# Patient Record
Sex: Female | Born: 1970 | Race: White | Hispanic: No | Marital: Married | Smoking: Never smoker
Health system: Southern US, Community
[De-identification: ages and names within clinical notes are randomized; demographics above are authoritative.]

## PROBLEM LIST (undated history)

## (undated) DIAGNOSIS — K649 Unspecified hemorrhoids: Secondary | ICD-10-CM

## (undated) DIAGNOSIS — J45909 Unspecified asthma, uncomplicated: Secondary | ICD-10-CM

## (undated) DIAGNOSIS — E236 Other disorders of pituitary gland: Secondary | ICD-10-CM

## (undated) DIAGNOSIS — Z3009 Encounter for other general counseling and advice on contraception: Secondary | ICD-10-CM

## (undated) DIAGNOSIS — K219 Gastro-esophageal reflux disease without esophagitis: Secondary | ICD-10-CM

## (undated) DIAGNOSIS — T7840XA Allergy, unspecified, initial encounter: Secondary | ICD-10-CM

## (undated) DIAGNOSIS — R519 Headache, unspecified: Secondary | ICD-10-CM

## (undated) DIAGNOSIS — K589 Irritable bowel syndrome without diarrhea: Secondary | ICD-10-CM

## (undated) DIAGNOSIS — E079 Disorder of thyroid, unspecified: Secondary | ICD-10-CM

## (undated) DIAGNOSIS — M797 Fibromyalgia: Secondary | ICD-10-CM

## (undated) DIAGNOSIS — L600 Ingrowing nail: Secondary | ICD-10-CM

## (undated) DIAGNOSIS — R011 Cardiac murmur, unspecified: Secondary | ICD-10-CM

## (undated) HISTORY — DX: Cardiac murmur, unspecified: R01.1

## (undated) HISTORY — DX: Encounter for other general counseling and advice on contraception: Z30.09

## (undated) HISTORY — DX: Irritable bowel syndrome, unspecified: K58.9

## (undated) HISTORY — DX: Unspecified asthma, uncomplicated: J45.909

## (undated) HISTORY — DX: Ingrowing nail: L60.0

## (undated) HISTORY — PX: LAPAROSCOPIC TOTAL HYSTERECTOMY: SUR800

## (undated) HISTORY — DX: Disorder of thyroid, unspecified: E07.9

## (undated) HISTORY — DX: Fibromyalgia: M79.7

## (undated) HISTORY — PX: ABDOMINAL HYSTERECTOMY: SHX81

## (undated) HISTORY — DX: Gastro-esophageal reflux disease without esophagitis: K21.9

## (undated) HISTORY — DX: Allergy, unspecified, initial encounter: T78.40XA

## (undated) HISTORY — PX: KNEE ARTHROSCOPY: SUR90

## (undated) HISTORY — DX: Unspecified hemorrhoids: K64.9

---

## 2005-12-21 ENCOUNTER — Ambulatory Visit: Payer: Self-pay | Admitting: Unknown Physician Specialty

## 2008-03-11 ENCOUNTER — Inpatient Hospital Stay: Payer: Self-pay

## 2008-10-06 ENCOUNTER — Ambulatory Visit: Payer: Self-pay | Admitting: Family Medicine

## 2010-08-25 ENCOUNTER — Inpatient Hospital Stay: Payer: Self-pay | Admitting: Obstetrics & Gynecology

## 2012-08-09 ENCOUNTER — Ambulatory Visit: Payer: Self-pay | Admitting: Orthopedic Surgery

## 2012-09-18 ENCOUNTER — Ambulatory Visit: Payer: Self-pay | Admitting: Orthopedic Surgery

## 2013-02-17 ENCOUNTER — Ambulatory Visit: Payer: Self-pay | Admitting: Obstetrics and Gynecology

## 2013-08-29 LAB — HM PAP SMEAR: HM Pap smear: NEGATIVE

## 2013-11-07 ENCOUNTER — Emergency Department: Payer: Self-pay | Admitting: Emergency Medicine

## 2013-11-11 ENCOUNTER — Ambulatory Visit: Payer: Self-pay | Admitting: Family Medicine

## 2013-12-23 LAB — LIPID PANEL
CHOLESTEROL: 200 mg/dL (ref 0–200)
HDL: 60 mg/dL (ref 35–70)
LDL Cholesterol: 121 mg/dL
Triglycerides: 94 mg/dL (ref 40–160)

## 2013-12-23 LAB — HEPATIC FUNCTION PANEL
ALT: 11 U/L (ref 7–35)
AST: 17 U/L (ref 13–35)

## 2013-12-23 LAB — CBC AND DIFFERENTIAL
HCT: 38 % (ref 36–46)
HEMOGLOBIN: 12.9 g/dL (ref 12.0–16.0)
Platelets: 213 10*3/uL (ref 150–399)
WBC: 4.2 10*3/mL

## 2013-12-23 LAB — BASIC METABOLIC PANEL
BUN: 13 mg/dL (ref 4–21)
Creatinine: 0.8 mg/dL (ref 0.5–1.1)
GLUCOSE: 79 mg/dL
POTASSIUM: 4.5 mmol/L (ref 3.4–5.3)
Sodium: 137 mmol/L (ref 137–147)

## 2013-12-23 LAB — TSH: TSH: 4.13 u[IU]/mL (ref 0.41–5.90)

## 2014-02-18 ENCOUNTER — Ambulatory Visit: Payer: Self-pay | Admitting: Obstetrics & Gynecology

## 2014-02-18 LAB — CBC
HCT: 37.6 % (ref 35.0–47.0)
HGB: 13 g/dL (ref 12.0–16.0)
MCH: 30.3 pg (ref 26.0–34.0)
MCHC: 34.6 g/dL (ref 32.0–36.0)
MCV: 87 fL (ref 80–100)
PLATELETS: 205 10*3/uL (ref 150–440)
RBC: 4.3 10*6/uL (ref 3.80–5.20)
RDW: 12.3 % (ref 11.5–14.5)
WBC: 5.2 10*3/uL (ref 3.6–11.0)

## 2014-02-26 ENCOUNTER — Ambulatory Visit: Payer: Self-pay | Admitting: Obstetrics & Gynecology

## 2014-03-01 LAB — PATHOLOGY REPORT

## 2014-06-28 ENCOUNTER — Ambulatory Visit: Payer: Self-pay | Admitting: Podiatry

## 2014-07-02 ENCOUNTER — Ambulatory Visit: Payer: 59 | Admitting: Podiatry

## 2014-07-09 ENCOUNTER — Encounter: Payer: Self-pay | Admitting: Podiatry

## 2014-07-09 ENCOUNTER — Ambulatory Visit (INDEPENDENT_AMBULATORY_CARE_PROVIDER_SITE_OTHER): Payer: 59 | Admitting: Podiatry

## 2014-07-09 ENCOUNTER — Ambulatory Visit (INDEPENDENT_AMBULATORY_CARE_PROVIDER_SITE_OTHER): Payer: 59

## 2014-07-09 VITALS — BP 105/63 | HR 79 | Resp 16 | Ht 64.0 in | Wt 129.0 lb

## 2014-07-09 DIAGNOSIS — M21611 Bunion of right foot: Secondary | ICD-10-CM

## 2014-07-09 DIAGNOSIS — M21619 Bunion of unspecified foot: Secondary | ICD-10-CM

## 2014-07-09 DIAGNOSIS — M2020 Hallux rigidus, unspecified foot: Secondary | ICD-10-CM

## 2014-07-09 DIAGNOSIS — M79609 Pain in unspecified limb: Secondary | ICD-10-CM

## 2014-07-09 DIAGNOSIS — M2021 Hallux rigidus, right foot: Secondary | ICD-10-CM

## 2014-07-09 DIAGNOSIS — L600 Ingrowing nail: Secondary | ICD-10-CM

## 2014-07-09 NOTE — Progress Notes (Signed)
   Subjective:    Patient ID: Brenda Price, female    DOB: May 14, 1971, 43 y.o.   MRN: [id number]  HPI Comments: Right great toenail seems to be a sore and painful. Right great medial corner has been removed before , seems to hurt more as i am on it, closed in shoes seem to aggravate it. It has been bothering me since October of last year, it has got worse   Foot Pain Associated symptoms include headaches.      Review of Systems  HENT:       Sinus problems  Sore throat  Ringing in ears   Eyes: Positive for pain, redness and itching.  Respiratory: Positive for chest tightness.   Gastrointestinal:       Bloating   Constipation   Allergic/Immunologic: Positive for environmental allergies.  Neurological: Positive for headaches.  All other systems reviewed and are negative.      Objective:   Physical Exam        Assessment & Plan:

## 2014-07-09 NOTE — Progress Notes (Signed)
Subjective:     Patient ID: Brenda Price, female   DOB: 09-25-1971, 43 y.o.   MRN: [id number]  Foot Pain   patient presents stating that she had an ingrown toenail removed in the last year and that it's been giving her trouble since and she just seems to get pain around the toe in big toe joint  Review of Systems  All other systems reviewed and are negative.      Objective:   Physical Exam  Nursing note and vitals reviewed. Constitutional: She is oriented to person, place, and time.  Cardiovascular: Intact distal pulses.   Musculoskeletal: Normal range of motion.  Neurological: She is oriented to person, place, and time.  Skin: Skin is warm.   neurovascular status intact with muscle strength adequate and range of motion subtalar and midtarsal joint within normal limits. Patient's found to have an incurvated medial border right hallux was in thick tissue formation and obvious scar tissue and it is moderately discomforting when pressed and also discomfort in the first metatarsophalangeal joint lateral side with indications of functional or structural hallux limitus deformity. Digits are found to be well perfused and patient is well oriented x3     Assessment:     Probable damage to the right hallux medial border with scar tissue formation creating pain and possibility that part of the pain is related to discomfort in the metatarsal phalangeal joint    Plan:     H&P and x-rays reviewed. Proximal nerve block of the right toe 60 mg Xylocaine Marcaine mixture was administered and discussed and explained risk associated with removing this corner again. Patient wants procedure and today I removed the right hallux medial border exposed the matrix and apply chemical phenol 3 applications followed by alcohol lavaged and sterile dressing. Gave instructions for soaks and reappoint and we are going to consider injection of the big toe joint and possible other treatments depending on response

## 2014-07-09 NOTE — Patient Instructions (Signed)

## 2014-08-10 ENCOUNTER — Ambulatory Visit (INDEPENDENT_AMBULATORY_CARE_PROVIDER_SITE_OTHER): Payer: 59 | Admitting: Podiatry

## 2014-08-10 ENCOUNTER — Encounter: Payer: Self-pay | Admitting: Podiatry

## 2014-08-10 VITALS — BP 123/75 | HR 76 | Resp 16

## 2014-08-10 DIAGNOSIS — L03039 Cellulitis of unspecified toe: Secondary | ICD-10-CM

## 2014-08-10 MED ORDER — CEPHALEXIN 500 MG PO CAPS: 500.0000 mg | ORAL_CAPSULE | Freq: Two times a day (BID) | ORAL | Status: DC

## 2014-08-10 NOTE — Progress Notes (Signed)
Subjective:     Patient ID: Brenda Price, female   DOB: 1971-01-26, 43 y.o.   MRN: [id number]  HPI patient states that I had this ingrown toenail removed about a month ago and I was just concerned because it started to turn red and draining at the bottom portion of it. I haven't noticed anything else currently   Review of Systems     Objective:   Physical Exam Neurovascular status intact with patient well oriented x3 and noted to have a small amount of crusted tissue in the proximal portion of the right hallux where remove the nail corner. It is localized with no proximal edema erythema or drainage noted. The pain she was exhibiting prior to surgery has resolved    Assessment:     Possible localized paronychia secondary to the area healing and a small amount of bacteria which may have incurvated this area local    Plan:     Educated patient on this and placed on cephalexin 500 mg twice a day and gave instructions on soaks. If symptoms were to persist or get worse she is to let us know immediately

## 2014-08-20 ENCOUNTER — Ambulatory Visit: Payer: 59 | Admitting: Podiatry

## 2014-11-09 ENCOUNTER — Ambulatory Visit (INDEPENDENT_AMBULATORY_CARE_PROVIDER_SITE_OTHER): Payer: 59

## 2014-11-09 ENCOUNTER — Ambulatory Visit (INDEPENDENT_AMBULATORY_CARE_PROVIDER_SITE_OTHER): Payer: 59 | Admitting: Podiatry

## 2014-11-09 VITALS — BP 113/64 | HR 75 | Resp 16

## 2014-11-09 DIAGNOSIS — G90521 Complex regional pain syndrome I of right lower limb: Secondary | ICD-10-CM

## 2014-11-09 DIAGNOSIS — M779 Enthesopathy, unspecified: Secondary | ICD-10-CM

## 2014-11-09 MED ORDER — GABAPENTIN 300 MG PO CAPS: 300.0000 mg | ORAL_CAPSULE | Freq: Two times a day (BID) | ORAL | Status: DC

## 2014-11-10 NOTE — Progress Notes (Signed)
Subjective:     Patient ID: Brenda Price, female   DOB: 05-05-1971, 43 y.o.   MRN: [id number]  HPI patient states that if she exercises or is real active on her foot that her toes turn red and become moderately irritated and it is slightly limiting for her. The ingrown toenail that we fixed is doing fine and no longer giving her any pain and she does have a history of a traumatic ankle sprain right and had developed this persistent-type pain after she was immobilized which was approximately 1 year ago   Review of Systems     Objective:   Physical Exam Neurovascular status was intact with no skin mottling noted no abnormal discoloration or clonus to the digits noted. 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 She had been on low dosage gabapentin which was effective for her previously and I have recommended we resume this and hopefully we can get her off of it within a couple months. I explained the symptoms and signs of RSD S and if any worsening or other issues should occur I like to know right away and at this time I placed her on 300 mg gabapentin twice a day

## 2015-03-05 NOTE — Op Note (Signed)
PATIENT NAME:  Brenda Price, Brenda Price MR#:  [id number] DATE OF BIRTH:  1971/02/29  DATE OF PROCEDURE:  02/26/2014  PREOPERATIVE DIAGNOSIS: Abnormal uterine bleeding.   POSTOPERATIVE DIAGNOSIS: Abnormal uterine bleeding.   PROCEDURE: Complete laparoscopic hysterectomy, bilateral salpingectomy.   SURGEON: Glean Salen, MD  ASSISTANT: Stoney Bang. Georgianne Fick, MD   ANESTHESIA: General.   ESTIMATED BLOOD LOSS: 100 mL.   COMPLICATIONS: None.   FINDINGS: Normal ovaries.   DISPOSITION: To recovery room stable.   TECHNIQUE: The patient is prepped and draped in the usual sterile fashion after adequate anesthesia is obtained in the dorsal lithotomy position. A Foley catheter is inserted. A V-Care device is placed on the cervix for manipulation purposes and identification of the cervix during the procedure.   Attention is then turned to abdomen, where an infraumbilical incision is made down to the level of the rectus fascia. Veress needle is inserted, with Veress needle placement confirmed using the hanging drop technique. The abdomen is then insufflated with CO2 gas. The fascia was then tented anteriorly and incised using Metzenbaum scissors. Careful examination reveals no adhesions or bowel stuck in this area. The Billy Fischer is then placed through this fascial incision into the intra-abdominal cavity for use for single-site laparoscopic procedure. Once it is cinched into place, the cap is applied that has multiple ports for laparoscopic instrumentation. A 5 mm laparoscope that has a flexible head is then inserted to visualize the intra-abdominal cavity and its organs. The patient is placed in Trendelenburg positioning, and the above-mentioned findings are visualized.   The laparoscopic instruments were placed, and the right fallopian tube is grasped and carefully dissected free using the 5 mm Harmonic scalpel down to the level of the uterus. The uterine ovarian blood vessels and ligaments are then carefully  cut, with preservation of the ovary and its main blood supply. The round ligament is also coagulated and cut, and dissection is carried down to the level of the uterine arteries. The uterine arteries were coagulated with bipolar Kleppinger device. The left fallopian tube is grasped and also dissected free using the 5 mm Harmonic scalpel, and then the uterine ovarian blood vessels and their ligaments along with the round ligaments and broad ligament complex are all coagulated and cut using the Harmonic scalpel down to the level of the uterine arteries. The left ovary is preserved along with its blood supply. The uterine arteries are then coagulated and cut, and the uterus appears to blanch. The cervicovaginal junction is identified with tenting of the tissues using the V-Care device from below, and a careful circumferential incision is made with the Harmonic scalpel to completely amputate the uterus along with the cervix. Once this is completed, it is removed vaginally, and a sponge is placed in the vagina to maintain the pneumoperitoneum.   Laparoscopic sutures applied using a 0 Polysorb suture across the vaginal cuff in a running fashion. The pelvic cavity is irrigated with aspiration of blood clot, and excellent hemostasis is noted at all sites. There is no apparent injury to bladder, bowel, ureter or colon. Appendix, gallbladder and liver are also visualized to be normal. The patient is leveled, gas is expelled and the trocars removed through the umbilical site. The rectus fascia is closed with 0 Vicryl suture. A 4-0 Vicryl suture is used to close the subcutaneous and subcuticular layer, and then Dermabond is applied to the skin, followed by a bandage.   Vaginal exam is performed with a speculum, and the vaginal mucosa is further closed  with a 2-0 Vicryl suture in a running fashion across the cuff for complete mucosal closure. Vaginal cavity is irrigated, and no bleeding is noted. Foley catheter is left in  place, and the patient goes to recovery in stable condition. All sponge, instrument and needle counts are correct.   ____________________________ R. Barnett Applebaum, MD rph:lb D: 02/26/2014 13:25:12 ET T: 02/26/2014 13:56:17 ET JOB#: [id number]  cc: Glean Salen, MD, <Dictator> Gae Dry MD ELECTRONICALLY SIGNED 02/27/2014 8:56

## 2015-04-21 ENCOUNTER — Other Ambulatory Visit: Payer: Self-pay | Admitting: Family Medicine

## 2015-04-21 DIAGNOSIS — E039 Hypothyroidism, unspecified: Secondary | ICD-10-CM

## 2015-05-16 ENCOUNTER — Other Ambulatory Visit: Payer: Self-pay | Admitting: Family Medicine

## 2015-08-29 ENCOUNTER — Encounter: Payer: Self-pay | Admitting: Family Medicine

## 2015-08-29 ENCOUNTER — Telehealth: Payer: Self-pay | Admitting: Family Medicine

## 2015-08-29 ENCOUNTER — Ambulatory Visit (INDEPENDENT_AMBULATORY_CARE_PROVIDER_SITE_OTHER): Payer: 59 | Admitting: Family Medicine

## 2015-08-29 VITALS — BP 108/74 | HR 66 | Temp 97.7°F | Resp 16 | Ht 64.0 in | Wt 131.0 lb

## 2015-08-29 DIAGNOSIS — J45901 Unspecified asthma with (acute) exacerbation: Secondary | ICD-10-CM | POA: Diagnosis not present

## 2015-08-29 DIAGNOSIS — R42 Dizziness and giddiness: Secondary | ICD-10-CM

## 2015-08-29 DIAGNOSIS — J45909 Unspecified asthma, uncomplicated: Secondary | ICD-10-CM | POA: Insufficient documentation

## 2015-08-29 MED ORDER — MECLIZINE HCL 25 MG PO TABS: 25.0000 mg | ORAL_TABLET | Freq: Three times a day (TID) | ORAL | Status: DC | PRN

## 2015-08-29 MED ORDER — MECLIZINE HCL 32 MG PO TABS: 32.0000 mg | ORAL_TABLET | Freq: Three times a day (TID) | ORAL | Status: DC | PRN

## 2015-08-29 MED ORDER — PREDNISONE 10 MG PO TABS: ORAL_TABLET | ORAL | Status: DC

## 2015-08-29 NOTE — Telephone Encounter (Signed)
Please call. Sent in new rx. Thanks.

## 2015-08-29 NOTE — Progress Notes (Signed)
Subjective:    Patient ID: Brenda Price, female    DOB: 1971/10/31, 44 y.o.   MRN: [id number]  Shortness of Breath This is a recurrent problem. The current episode started in the past 7 days. The problem occurs intermittently. The problem has been unchanged. Associated symptoms include abdominal pain, ear pain, headaches, leg pain ("achy"), neck pain, rhinorrhea and a sore throat. Pertinent negatives include no chest pain, claudication, fever, hemoptysis, leg swelling, orthopnea, rash, sputum production, swollen glands, syncope, vomiting or wheezing. Treatments tried: Benadryl, [employer]. The treatment provided mild relief. Her past medical history is significant for allergies, asthma and pneumonia.  Dizziness The current episode started more than 1 year ago. The problem has been gradually worsening (Feels like has to hold on stainding and talking. Comes and goes.  ). Associated symptoms include abdominal pain, arthralgias, chills, congestion, coughing, fatigue, headaches, myalgias, nausea, neck pain, a sore throat, vertigo and weakness. Pertinent negatives include no anorexia, change in bowel habit, chest pain, diaphoresis, fever, joint swelling, numbness, rash, swollen glands, urinary symptoms, visual change or vomiting. The symptoms are aggravated by standing (Worse this week Feels like room is spinning. ). She has tried nothing for the symptoms.      Review of Systems  Constitutional: Positive for chills and fatigue. Negative for fever and diaphoresis.  HENT: Positive for congestion, ear pain, rhinorrhea and sore throat.   Respiratory: Positive for cough and shortness of breath. Negative for hemoptysis, sputum production and wheezing.   Cardiovascular: Negative for chest pain, orthopnea, claudication, leg swelling and syncope.  Gastrointestinal: Positive for nausea and abdominal pain. Negative for vomiting, anorexia and change in bowel habit.  Musculoskeletal: Positive for myalgias,  arthralgias and neck pain. Negative for joint swelling.  Skin: Negative for rash.  Neurological: Positive for dizziness, vertigo, weakness and headaches. Negative for numbness.   BP 108/74 mmHg  Pulse 66  Temp(Src) 97.7 F (36.5 C) (Oral)  Resp 16  Ht 5\' 4"  (1.626 m)  Wt 131 lb (59.421 kg)  BMI 22.47 kg/m2  SpO2 99%   There are no active problems to display for this patient.  Past Medical History  Diagnosis Date  . Asthma   . Thyroid disease   . Allergy   . IBS (irritable bowel syndrome)   . Heart murmur   . Fibromyalgia    Current Outpatient Prescriptions on File Prior to Visit  Medication Sig  . cetirizine (ZYRTEC) 10 MG tablet Take 10 mg by mouth daily.  . cyclobenzaprine (FLEXERIL) 5 MG tablet   . levocetirizine (XYZAL) 5 MG tablet Take 2.5 mg by mouth every evening.  Marland Kitchen levothyroxine (SYNTHROID, LEVOTHROID) 50 MCG tablet Take 1 tablet by mouth  daily  . montelukast (SINGULAIR) 10 MG tablet Take 1 tablet by mouth  daily  . PROAIR HFA 108 (90 BASE) MCG/ACT inhaler    No current facility-administered medications on file prior to visit.   Allergies  Allergen Reactions  . Aspirin Other (See Comments)    Causes asthma to act up   . Erythromycin Other (See Comments)    Stomach cramping    Past Surgical History  Procedure Laterality Date  . Abdominal hysterectomy    . Knee arthroscopy    . Cesarean section     Social History   Social History  . Marital Status: Married    Spouse Name: N/A  . Number of Children: N/A  . Years of Education: N/A   Occupational History  . Not on file.  Social History Main Topics  . Smoking status: Never Smoker   . Smokeless tobacco: Never Used  . Alcohol Use: Yes     Comment: occasional  . Drug Use: No  . Sexual Activity: Not on file   Other Topics Concern  . Not on file   Social History Narrative   Family History  Problem Relation Age of Onset  . Heart attack Father   . Arthritis Mother   . Hyperlipidemia Mother    . Thyroid disease Brother      .result     Objective:   Physical Exam  Constitutional: She is oriented to person, place, and time. She appears well-developed and well-nourished.  HENT:  Head: Normocephalic and atraumatic.  Right Ear: External ear normal.  Left Ear: External ear normal.  Mouth/Throat: Oropharynx is clear and moist.  Eyes: Conjunctivae and EOM are normal. Pupils are equal, round, and reactive to light.  Neck: Normal range of motion. Neck supple.  Cardiovascular: Normal rate and regular rhythm.   Pulmonary/Chest: Effort normal and breath sounds normal.  Neurological: She is alert and oriented to person, place, and time. No cranial nerve deficit. Coordination normal.  Can not reproduce dizziness with head maneuvers.   Psychiatric: She has a normal mood and affect. Her behavior is normal. Judgment and thought content normal.    BP 108/74 mmHg  Pulse 66  Temp(Src) 97.7 F (36.5 C) (Oral)  Resp 16  Ht 5\' 4"  (1.626 m)  Wt 131 lb (59.421 kg)  BMI 22.47 kg/m2  SpO2 99%       Assessment & Plan:  1. Asthma exacerbation New problem.  Will treat. Call if not improving, especially if fever or other systemic symptoms.   - predniSONE (DELTASONE) 10 MG tablet; 6 po for 2 days and then 5 po for 2 days and then 4 po for 2 days and 3 po for 2 days and then 2 po for 2 days and then 1 po for 2 days.  Dispense: 42 tablet; Refill: 0  2. Vertigo New problem.  Will treat as below. If does not improve with steroids or worsens, will need referral to ENT.  Patient will call if needed   - meclizine (ANTIVERT) 32 MG tablet; Take 1 tablet (32 mg total) by mouth 3 (three) times daily as needed.  Dispense: 30 tablet; Refill: 0   Margarita Rana, MD

## 2015-08-29 NOTE — Telephone Encounter (Signed)
Brenda Price stated that the meclizine (ANTIVERT) 32 MG tablet doesn't come in 32 mg it only comes in 12.5 or 25 mg. Brenda Price would like a call back to be advise which dose Dr. Venia Minks would  like it changed too. Thanks TNP

## 2015-09-29 DIAGNOSIS — N920 Excessive and frequent menstruation with regular cycle: Secondary | ICD-10-CM | POA: Insufficient documentation

## 2015-09-29 DIAGNOSIS — K219 Gastro-esophageal reflux disease without esophagitis: Secondary | ICD-10-CM | POA: Insufficient documentation

## 2015-09-29 DIAGNOSIS — L600 Ingrowing nail: Secondary | ICD-10-CM

## 2015-09-29 DIAGNOSIS — R011 Cardiac murmur, unspecified: Secondary | ICD-10-CM

## 2015-09-29 DIAGNOSIS — J302 Other seasonal allergic rhinitis: Secondary | ICD-10-CM | POA: Insufficient documentation

## 2015-09-29 DIAGNOSIS — Z3009 Encounter for other general counseling and advice on contraception: Secondary | ICD-10-CM

## 2015-09-29 DIAGNOSIS — I341 Nonrheumatic mitral (valve) prolapse: Secondary | ICD-10-CM | POA: Insufficient documentation

## 2015-09-29 DIAGNOSIS — K599 Functional intestinal disorder, unspecified: Secondary | ICD-10-CM | POA: Insufficient documentation

## 2015-09-29 DIAGNOSIS — K589 Irritable bowel syndrome without diarrhea: Secondary | ICD-10-CM | POA: Insufficient documentation

## 2015-09-29 DIAGNOSIS — K649 Unspecified hemorrhoids: Secondary | ICD-10-CM | POA: Insufficient documentation

## 2015-09-29 DIAGNOSIS — B351 Tinea unguium: Secondary | ICD-10-CM | POA: Insufficient documentation

## 2015-09-29 DIAGNOSIS — G43909 Migraine, unspecified, not intractable, without status migrainosus: Secondary | ICD-10-CM | POA: Insufficient documentation

## 2015-09-29 DIAGNOSIS — M542 Cervicalgia: Secondary | ICD-10-CM | POA: Insufficient documentation

## 2015-09-29 DIAGNOSIS — D709 Neutropenia, unspecified: Secondary | ICD-10-CM | POA: Insufficient documentation

## 2015-09-29 DIAGNOSIS — E789 Disorder of lipoprotein metabolism, unspecified: Secondary | ICD-10-CM | POA: Insufficient documentation

## 2015-09-29 DIAGNOSIS — E039 Hypothyroidism, unspecified: Secondary | ICD-10-CM | POA: Insufficient documentation

## 2015-09-29 HISTORY — DX: Encounter for other general counseling and advice on contraception: Z30.09

## 2015-09-29 HISTORY — DX: Ingrowing nail: L60.0

## 2015-09-29 HISTORY — DX: Unspecified hemorrhoids: K64.9

## 2015-09-29 HISTORY — DX: Cardiac murmur, unspecified: R01.1

## 2015-10-04 ENCOUNTER — Ambulatory Visit (INDEPENDENT_AMBULATORY_CARE_PROVIDER_SITE_OTHER): Payer: 59 | Admitting: Family Medicine

## 2015-10-04 ENCOUNTER — Other Ambulatory Visit: Payer: Self-pay

## 2015-10-04 VITALS — BP 102/70 | HR 68 | Temp 98.5°F | Resp 16 | Ht 64.0 in | Wt 133.0 lb

## 2015-10-04 DIAGNOSIS — E039 Hypothyroidism, unspecified: Secondary | ICD-10-CM | POA: Diagnosis not present

## 2015-10-04 DIAGNOSIS — G90521 Complex regional pain syndrome I of right lower limb: Secondary | ICD-10-CM

## 2015-10-04 DIAGNOSIS — E789 Disorder of lipoprotein metabolism, unspecified: Secondary | ICD-10-CM

## 2015-10-04 DIAGNOSIS — Z0000 Encounter for general adult medical examination without abnormal findings: Secondary | ICD-10-CM

## 2015-10-04 MED ORDER — GABAPENTIN 300 MG PO CAPS: 300.0000 mg | ORAL_CAPSULE | Freq: Two times a day (BID) | ORAL | Status: DC

## 2015-10-04 NOTE — Progress Notes (Signed)
Patient: Brenda Price, Female    DOB: Oct 14, 1971, 44 y.o.   MRN: [id number] Visit Date: 10/04/2015  Today's Provider: Margarita Rana, MD   Chief Complaint  Patient presents with  . Annual Exam  . Foot Pain   Subjective:    Annual physical exam FLOREAN KERCHEVAL is a 44 y.o. female who presents today for health maintenance and complete physical. She feels fairly well. She reports exercising 3-5 days weekly; treadmill and yoga. She reports she is sleeping well.  Last Pap- 08/19/2013- Negative. HPV negative Last mammogram- thru [facility]. Normal per pt -----------------------------------------------------------------  Foot pain Pt injured right foot about 3 years ago. Pt sprained ankle, and still is experiencing residual pain from the injury. Pt reports podiatry started pt on Gabapentin (unknown dose), with relief. Pt is requesting for PCP to refill this medication.   Review of Systems  Constitutional: Positive for fatigue.  HENT: Positive for postnasal drip, rhinorrhea and sinus pressure.   Eyes: Positive for redness and itching.  Respiratory: Positive for chest tightness and shortness of breath.   Gastrointestinal: Positive for nausea, constipation and abdominal distention.  Musculoskeletal: Positive for myalgias, arthralgias, neck pain and neck stiffness.  Allergic/Immunologic: Positive for environmental allergies.  Neurological: Positive for dizziness and headaches.  All other systems reviewed and are negative.   Social History She  reports that she has never smoked. She has never used smokeless tobacco. She reports that she drinks alcohol. She reports that she does not use illicit drugs. Social History   Social History  . Marital Status: Married    Spouse Name: N/A  . Number of Children: N/A  . Years of Education: N/A   Social History Main Topics  . Smoking status: Never Smoker   . Smokeless tobacco: Never Used  . Alcohol Use: Yes     Comment: occasional    . Drug Use: No  . Sexual Activity: Not on file   Other Topics Concern  . Not on file   Social History Narrative    Patient Active Problem List   Diagnosis Date Noted  . 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 Family planning 09/29/2015  . Acid reflux 09/29/2015  . Cardiac murmur 09/29/2015  . Excess, menstruation 09/29/2015  . Hemorrhoid 09/29/2015  . 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 Embedded toenail 09/29/2015  . Irregular bleeding 09/29/2015  . Headache, migraine 09/29/2015  . 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 Cervical pain 09/29/2015  . Neutropenia ([place]) 09/29/2015  . Fungal infection of nail 09/29/2015  . Allergic rhinitis, seasonal 09/29/2015  . Asthma exacerbation 08/29/2015    Past Surgical History  Procedure Laterality Date  . Abdominal hysterectomy    . Knee arthroscopy    . Cesarean section      Family History  Family Status  Relation Status Death Age  . Father Deceased   . Mother Alive   . Sister Alive   . Brother Alive    Her family history includes Arthritis in her mother; Heart attack in her father; Hyperlipidemia in her mother; Thyroid disease in her brother.    Allergies  Allergen Reactions  . Aspirin Other (See Comments)    Causes asthma to act up   . Erythromycin Other (See Comments)    Stomach cramping     Previous Medications   BACLOFEN (LIORESAL) 10 MG TABLET    Take by mouth.   CETIRIZINE (ZYRTEC) 10 MG TABLET    Take  10 mg by mouth daily.   FLUVIRIN SUSP    ADM 0.5ML IM UTD   LEVOCETIRIZINE (XYZAL) 5 MG TABLET    Take 2.5 mg by mouth every evening.   LEVOTHYROXINE (SYNTHROID, LEVOTHROID) 50 MCG TABLET    Take 1 tablet by mouth  daily   MONTELUKAST (SINGULAIR) 10 MG TABLET    Take 1 tablet by mouth  daily   OMEPRAZOLE (PRILOSEC) 20 MG CAPSULE    Take 20 mg by mouth daily.   PROAIR HFA 108 (68 BASE) MCG/ACT INHALER        Patient Care Team: Margarita Rana, MD as PCP - General (Family  Medicine) Margarita Rana, MD (Family Medicine)     Objective:   Vitals: BP 102/70 mmHg  Pulse 68  Temp(Src) 98.5 F (36.9 C) (Oral)  Resp 16  Ht 5\' 4"  (1.626 m)  Wt 133 lb (60.328 kg)  BMI 22.82 kg/m2  SpO2 97%   Physical Exam  Constitutional: She is oriented to person, place, and time. She appears well-developed and well-nourished.  HENT:  Head: Normocephalic and atraumatic.  Right Ear: Tympanic membrane, external ear and ear canal normal.  Left Ear: Tympanic membrane, external ear and ear canal normal.  Nose: Nose normal.  Mouth/Throat: Uvula is midline, oropharynx is clear and moist and mucous membranes are normal.  Eyes: Conjunctivae, EOM and lids are normal. Pupils are equal, round, and reactive to light.  Neck: Trachea normal and normal range of motion. Neck supple. Carotid bruit is not present. No thyroid mass and no thyromegaly present.  Cardiovascular: Normal rate, regular rhythm and normal heart sounds.   Pulmonary/Chest: Effort normal and breath sounds normal.  Abdominal: Soft. Normal appearance and bowel sounds are normal. There is no hepatosplenomegaly. There is no tenderness.  Musculoskeletal: Normal range of motion.  Lymphadenopathy:    She has no cervical adenopathy.    She has no axillary adenopathy.  Neurological: She is alert and oriented to person, place, and time. She has normal strength. No cranial nerve deficit.  Skin: Skin is warm, dry and intact.  Psychiatric: She has a normal mood and affect. Her speech is normal and behavior is normal. Judgment and thought content normal. Cognition and memory are normal.     Depression Screen PHQ 2/9 Scores 10/04/2015  PHQ - 2 Score 0      Assessment & Plan:     Routine Health Maintenance and Physical Exam  Exercise Activities and Dietary recommendations Goals    None      Immunization History  Administered Date(s) Administered  . Influenza,inj,Quad PF,36+ Mos 09/05/2015  . Td 07/07/2002  . Tdap  07/07/2012    Health Maintenance  Topic Date Due  . HIV Screening  10/23/1987  . INFLUENZA VACCINE  06/12/2016  . PAP SMEAR  08/29/2016  . TETANUS/TDAP  07/07/2022      Discussed health benefits of physical activity, and encouraged her to engage in regular exercise appropriate for her age and condition.    --------------------------------------------------------------------  1. Annual physical exam Stable. Continue healthy lifestyle. 2. Complex regional pain syndrome of lower limb, right Start Gabapentin for foot pain as below. - gabapentin (NEURONTIN) 300 MG capsule; Take 1 capsule (300 mg total) by mouth 2 (two) times daily.  Dispense: 60 capsule; Refill: 3  3. Hypothyroidism, unspecified hypothyroidism type FU pending labs. - TSH  4. Disease of lipid metabolism FU pending labs. - CBC with Differential/Platelet - Comprehensive metabolic panel - Lipid panel  Patient seen and examined  by Jerrell Belfast, MD, and note scribed by Renaldo Fiddler, CMA.  I have reviewed the document for accuracy and completeness and I agree with above. Jerrell Belfast, MD   Margarita Rana, MD

## 2015-10-05 ENCOUNTER — Encounter: Payer: Self-pay | Admitting: Family Medicine

## 2015-10-17 ENCOUNTER — Ambulatory Visit (INDEPENDENT_AMBULATORY_CARE_PROVIDER_SITE_OTHER): Payer: 59 | Admitting: Family Medicine

## 2015-10-17 ENCOUNTER — Encounter: Payer: Self-pay | Admitting: Family Medicine

## 2015-10-17 VITALS — BP 110/72 | HR 69 | Temp 97.5°F | Resp 16 | Ht 64.0 in | Wt 134.0 lb

## 2015-10-17 DIAGNOSIS — J0100 Acute maxillary sinusitis, unspecified: Secondary | ICD-10-CM | POA: Diagnosis not present

## 2015-10-17 DIAGNOSIS — J4521 Mild intermittent asthma with (acute) exacerbation: Secondary | ICD-10-CM | POA: Diagnosis not present

## 2015-10-17 MED ORDER — AMOXICILLIN-POT CLAVULANATE 875-125 MG PO TABS: 1.0000 | ORAL_TABLET | Freq: Two times a day (BID) | ORAL | Status: DC

## 2015-10-17 MED ORDER — PREDNISONE 20 MG PO TABS: ORAL_TABLET | ORAL | Status: DC

## 2015-10-17 MED ORDER — HYDROCODONE-HOMATROPINE 5-1.5 MG/5ML PO SYRP: ORAL_SOLUTION | ORAL | Status: DC

## 2015-10-17 NOTE — Progress Notes (Signed)
Subjective:     Patient ID: Brenda Price, female   DOB: 13-Aug-1971, 44 y.o.   MRN: [id number]  HPI  Chief Complaint  Patient presents with  . Cough    Patient comes in office today with concerns of cough and congestion since 11/26. Patient states that she hass a productive cough with phlegm and she has had burning in her chest, patient reports cough has got worse since 12/1. Patient states that she has hd sinus pain and pressure but denies fever. Patient has taken otc Day/Night Quil and Robitussin  Patient reports increased sinus pressure, purulent sinus drainage, post nasal drainage and accompanying cough.States she has been using her albuterol inhaler with little improvement in her cough.   Review of Systems  Constitutional: Negative for fever and chills.       Objective:   Physical Exam  Constitutional: She appears well-developed and well-nourished. No distress.  Ears: T.M's intact without inflammation Sinuses: mild maxillary sinus tenderness Throat: no tonsillar enlargement or exudate Neck: no cervical adenopathy Lungs: clear     Assessment:    1. Acute maxillary sinusitis, recurrence not specified - amoxicillin-clavulanate (AUGMENTIN) 875-125 MG tablet; Take 1 tablet by mouth 2 (two) times daily.  Dispense: 20 tablet; Refill: 0 - HYDROcodone-homatropine (HYCODAN) 5-1.5 MG/5ML syrup; 5 ml 4-6 hours as needed for cough  Dispense: 240 mL; Refill: 0  2. Asthma, mild intermittent, with acute exacerbation - predniSONE (DELTASONE) 20 MG tablet; Taper as follows: 3 pills for 4 days, two pills for 4 days, one pill for four days  Dispense: 24 tablet; Refill: 0    Plan:    Discussed use of Mucinex D. To start prednisone for increased wheezing or shortness of breath.

## 2015-10-17 NOTE — Patient Instructions (Signed)
Discussed use of Mucinex D for congestion. 

## 2015-11-03 LAB — COMPREHENSIVE METABOLIC PANEL
A/G RATIO: 2 (ref 1.1–2.5)
ALK PHOS: 54 IU/L (ref 39–117)
ALT: 16 IU/L (ref 0–32)
AST: 20 IU/L (ref 0–40)
Albumin: 4.4 g/dL (ref 3.5–5.5)
BILIRUBIN TOTAL: 0.8 mg/dL (ref 0.0–1.2)
BUN / CREAT RATIO: 18 (ref 9–23)
BUN: 13 mg/dL (ref 6–24)
CALCIUM: 9.6 mg/dL (ref 8.7–10.2)
CO2: 25 mmol/L (ref 18–29)
CREATININE: 0.73 mg/dL (ref 0.57–1.00)
Chloride: 98 mmol/L (ref 96–106)
GFR calc Af Amer: 117 mL/min/{1.73_m2} (ref >59)
GFR, EST NON AFRICAN AMERICAN: 101 mL/min/{1.73_m2} (ref >59)
GLOBULIN, TOTAL: 2.2 g/dL (ref 1.5–4.5)
Glucose: 79 mg/dL (ref 65–99)
Potassium: 4.3 mmol/L (ref 3.5–5.2)
SODIUM: 139 mmol/L (ref 134–144)
Total Protein: 6.6 g/dL (ref 6.0–8.5)

## 2015-11-03 LAB — CBC WITH DIFFERENTIAL/PLATELET
BASOS: 1 %
Basophils Absolute: 0 10*3/uL (ref 0.0–0.2)
EOS (ABSOLUTE): 0.3 10*3/uL (ref 0.0–0.4)
EOS: 5 %
HEMATOCRIT: 39 % (ref 34.0–46.6)
Hemoglobin: 13.4 g/dL (ref 11.1–15.9)
IMMATURE GRANULOCYTES: 0 %
Immature Grans (Abs): 0 10*3/uL (ref 0.0–0.1)
LYMPHS ABS: 1.8 10*3/uL (ref 0.7–3.1)
Lymphs: 34 %
MCH: 29.9 pg (ref 26.6–33.0)
MCHC: 34.4 g/dL (ref 31.5–35.7)
MCV: 87 fL (ref 79–97)
MONOS ABS: 0.5 10*3/uL (ref 0.1–0.9)
Monocytes: 9 %
NEUTROS PCT: 51 %
Neutrophils Absolute: 2.6 10*3/uL (ref 1.4–7.0)
PLATELETS: 188 10*3/uL (ref 150–379)
RBC: 4.48 x10E6/uL (ref 3.77–5.28)
RDW: 14.2 % (ref 12.3–15.4)
WBC: 5.2 10*3/uL (ref 3.4–10.8)

## 2015-11-03 LAB — TSH: TSH: 2.05 u[IU]/mL (ref 0.450–4.500)

## 2015-11-03 LAB — LIPID PANEL
CHOL/HDL RATIO: 3.1 ratio (ref 0.0–4.4)
Cholesterol, Total: 210 mg/dL — ABNORMAL HIGH (ref 100–199)
HDL: 67 mg/dL (ref >39)
LDL CALC: 112 mg/dL — AB (ref 0–99)
Triglycerides: 157 mg/dL — ABNORMAL HIGH (ref 0–149)
VLDL CHOLESTEROL CAL: 31 mg/dL (ref 5–40)

## 2015-11-04 ENCOUNTER — Telehealth: Payer: Self-pay

## 2015-11-04 NOTE — Telephone Encounter (Signed)
Pt advised.   Thanks,   -Britain Saber  

## 2015-11-04 NOTE — Telephone Encounter (Signed)
-----   Message from Margarita Rana, MD sent at 11/03/2015  8:11 AM EST ----- Cholesterol is stable. Is mildly elevated at 210 but 10 year risk of heart disease is less than one percent. Treatment is not indicated. Thanks.

## 2016-02-02 ENCOUNTER — Ambulatory Visit (INDEPENDENT_AMBULATORY_CARE_PROVIDER_SITE_OTHER): Payer: 59 | Admitting: Physician Assistant

## 2016-02-02 ENCOUNTER — Encounter: Payer: Self-pay | Admitting: Physician Assistant

## 2016-02-02 VITALS — BP 118/70 | HR 78 | Temp 98.3°F | Resp 16 | Wt 133.0 lb

## 2016-02-02 DIAGNOSIS — T148 Other injury of unspecified body region: Secondary | ICD-10-CM

## 2016-02-02 DIAGNOSIS — W57XXXA Bitten or stung by nonvenomous insect and other nonvenomous arthropods, initial encounter: Secondary | ICD-10-CM

## 2016-02-02 MED ORDER — PREDNISONE 10 MG (21) PO TBPK: 10.0000 mg | ORAL_TABLET | Freq: Every day | ORAL | Status: DC

## 2016-02-02 NOTE — Progress Notes (Signed)
Patient: Brenda Price Female    DOB: 07-14-1971   45 y.o.   MRN: [id number] Visit Date: 02/02/2016  Today's Provider: Mar Daring, PA-C   Chief Complaint  Patient presents with  . Rash   Subjective:    Rash This is a new problem. The current episode started in the past 7 days (Sunday night). The problem is unchanged. The affected locations include the face, neck, left arm, right arm, left foot, right foot, left toes and right lowerleg. The rash is characterized by burning, itchiness, redness and peeling. Associated with: She was out in wood camping Saturday and Sunday. Associated symptoms include congestion. Pertinent negatives include no cough, eye pain, fatigue, fever, joint pain, shortness of breath or sore throat. Past treatments include antihistamine and anti-itch cream (Hydrocortisone 1%). The treatment provided no relief.       Allergies  Allergen Reactions  . Aspirin Other (See Comments)    Causes asthma to act up   . Erythromycin Other (See Comments)    Stomach cramping    Previous Medications   AMOXICILLIN-CLAVULANATE (AUGMENTIN) 875-125 MG TABLET    Take 1 tablet by mouth 2 (two) times daily.   BACLOFEN (LIORESAL) 10 MG TABLET    Take by mouth.   CETIRIZINE (ZYRTEC) 10 MG TABLET    Take 10 mg by mouth daily.   GABAPENTIN (NEURONTIN) 300 MG CAPSULE    Take 1 capsule (300 mg total) by mouth 2 (two) times daily.   HYDROCODONE-HOMATROPINE (HYCODAN) 5-1.5 MG/5ML SYRUP    5 ml 4-6 hours as needed for cough   LEVOCETIRIZINE (XYZAL) 5 MG TABLET    Take 2.5 mg by mouth every evening.   LEVOTHYROXINE (SYNTHROID, LEVOTHROID) 50 MCG TABLET    Take 1 tablet by mouth  daily   MONTELUKAST (SINGULAIR) 10 MG TABLET    Take 1 tablet by mouth  daily   OMEPRAZOLE (PRILOSEC) 20 MG CAPSULE    Take 20 mg by mouth daily.   PREDNISONE (DELTASONE) 20 MG TABLET    Taper as follows: 3 pills for 4 days, two pills for 4 days, one pill for four days   PROAIR HFA 108 (90 BASE)  MCG/ACT INHALER       TOPIRAMATE (TOPAMAX) 25 MG TABLET    Take 25 mg by mouth daily.    Review of Systems  Constitutional: Negative for fever and fatigue.  HENT: Positive for congestion. Negative for sore throat.   Eyes: Negative for pain.  Respiratory: Negative for cough and shortness of breath.   Cardiovascular: Negative.   Gastrointestinal: Negative.   Musculoskeletal: Negative for joint pain.  Skin: Positive for rash.    Social History  Substance Use Topics  . Smoking status: Never Smoker   . Smokeless tobacco: Never Used  . Alcohol Use: Yes     Comment: occasional   Objective:   BP 118/70 mmHg  Pulse 78  Temp(Src) 98.3 F (36.8 C) (Oral)  Resp 16  Wt 133 lb (60.328 kg)  Physical Exam  Constitutional: She appears well-developed and well-nourished. No distress.  Neck: Normal range of motion. Neck supple. No JVD present. No tracheal deviation present. No thyromegaly present.  Cardiovascular: Normal rate, regular rhythm and normal heart sounds.  Exam reveals no gallop and no friction rub.   No murmur heard. Pulmonary/Chest: Effort normal and breath sounds normal. No respiratory distress. She has no wheezes. She has no rales.  Lymphadenopathy:    She has no cervical  adenopathy.  Skin: Rash noted. She is not diaphoretic.     Vitals reviewed.       Assessment & Plan:     1. Bug bites She may continue her antihistamines for seasonal allergy relief as well as hydrocortisone cream for topical itch relief. We'll give a 6 day prednisone taper as below for inflammation and itch relief. She is to call the office if the rash spreads or if the itch is still uncontrolled. - predniSONE (STERAPRED UNI-PAK 21 TAB) 10 MG (21) TBPK tablet; Take 1 tablet (10 mg total) by mouth daily. Take as directed on package directions.  Dispense: 21 tablet; Refill: 0       Mar Daring, PA-C  [facility]

## 2016-02-02 NOTE — Patient Instructions (Signed)
Insect Bite Mosquitoes, flies, fleas, bedbugs, and many other insects can bite. Insect bites are different from insect stings. A sting is when poison (venom) is injected into the skin. Insect bites can cause pain or itching for a few days, but they are usually not serious. Some insects can spread diseases to people through a bite. SYMPTOMS  Symptoms of an insect bite include:  Itching or pain in the bite area.  Redness and swelling in the bite area.  An open wound (skin ulcer). In many cases, symptoms last for 2-4 days.  DIAGNOSIS  This condition is usually diagnosed based on symptoms and a physical exam. TREATMENT  Treatment is usually not needed for an insect bite. Symptoms often go away on their own. Your health care provider may recommend creams or lotions to help reduce itching. Antibiotic medicines may be prescribed if the bite becomes infected. A tetanus shot may be given in some cases. If you develop an allergic reaction to an insect bite, your health care provider will prescribe medicines to treat the reaction (antihistamines). This is rare. HOME CARE INSTRUCTIONS  Do not scratch the bite area.  Keep the bite area clean and dry. Wash the bite area daily with soap and water as told by your health care provider.  If directed, applyice to the bite area.  Put ice in a plastic bag.  Place a towel between your skin and the bag.  Leave the ice on for 20 minutes, 2-3 times per day.  To help reduce itching and swelling, try applying a baking soda paste, cortisone cream, or calamine lotion to the bite area as told by your health care provider.  Apply or take over-the-counter and prescription medicines only as told by your health care provider.  If you were prescribed an antibiotic medicine, use it as told by your health care provider. Do not stop using the antibiotic even if your condition improves.  Keep all follow-up visits as told by your health care provider. This is  important. PREVENTION   Use insect repellent. The best insect repellents contain:  DEET, picaridin, oil of lemon eucalyptus (OLE), or IR3535.  Higher amounts of an active ingredient.  When you are outdoors, wear clothing that covers your arms and legs.  Avoid opening windows that do not have window screens. SEEK MEDICAL CARE IF:  You have increased redness, swelling, or pain in the bite area.  You have a fever. SEEK IMMEDIATE MEDICAL CARE IF:   You have joint pain.   You have fluid, blood, or pus coming from the bite area.  You have a headache or neck pain.  You have unusual weakness.  You have a rash.  You have chest pain or shortness of breath.  You have abdominal pain, nausea, or vomiting.  You feel unusually tired or sleepy.   This information is not intended to replace advice given to you by your health care provider. Make sure you discuss any questions you have with your health care provider.   Document Released: 12/06/2004 Document Revised: 07/20/2015 Document Reviewed: 03/16/2015 Elsevier Interactive Patient Education 2016 [employer] Inc[employer]  

## 2016-03-03 ENCOUNTER — Observation Stay: Payer: 59 | Admitting: Anesthesiology

## 2016-03-03 ENCOUNTER — Emergency Department: Payer: 59

## 2016-03-03 ENCOUNTER — Encounter
Admission: EM | Disposition: A | Discharge status: Home / Self Care | Payer: Self-pay | Source: Home / Self Care | Attending: Emergency Medicine

## 2016-03-03 ENCOUNTER — Observation Stay
Admission: EM | Admit: 2016-03-03 | Discharge: 2016-03-03 | Disposition: A | Discharge status: Home / Self Care | Payer: 59 | Attending: Obstetrics and Gynecology | Admitting: Obstetrics and Gynecology

## 2016-03-03 DIAGNOSIS — K219 Gastro-esophageal reflux disease without esophagitis: Secondary | ICD-10-CM | POA: Diagnosis not present

## 2016-03-03 DIAGNOSIS — N83511 Torsion of right ovary and ovarian pedicle: Secondary | ICD-10-CM | POA: Diagnosis not present

## 2016-03-03 DIAGNOSIS — L600 Ingrowing nail: Secondary | ICD-10-CM | POA: Diagnosis not present

## 2016-03-03 DIAGNOSIS — D709 Neutropenia, unspecified: Secondary | ICD-10-CM | POA: Diagnosis not present

## 2016-03-03 DIAGNOSIS — Z8349 Family history of other endocrine, nutritional and metabolic diseases: Secondary | ICD-10-CM | POA: Diagnosis not present

## 2016-03-03 DIAGNOSIS — J45909 Unspecified asthma, uncomplicated: Secondary | ICD-10-CM | POA: Diagnosis not present

## 2016-03-03 DIAGNOSIS — N838 Other noninflammatory disorders of ovary, fallopian tube and broad ligament: Secondary | ICD-10-CM

## 2016-03-03 DIAGNOSIS — Z7951 Long term (current) use of inhaled steroids: Secondary | ICD-10-CM | POA: Insufficient documentation

## 2016-03-03 DIAGNOSIS — Z8249 Family history of ischemic heart disease and other diseases of the circulatory system: Secondary | ICD-10-CM | POA: Insufficient documentation

## 2016-03-03 DIAGNOSIS — Z8049 Family history of malignant neoplasm of other genital organs: Secondary | ICD-10-CM | POA: Diagnosis not present

## 2016-03-03 DIAGNOSIS — N839 Noninflammatory disorder of ovary, fallopian tube and broad ligament, unspecified: Secondary | ICD-10-CM | POA: Diagnosis present

## 2016-03-03 DIAGNOSIS — I252 Old myocardial infarction: Secondary | ICD-10-CM | POA: Diagnosis not present

## 2016-03-03 DIAGNOSIS — Z886 Allergy status to analgesic agent status: Secondary | ICD-10-CM | POA: Insufficient documentation

## 2016-03-03 DIAGNOSIS — K529 Noninfective gastroenteritis and colitis, unspecified: Secondary | ICD-10-CM | POA: Diagnosis not present

## 2016-03-03 DIAGNOSIS — R1031 Right lower quadrant pain: Secondary | ICD-10-CM | POA: Insufficient documentation

## 2016-03-03 DIAGNOSIS — M797 Fibromyalgia: Secondary | ICD-10-CM | POA: Diagnosis not present

## 2016-03-03 DIAGNOSIS — N83209 Unspecified ovarian cyst, unspecified side: Secondary | ICD-10-CM | POA: Diagnosis not present

## 2016-03-03 DIAGNOSIS — E039 Hypothyroidism, unspecified: Secondary | ICD-10-CM | POA: Insufficient documentation

## 2016-03-03 DIAGNOSIS — R1900 Intra-abdominal and pelvic swelling, mass and lump, unspecified site: Secondary | ICD-10-CM | POA: Insufficient documentation

## 2016-03-03 DIAGNOSIS — M542 Cervicalgia: Secondary | ICD-10-CM | POA: Insufficient documentation

## 2016-03-03 DIAGNOSIS — J309 Allergic rhinitis, unspecified: Secondary | ICD-10-CM | POA: Insufficient documentation

## 2016-03-03 DIAGNOSIS — M549 Dorsalgia, unspecified: Secondary | ICD-10-CM | POA: Diagnosis not present

## 2016-03-03 DIAGNOSIS — Z881 Allergy status to other antibiotic agents status: Secondary | ICD-10-CM | POA: Insufficient documentation

## 2016-03-03 DIAGNOSIS — Z79899 Other long term (current) drug therapy: Secondary | ICD-10-CM | POA: Insufficient documentation

## 2016-03-03 DIAGNOSIS — K589 Irritable bowel syndrome without diarrhea: Secondary | ICD-10-CM | POA: Insufficient documentation

## 2016-03-03 DIAGNOSIS — R011 Cardiac murmur, unspecified: Secondary | ICD-10-CM | POA: Diagnosis not present

## 2016-03-03 DIAGNOSIS — Z90711 Acquired absence of uterus with remaining cervical stump: Secondary | ICD-10-CM | POA: Diagnosis not present

## 2016-03-03 DIAGNOSIS — Z8261 Family history of arthritis: Secondary | ICD-10-CM | POA: Diagnosis not present

## 2016-03-03 DIAGNOSIS — G43909 Migraine, unspecified, not intractable, without status migrainosus: Secondary | ICD-10-CM | POA: Insufficient documentation

## 2016-03-03 HISTORY — PX: LAPAROSCOPY: SHX197

## 2016-03-03 LAB — COMPREHENSIVE METABOLIC PANEL WITH GFR
ALT: 17 U/L (ref 14–54)
AST: 23 U/L (ref 15–41)
Albumin: 4.5 g/dL (ref 3.5–5.0)
Alkaline Phosphatase: 42 U/L (ref 38–126)
Anion gap: 9 (ref 5–15)
BUN: 12 mg/dL (ref 6–20)
CO2: 23 mmol/L (ref 22–32)
Calcium: 9.3 mg/dL (ref 8.9–10.3)
Chloride: 107 mmol/L (ref 101–111)
Creatinine, Ser: 0.71 mg/dL (ref 0.44–1.00)
GFR calc Af Amer: >60 mL/min
GFR calc non Af Amer: >60 mL/min
Glucose, Bld: 126 mg/dL — ABNORMAL HIGH (ref 65–99)
Potassium: 3.3 mmol/L — ABNORMAL LOW (ref 3.5–5.1)
Sodium: 139 mmol/L (ref 135–145)
Total Bilirubin: 0.7 mg/dL (ref 0.3–1.2)
Total Protein: 7 g/dL (ref 6.5–8.1)

## 2016-03-03 LAB — URINALYSIS COMPLETE WITH MICROSCOPIC (ARMC ONLY)
BACTERIA UA: NONE SEEN
Bilirubin Urine: NEGATIVE
GLUCOSE, UA: NEGATIVE mg/dL
HGB URINE DIPSTICK: NEGATIVE
Ketones, ur: NEGATIVE mg/dL
LEUKOCYTES UA: NEGATIVE
Nitrite: NEGATIVE
Protein, ur: 30 mg/dL — AB
RBC / HPF: NONE SEEN RBC/hpf (ref 0–5)
Specific Gravity, Urine: 1.014 (ref 1.005–1.030)
pH: 9 — ABNORMAL HIGH (ref 5.0–8.0)

## 2016-03-03 LAB — TYPE AND SCREEN
ABO/RH(D): O POS
Antibody Screen: NEGATIVE

## 2016-03-03 LAB — CBC
HEMATOCRIT: 39.2 % (ref 35.0–47.0)
Hemoglobin: 13.8 g/dL (ref 12.0–16.0)
MCH: 29.2 pg (ref 26.0–34.0)
MCHC: 35.1 g/dL (ref 32.0–36.0)
MCV: 83.3 fL (ref 80.0–100.0)
Platelets: 192 10*3/uL (ref 150–440)
RBC: 4.7 MIL/uL (ref 3.80–5.20)
RDW: 13.4 % (ref 11.5–14.5)
WBC: 5.4 10*3/uL (ref 3.6–11.0)

## 2016-03-03 LAB — POCT PREGNANCY, URINE: Preg Test, Ur: NEGATIVE

## 2016-03-03 LAB — LIPASE, BLOOD: LIPASE: 25 U/L (ref 11–51)

## 2016-03-03 LAB — ABO/RH: ABO/RH(D): O POS

## 2016-03-03 SURGERY — LAPAROSCOPY, DIAGNOSTIC
Anesthesia: General | Laterality: Right | Wound class: Contaminated

## 2016-03-03 MED ORDER — SODIUM CHLORIDE 0.9 % IV BOLUS (SEPSIS)
1000.0000 mL | Freq: Once | INTRAVENOUS | Status: AC
  Administered 2016-03-03: 1000 mL via INTRAVENOUS

## 2016-03-03 MED ORDER — DEXAMETHASONE SODIUM PHOSPHATE 10 MG/ML IJ SOLN
INTRAMUSCULAR | Status: DC | PRN
  Administered 2016-03-03: 10 mg via INTRAVENOUS

## 2016-03-03 MED ORDER — FENTANYL CITRATE (PF) 100 MCG/2ML IJ SOLN: 25.0000 ug | INTRAMUSCULAR | Status: DC | PRN

## 2016-03-03 MED ORDER — ONDANSETRON HCL 4 MG/2ML IJ SOLN
INTRAMUSCULAR | Status: AC
  Administered 2016-03-03: 4 mg via INTRAVENOUS
  Filled 2016-03-03: qty 2

## 2016-03-03 MED ORDER — IBUPROFEN 600 MG PO TABS
600.0000 mg | ORAL_TABLET | ORAL | Status: AC
  Administered 2016-03-03: 600 mg via ORAL
  Filled 2016-03-03: qty 1

## 2016-03-03 MED ORDER — IOPAMIDOL (ISOVUE-300) INJECTION 61%
80.0000 mL | Freq: Once | INTRAVENOUS | Status: AC | PRN
  Administered 2016-03-03: 80 mL via INTRAVENOUS

## 2016-03-03 MED ORDER — FENTANYL CITRATE (PF) 100 MCG/2ML IJ SOLN
INTRAMUSCULAR | Status: DC | PRN
  Administered 2016-03-03: 50 ug via INTRAVENOUS
  Administered 2016-03-03: 100 ug via INTRAVENOUS

## 2016-03-03 MED ORDER — OXYCODONE-ACETAMINOPHEN 5-325 MG PO TABS: 1.0000 | ORAL_TABLET | Freq: Four times a day (QID) | ORAL | Status: DC | PRN

## 2016-03-03 MED ORDER — ONDANSETRON HCL 4 MG/2ML IJ SOLN
INTRAMUSCULAR | Status: DC | PRN
  Administered 2016-03-03: 4 mg via INTRAVENOUS

## 2016-03-03 MED ORDER — ROCURONIUM BROMIDE 100 MG/10ML IV SOLN
INTRAVENOUS | Status: DC | PRN
  Administered 2016-03-03: 10 mg via INTRAVENOUS
  Administered 2016-03-03: 5 mg via INTRAVENOUS
  Administered 2016-03-03: 15 mg via INTRAVENOUS

## 2016-03-03 MED ORDER — ONDANSETRON HCL 4 MG/2ML IJ SOLN: 4.0000 mg | Freq: Once | INTRAMUSCULAR | Status: DC | PRN

## 2016-03-03 MED ORDER — HYDROMORPHONE HCL 1 MG/ML IJ SOLN: 1.0000 mg | INTRAMUSCULAR | Status: DC | PRN

## 2016-03-03 MED ORDER — IBUPROFEN 200 MG PO TABS: 600.0000 mg | ORAL_TABLET | Freq: Four times a day (QID) | ORAL | Status: DC | PRN

## 2016-03-03 MED ORDER — OXYCODONE HCL 5 MG/5ML PO SOLN: 5.0000 mg | Freq: Once | ORAL | Status: DC | PRN

## 2016-03-03 MED ORDER — ONDANSETRON HCL 4 MG/2ML IJ SOLN
4.0000 mg | Freq: Once | INTRAMUSCULAR | Status: AC | PRN
  Administered 2016-03-03: 4 mg via INTRAVENOUS

## 2016-03-03 MED ORDER — PROPOFOL 10 MG/ML IV BOLUS
INTRAVENOUS | Status: DC | PRN
  Administered 2016-03-03: 130 mg via INTRAVENOUS

## 2016-03-03 MED ORDER — BUPIVACAINE HCL (PF) 0.5 % IJ SOLN
INTRAMUSCULAR | Status: AC
  Filled 2016-03-03: qty 30

## 2016-03-03 MED ORDER — FENTANYL CITRATE (PF) 100 MCG/2ML IJ SOLN
50.0000 ug | INTRAMUSCULAR | Status: AC | PRN
  Administered 2016-03-03 (×2): 50 ug via INTRAVENOUS
  Filled 2016-03-03: qty 2

## 2016-03-03 MED ORDER — METOCLOPRAMIDE HCL 5 MG/ML IJ SOLN
10.0000 mg | Freq: Once | INTRAMUSCULAR | Status: AC
  Administered 2016-03-03: 10 mg via INTRAVENOUS
  Filled 2016-03-03: qty 2

## 2016-03-03 MED ORDER — MORPHINE SULFATE (PF) 4 MG/ML IV SOLN
4.0000 mg | Freq: Once | INTRAVENOUS | Status: AC
  Administered 2016-03-03: 4 mg via INTRAVENOUS
  Filled 2016-03-03: qty 1

## 2016-03-03 MED ORDER — SODIUM CHLORIDE 0.9 % IV SOLN
INTRAVENOUS | Status: DC
  Administered 2016-03-03: 15:00:00 via INTRAVENOUS

## 2016-03-03 MED ORDER — DOCUSATE SODIUM 100 MG PO CAPS: 100.0000 mg | ORAL_CAPSULE | Freq: Two times a day (BID) | ORAL | Status: DC

## 2016-03-03 MED ORDER — LIDOCAINE HCL (CARDIAC) 20 MG/ML IV SOLN
INTRAVENOUS | Status: DC | PRN
  Administered 2016-03-03: 100 mg via INTRAVENOUS

## 2016-03-03 MED ORDER — SUCCINYLCHOLINE CHLORIDE 20 MG/ML IJ SOLN
INTRAMUSCULAR | Status: DC | PRN
  Administered 2016-03-03: 100 mg via INTRAVENOUS

## 2016-03-03 MED ORDER — DIATRIZOATE MEGLUMINE & SODIUM 66-10 % PO SOLN
15.0000 mL | ORAL | Status: AC
  Administered 2016-03-03: 15 mL via ORAL
  Filled 2016-03-03 (×2): qty 30

## 2016-03-03 MED ORDER — BUPIVACAINE HCL 0.5 % IJ SOLN
INTRAMUSCULAR | Status: DC | PRN
  Administered 2016-03-03: 10 mL
  Administered 2016-03-03: 8 mL

## 2016-03-03 MED ORDER — IPRATROPIUM-ALBUTEROL 0.5-2.5 (3) MG/3ML IN SOLN
3.0000 mL | Freq: Once | RESPIRATORY_TRACT | Status: AC
  Administered 2016-03-03: 3 mL via RESPIRATORY_TRACT

## 2016-03-03 MED ORDER — HYDROMORPHONE HCL 1 MG/ML IJ SOLN
1.0000 mg | Freq: Once | INTRAMUSCULAR | Status: AC
  Administered 2016-03-03: 1 mg via INTRAVENOUS
  Filled 2016-03-03: qty 1

## 2016-03-03 MED ORDER — NEOSTIGMINE METHYLSULFATE 10 MG/10ML IV SOLN
INTRAVENOUS | Status: DC | PRN
Start: 2016-03-03 — End: 2016-03-03
  Administered 2016-03-03: 4 mg via INTRAVENOUS

## 2016-03-03 MED ORDER — LACTATED RINGERS IV SOLN
INTRAVENOUS | Status: DC | PRN
  Administered 2016-03-03 (×2): via INTRAVENOUS

## 2016-03-03 MED ORDER — GLYCOPYRROLATE 0.2 MG/ML IJ SOLN
INTRAMUSCULAR | Status: DC | PRN
  Administered 2016-03-03: 0.4 mg via INTRAVENOUS

## 2016-03-03 MED ORDER — MIDAZOLAM HCL 2 MG/2ML IJ SOLN
INTRAMUSCULAR | Status: DC | PRN
  Administered 2016-03-03: 2 mg via INTRAVENOUS

## 2016-03-03 MED ORDER — OXYCODONE HCL 5 MG PO TABS: 5.0000 mg | ORAL_TABLET | Freq: Once | ORAL | Status: DC | PRN

## 2016-03-03 MED ORDER — FENTANYL CITRATE (PF) 100 MCG/2ML IJ SOLN
INTRAMUSCULAR | Status: AC
  Administered 2016-03-03: 50 ug via INTRAVENOUS
  Filled 2016-03-03: qty 2

## 2016-03-03 MED ORDER — ONDANSETRON HCL 4 MG/2ML IJ SOLN
4.0000 mg | INTRAMUSCULAR | Status: AC
  Administered 2016-03-03: 4 mg via INTRAVENOUS
  Filled 2016-03-03: qty 2

## 2016-03-03 MED ORDER — MORPHINE SULFATE (PF) 4 MG/ML IV SOLN
4.0000 mg | INTRAVENOUS | Status: AC | PRN
  Administered 2016-03-03: 4 mg via INTRAVENOUS
  Filled 2016-03-03: qty 1

## 2016-03-03 MED ORDER — ONDANSETRON HCL 4 MG/2ML IJ SOLN: 4.0000 mg | Freq: Once | INTRAMUSCULAR | Status: DC

## 2016-03-03 SURGICAL SUPPLY — 55 items
APPLICATOR COTTON TIP 6IN STRL (MISCELLANEOUS) ×2 IMPLANT
BAG URO DRAIN 2000ML W/SPOUT (MISCELLANEOUS) ×2 IMPLANT
BLADE SURG SZ11 CARB STEEL (BLADE) ×2 IMPLANT
CANISTER SUCT 1200ML W/VALVE (MISCELLANEOUS) ×2 IMPLANT
CATH FOLEY 2WAY  5CC 16FR (CATHETERS) ×1
CATH URTH 16FR FL 2W BLN LF (CATHETERS) ×1 IMPLANT
CHLORAPREP W/TINT 26ML (MISCELLANEOUS) ×2 IMPLANT
CNTNR SPEC 2.5X3XGRAD LEK (MISCELLANEOUS) ×1
CONT SPEC 4OZ STER OR WHT (MISCELLANEOUS) ×1
CONTAINER SPEC 2.5X3XGRAD LEK (MISCELLANEOUS) ×1 IMPLANT
CORD MONOPOLAR M/FML 12FT (MISCELLANEOUS) ×2 IMPLANT
DRAPE LEGGINS SURG 28X43 STRL (DRAPES) ×2 IMPLANT
DRESSING SURGICEL FIBRLLR 1X2 (HEMOSTASIS) IMPLANT
DRESSING TELFA 4X3 1S ST N-ADH (GAUZE/BANDAGES/DRESSINGS) ×2 IMPLANT
DRSG SURGICEL FIBRILLAR 1X2 (HEMOSTASIS)
DRSG TEGADERM 2-3/8X2-3/4 SM (GAUZE/BANDAGES/DRESSINGS) ×6 IMPLANT
ELECT REM PT RETURN 9FT ADLT (ELECTROSURGICAL) ×2
ELECTRODE REM PT RTRN 9FT ADLT (ELECTROSURGICAL) ×1 IMPLANT
GAUZE SPONGE NON-WVN 2X2 STRL (MISCELLANEOUS) IMPLANT
GLOVE BIO SURGEON STRL SZ7 (GLOVE) ×4 IMPLANT
GLOVE BIOGEL PI IND STRL 7.5 (GLOVE) ×1 IMPLANT
GLOVE BIOGEL PI INDICATOR 7.5 (GLOVE) ×1
GOWN STRL REUS W/ TWL LRG LVL3 (GOWN DISPOSABLE) ×1 IMPLANT
GOWN STRL REUS W/ TWL XL LVL3 (GOWN DISPOSABLE) ×1 IMPLANT
GOWN STRL REUS W/TWL LRG LVL3 (GOWN DISPOSABLE) ×1
GOWN STRL REUS W/TWL XL LVL3 (GOWN DISPOSABLE) ×1
IRRIGATION STRYKERFLOW (MISCELLANEOUS) IMPLANT
IRRIGATOR STRYKERFLOW (MISCELLANEOUS)
IV LACTATED RINGERS 1000ML (IV SOLUTION) IMPLANT
KIT PINK PAD W/HEAD ARE REST (MISCELLANEOUS) ×2
KIT PINK PAD W/HEAD ARM REST (MISCELLANEOUS) ×1 IMPLANT
KIT RM TURNOVER CYSTO AR (KITS) ×2 IMPLANT
LABEL OR SOLS (LABEL) ×2 IMPLANT
LIGASURE BLUNT 5MM 37CM (INSTRUMENTS) ×2 IMPLANT
LIGASURE MARYLAND LAP STAND (ELECTROSURGICAL) IMPLANT
LIQUID BAND (GAUZE/BANDAGES/DRESSINGS) ×2 IMPLANT
NS IRRIG 500ML POUR BTL (IV SOLUTION) ×2 IMPLANT
PACK GYN LAPAROSCOPIC (MISCELLANEOUS) ×2 IMPLANT
PAD OB MATERNITY 4.3X12.25 (PERSONAL CARE ITEMS) ×2 IMPLANT
PAD PREP 24X41 OB/GYN DISP (PERSONAL CARE ITEMS) ×2 IMPLANT
POUCH ENDO CATCH 10MM SPEC (MISCELLANEOUS) IMPLANT
SCISSORS METZENBAUM CVD 33 (INSTRUMENTS) ×2 IMPLANT
SLEEVE ENDOPATH XCEL 5M (ENDOMECHANICALS) ×2 IMPLANT
SOL PREP PVP 2OZ (MISCELLANEOUS) ×2
SOLUTION PREP PVP 2OZ (MISCELLANEOUS) ×1 IMPLANT
SPONGE VERSALON 2X2 STRL (MISCELLANEOUS)
SUT MNCRL 4-0 (SUTURE) ×1
SUT MNCRL 4-0 27XMFL (SUTURE) ×1
SUT VICRYL 0 AB UR-6 (SUTURE) ×2 IMPLANT
SUTURE MNCRL 4-0 27XMF (SUTURE) ×1 IMPLANT
SYRINGE 10CC LL (SYRINGE) ×2 IMPLANT
TROCAR BLUNT TIP 12MM OMST12BT (TROCAR) ×2 IMPLANT
TROCAR XCEL NON-BLD 5MMX100MML (ENDOMECHANICALS) ×2 IMPLANT
TUBING INSUFFLATOR HI FLOW (MISCELLANEOUS) IMPLANT
TUBING SCD EXTENSION 9995 GYN (TUBING) ×2 IMPLANT

## 2016-03-03 NOTE — Consult Note (Addendum)
Obstetrics & Gynecology Consultation Note  Date of Consultation: 03/03/2016   Requesting Provider: [facility] ER;  Dr. Jacqualine Code  Primary OBGYN: [facility], Dr. Kenton Kingfisher Primary Care Provider: Margarita Rana  Reason for Consultation: RLQ abdominal and back pain  History of Present Illness: Ms. Mcfail is a 45 y.o. G2P1 (No LMP recorded. Patient has had a hysterectomy.), with the above CC. PMHx is significant for h/o TLH/BS in 2015 for AUB by Dr Kenton Kingfisher, h/o c-section x 1, hypothyroidism, fibromyalgia, MI asthma (rare albuterol use), IBS.  Patient with RLQ and right back pain starting at 0500 today. No prior instances of this and it feels constant but can come on in waves at times. +Nausea and no vomiting and last PO was before MN. No chest pain, SOB, fevers, diarrhea, constipation, hematuria, dysuria, malodorous urine, vaginal discharge, itching or bleeding.    She has received multiple doses of pain meds in the ER and is still uncomfortable. CT with contrast showed normal appendix but 8cm right septated complex mass with 4cm soft tissue density above it. Minimal FF and no significant RP LAD. U/s confirmatory. No e/o torsion on u/s with small, normal appearing LO.   ROS: A 12-point review of systems was performed and negative, except as stated in the above HPI.  OBGYN History: As per HPI. OB History  Gravida Para Term Preterm AB SAB TAB Ectopic Multiple Living  2 1            # Outcome Date GA Lbr Len/2nd Weight Sex Delivery Anes PTL Lv  2 Gravida           1 Para               History of abnormal pap smears: No.    Past Medical History: Past Medical History  Diagnosis Date  . Asthma   . Thyroid disease   . Allergy   . IBS (irritable bowel syndrome)   . Heart murmur   . Fibromyalgia     Past Surgical History: Past Surgical History  Procedure Laterality Date  . Laparoscopic total hysterectomy      with BS. Dr. Norton Blizzard [facility]  . Knee arthroscopy    . Cesarean section       Family History:  Family History  Problem Relation Age of Onset  . Heart attack Father   . Arthritis Mother   . Hyperlipidemia Mother   . Thyroid disease Brother   Someone on her dad's side had uterine cancer but no other female cancers  Social History:  Social History   Social History  . Marital Status: Married    Spouse Name: N/A  . Number of Children: N/A  . Years of Education: N/A   Occupational History  . Not on file.   Social History Main Topics  . Smoking status: Never Smoker   . Smokeless tobacco: Never Used  . Alcohol Use: Yes     Comment: occasional  . Drug Use: No  . Sexual Activity: Not on file   Other Topics Concern  . Not on file   Social History Narrative    Health Maintenance:  Mammogram: UTD per patient  Allergy: Allergies  Allergen Reactions  . Aspirin Other (See Comments)    Causes asthma to act up   . Erythromycin Other (See Comments)    Stomach cramping     Current Outpatient Medications: PPI, synthroid 50, zyrtec, singulair, PRN albuterol   Physical Exam:  Patient Vitals for the past 24 hrs:  BP Temp Pulse Resp SpO2 Height Weight  03/03/16 1300 111/71 mmHg - 68 10 95 % - -  03/03/16 1230 110/72 mmHg - (!) 58 12 98 % - -  03/03/16 1200 106/74 mmHg - 63 (!) 21 100 % - -  03/03/16 1155 124/78 mmHg - 70 13 98 % - -  03/03/16 1113 120/74 mmHg - 69 18 100 % - -  03/03/16 0900 131/86 mmHg - 65 15 100 % - -  03/03/16 0820 126/73 mmHg - 66 16 96 % - -  03/03/16 0646 (!) 144/85 mmHg 98.2 F (36.8 C) 75 20 100 % 5\' 4"  (1.626 m) 134 lb (60.782 kg)    Body mass index is 22.99 kg/(m^2). General appearance: Well nourished, well developed female in no acute distress.  Cardiovascular: : S1, S2 normal, no murmur, rub or gallop, regular rate and rhythm Respiratory:  Clear to auscultation bilateral. Normal respiratory effort Abdomen: +BS, soft, nd, mildly ttp in RLQ but also across lower belly Back: no CVAT Neuro/Psych:  Normal mood and  affect.  Skin:  Warm and dry.  Lymphatic:  No inguinal lymphadenopathy.   Pelvic exam: deferred Laboratory: POC UPT negative  Recent Labs Lab 03/03/16 0651  WBC 5.4  HGB 13.8  HCT 39.2  PLT 192    Recent Labs Lab 03/03/16 0651  NA 139  K 3.3*  CL 107  CO2 23  BUN 12  CREATININE 0.71  CALCIUM 9.3  PROT 7.0  BILITOT 0.7  ALKPHOS 42  ALT 17  AST 23  GLUCOSE 126*   Imaging:  As above  Assessment: Ms. Serrette is a 45 y.o. G2P1 (No LMP recorded. Patient has had a hysterectomy.) who presented to the ED with RLQ pain. Pt currently stable  Plan: D/w pt and recommend surgical intervention given complex nature of RLQ mass. I told her that given amount of pain meds she's received and she's still uncomfortable that I don't believe this can be done at a later time with Onc back up and recommending proceeding now. I d/w her risk of open procedure and risks associated with surgery and she is amenable to this. I told her that I believe she probably has a torsion but always a chance of malignancy and need for surgery at a later date. I told her that if the LO looks fine that I'll leave but may take it and other tissue depending one what I see during the surgery  ER asked to get CA125 and type and screen. Pt told if all goes well that she can go home after the surgery.  Will proceed with diagnostic laparoscopy,pelvic washings, left oophorectomy; possible exploratory laparotomy, possible right oophorectomy, all indicated procedures  I will call the OR for availability  Total time taking care of the patient was 25 minutes, with greater than 50% of the time spent in face to face interaction with the patient.  Durene Romans MD [facility] Pager [phone]

## 2016-03-03 NOTE — ED Notes (Signed)
Patient transported to surgery. 

## 2016-03-03 NOTE — ED Notes (Signed)
RN from surgery called to get report on patient. States they will send someone to come and get her for transport.

## 2016-03-03 NOTE — Anesthesia Postprocedure Evaluation (Signed)
Anesthesia Post Note  Patient: Brenda Price  Procedure(s) Performed: Procedure(s) (LRB):  diagnostic laparoscopy and right oophrectomy (Right)  Patient location during evaluation: PACU Anesthesia Type: General Level of consciousness: awake and alert Pain management: pain level controlled Vital Signs Assessment: post-procedure vital signs reviewed and stable Respiratory status: spontaneous breathing, nonlabored ventilation, respiratory function stable and patient connected to nasal cannula oxygen Cardiovascular status: blood pressure returned to baseline and stable Postop Assessment: no signs of nausea or vomiting Anesthetic complications: no    Last Vitals:  Filed Vitals:   03/03/16 1733 03/03/16 1742  BP: 102/61 104/66  Pulse: 72 67  Temp:    Resp: 16 16    Last Pain:  Filed Vitals:   03/03/16 1743  PainSc: 0-No pain                 Precious Haws Romayne Ticas

## 2016-03-03 NOTE — Transfer of Care (Signed)
Immediate Anesthesia Transfer of Care Note  Patient: Brenda Price  Procedure(s) Performed: Procedure(s):  diagnostic laparoscopy and right oophrectomy (Right)  Patient Location: PACU  Anesthesia Type:General  Level of Consciousness: awake, alert  and oriented  Airway & Oxygen Therapy: Patient Spontanous Breathing and Patient connected to nasal cannula oxygen  Post-op Assessment: Report given to RN and Post -op Vital signs reviewed and stable  Post vital signs: Reviewed and stable  Last Vitals:  Filed Vitals:   03/03/16 1330 03/03/16 1400  BP: 116/80 114/82  Pulse: 56 67  Temp:    Resp: 13 17    Complications: No apparent anesthesia complications

## 2016-03-03 NOTE — ED Notes (Signed)
Pt reports pain started in her right lower abd this morning and in her right lower back. Pt reports +nausea but denies vomiting. Pt denies urinary sx. Pt has hx of partial hysterectomy but still has her ovaries.

## 2016-03-03 NOTE — H&P (Signed)
See ER consult note  Durene Romans MD [facility]  Pager: [phone]

## 2016-03-03 NOTE — Discharge Summary (Signed)
Gynecology Discharge Summary Date of Admission: 03/03/2016 Date of Discharge: 03/03/2016  The patient was admitted, as scheduled, and underwent a diagnostic laparoscopy, right oophorectomy for torsion; please refer to operative note for full details.  She was meeting all post op goals and discharged to home on POD#0 from the PACU    Medication List    TAKE these medications        baclofen 10 MG tablet  Commonly known as:  LIORESAL  Take 10 mg by mouth 3 (three) times daily as needed for muscle spasms.     cetirizine 10 MG tablet  Commonly known as:  ZYRTEC  Take 10 mg by mouth daily.     docusate sodium 100 MG capsule  Commonly known as:  COLACE  Take 1 capsule (100 mg total) by mouth 2 (two) times daily.     gabapentin 300 MG capsule  Commonly known as:  NEURONTIN  Take 1 capsule (300 mg total) by mouth 2 (two) times daily.     ibuprofen 200 MG tablet  Commonly known as:  MOTRIN IB  Take 3 tablets (600 mg total) by mouth every 6 (six) hours as needed for headache, mild pain or cramping.     levothyroxine 50 MCG tablet  Commonly known as:  SYNTHROID, LEVOTHROID  Take 1 tablet by mouth  daily     montelukast 10 MG tablet  Commonly known as:  SINGULAIR  Take 1 tablet by mouth  daily     omeprazole 20 MG capsule  Commonly known as:  PRILOSEC  Take 20 mg by mouth daily.     oxyCODONE-acetaminophen 5-325 MG tablet  Commonly known as:  ROXICET  Take 1 tablet by mouth every 6 (six) hours as needed for severe pain.     predniSONE 10 MG (21) Tbpk tablet  Commonly known as:  STERAPRED UNI-PAK 21 TAB  Take 1 tablet (10 mg total) by mouth daily. Take as directed on package directions.     PROAIR HFA 108 (90 Base) MCG/ACT inhaler  Generic drug:  albuterol  Inhale 2 puffs into the lungs every 6 (six) hours as needed for wheezing or shortness of breath.     topiramate 25 MG tablet  Commonly known as:  TOPAMAX  Take 12.5 mg by mouth daily.        Patient to call for 2wk  post operative visit.  Durene Romans MD [facility]  Pager: [phone]

## 2016-03-03 NOTE — Discharge Instructions (Addendum)
[facility] Laparoscopic Surgery Discharge Instructions  Instructions Following Laparoscopic Surgery You have just undergone a major laparoscopic surgery.  The following list should answer your most common questions.  Although we will discuss your surgery and post-operative instructions with you prior to your discharge, this list will serve as a reminder if you fail to recall the details of what we discussed.  We will discuss your surgery once again in detail at your post-op visit in two to four weeks. If you havent already done so, please call to make your appointment as soon as possible.  How you will feel: Although you have just undergone a major surgery, your recovery will be significantly shorter since the surgery was performed through much smaller incisions than the traditional approach.  You should feel slightly better each day.  If you suddenly feel much worse than the prior day, please call the clinic.  Its important during the early part of your recovery that you maintain some activity.  Walking is encouraged.  You will quicken your recovery by continued activity.  Incision:  Your incisions will be closed with dissolvable stitches or surgical adhesive (glue).  There may be Band-aids and/or Steri-strips covering your incisions.  If there is no drainage from the incisions you may remove the Band-aids in one to two days.  You may notice some minor bruising at the incision sites.  This is common and will resolve within several days.  Please inform us if the redness at the edges of your incision appears to be spreading.  If the skin around your incision becomes warm to the touch, or if you notice a pus-like drainage, please call the office.   Stairs/Driving/Activities: You may climb stairs if necessary.  If youve had general anesthesia, do not drive a car the rest of the day today.  You may begin light housework when you feel up to it, but avoid heavy lifting (more than 15-20lbs) or pushing  until cleared for these activities by your physician.  Hygiene:  Do not soak your incisions.  Showers are acceptable but you may not take a bath or swim in a pool.  Cleanse your incisions daily with soap and water.  Medications:  Please resume taking any medications that you were taking prior to the surgery.  If we have prescribed any new medications for you, please take them as directed.  Constipation:  It is fairly common to experience some difficulty in moving your bowels following major surgery.  Being active will help to reduce this likelihood. A diet rich in fiber and plenty of liquids is desirable.  If you do become constipated, a mild laxative such as Miralax, Milk of Magnesia, or Metamucil, or a stool softener such as Colace, is recommended.  General Instructions: If you develop a fever of 100.5 degrees or higher, please call the office number(s) below for physician on call.    We will discuss your surgery once again in detail at your post-op visit in two to four weeks. If you havent already done so, please call to make your appointment as soon as possible.  [facility]  [address] [place], [place] [address] [place], [place] [address]  Phone: [phone] Phone: [phone]  Fax: [phone] Fax: [phone]       AMBULATORY SURGERY  DISCHARGE INSTRUCTIONS   1) The drugs that you were given will stay in your system until tomorrow so for the next 24 hours you should not:  A) Drive an automobile  B) Make any legal decisions C) Drink any alcoholic beverage   2) You may resume regular meals tomorrow.  Today it is better to start with liquids and gradually work up to solid foods.  You may eat anything you prefer, but it is better to start with liquids, then soup and crackers, and gradually work up to solid foods.   3) Please notify your doctor immediately if you have any unusual bleeding, trouble breathing, redness and pain at the surgery  site, drainage, fever, or pain not relieved by medication.  4) Your post-operative visit with Dr.                                     is: Date:                        Time:    Please call to schedule your post-operative visit.  5) Additional Instructions: 6)

## 2016-03-03 NOTE — ED Notes (Signed)
Patient transported to CT 

## 2016-03-03 NOTE — ED Notes (Signed)
Pt transported to ultrasound.

## 2016-03-03 NOTE — Anesthesia Preprocedure Evaluation (Signed)
Anesthesia Evaluation  Patient identified by MRN, date of birth, ID band Patient awake    Reviewed: Allergy & Precautions, H&P , NPO status , Patient's Chart, lab work & pertinent test results  History of Anesthesia Complications Negative for: history of anesthetic complications  Airway Mallampati: III  TM Distance: >3 FB Neck ROM: full    Dental  (+) Poor Dentition   Pulmonary asthma ,    Pulmonary exam normal breath sounds clear to auscultation       Cardiovascular Exercise Tolerance: Good (-) angina(-) Past MI and (-) DOE Normal cardiovascular exam+ Valvular Problems/Murmurs MVP  Rhythm:regular Rate:Normal     Neuro/Psych  Headaches,  Neuromuscular disease negative psych ROS   GI/Hepatic Neg liver ROS, GERD  Controlled,  Endo/Other  Hypothyroidism   Renal/GU negative Renal ROS  negative genitourinary   Musculoskeletal  (+) Fibromyalgia -  Abdominal   Peds  Hematology negative hematology ROS (+)   Anesthesia Other Findings Past Medical History:   Asthma                                                       Thyroid disease                                              Allergy                                                      IBS (irritable bowel syndrome)                               Heart murmur                                                 Fibromyalgia                                                Past Surgical History:   LAPAROSCOPIC TOTAL HYSTERECTOMY                                 Comment:with BS. Dr. Norton Blizzard OBGYN   KNEE ARTHROSCOPY                                              CESAREAN SECTION                                             BMI  Body Mass Index   22.98 kg/m 2      Reproductive/Obstetrics negative OB ROS                             Anesthesia Physical Anesthesia Plan  ASA: III  Anesthesia Plan: General ETT   Post-op Pain Management:     Induction:   Airway Management Planned:   Additional Equipment:   Intra-op Plan:   Post-operative Plan:   Informed Consent: I have reviewed the patients History and Physical, chart, labs and discussed the procedure including the risks, benefits and alternatives for the proposed anesthesia with the patient or authorized representative who has indicated his/her understanding and acceptance.   Dental Advisory Given  Plan Discussed with: Anesthesiologist, CRNA and Surgeon  Anesthesia Plan Comments:         Anesthesia Quick Evaluation

## 2016-03-03 NOTE — Anesthesia Procedure Notes (Signed)
Procedure Name: Intubation Date/Time: 03/03/2016 2:48 PM Performed by: Clinton Sawyer Pre-anesthesia Checklist: Patient identified, Emergency Drugs available, Suction available, Patient being monitored and Timeout performed Patient Re-evaluated:Patient Re-evaluated prior to inductionOxygen Delivery Method: Circle system utilized Preoxygenation: Pre-oxygenation with 100% oxygen Intubation Type: IV induction, Rapid sequence and Cricoid Pressure applied Laryngoscope Size: Mac and 4 Grade View: Grade I Tube type: Oral Tube size: 7.0 mm Number of attempts: 1 Airway Equipment and Method: Stylet and Oral airway Placement Confirmation: ETT inserted through vocal cords under direct vision,  positive ETCO2,  CO2 detector and breath sounds checked- equal and bilateral Secured at: 21 cm Tube secured with: Tape Dental Injury: Teeth and Oropharynx as per pre-operative assessment

## 2016-03-03 NOTE — Op Note (Addendum)
Operative Note   03/03/2016  PRE-OP DIAGNOSIS *Right adnexal mass. RLQ pain   POST-OP DIAGNOSIS *Same. Right torsed ovary   SURGEON: Surgeon(s) and Role:    * Aletha Halim, MD - Primary  ASSISTANT: None  PROCEDURE: diagnostic laparoscopy, collection of pelvic fluid, and right oophrectomy   ANESTHESIA: General and local  ESTIMATED BLOOD LOSS: 74mL during procedure. Approximately 41mL old blood in the pelvis and in the ovary.  DRAINS: indwelling foley (418mL UOP)   TOTAL IV FLUIDS: 1034mL crystalloid  VTE PROPHYLAXIS: SCDs to the bilateral lower extremities  ANTIBIOTICS: not indicated  SPECIMENS: pelvic fluid and right ovary to pathology  DISPOSITION: PACU - hemodynamically stable.  CONDITION: stable  COMPLICATIONS: None  FINDINGS: Right torsed, enlarged and necrotic appearing ovary with old blood in it. Old, dark blood in the posterior cul de sac (approximately 4mL). Normal appearing left ovary with small, subcentimeter likely forming corpus luteum. EGBUS and vaginal vault and cuff normal with no blood or discharge. Normal gallbladder, liver, stomach edge, and omentum, with normal appearing appendix. Right ureter traced into the lower pelvis before and after the surgery. Filmy adhesions in the area of the right abdominal sidewall, above the pelvic brim, a few cm from the start of the ascending colon; also, minimal filmy adhesions just inferior to that from the appendix to the right abdominal sidewall, just above the pelvic brim.   DESCRIPTION OF PROCEDURE: After informed consent was obtained, the patient was taken to the operating room where anesthesia was obtained without difficulty. The patient was positioned in the dorsal lithotomy position in [place] and her arms were carefully tucked at her sides and the usual precautions were taken.  She was prepped and draped in normal sterile fashion.  Time-out was performed and a Foley catheter was placed into the bladder. A  sponged on a stick was placed in the vagina. Gloves were then changed, and after injection of local anesthesia, the open technique was used to place an infraumbilical [id number] baloon trocar under direct visualization. The laparoscope was introduced and CO2 gas was infused for pneumoperitoneum to a pressure of 15 mm Hg and the area below inspected for injury. Old, dark blood was suctioned out of the lower pelvis and sent to pathology.  The patient was placed in Trendelenburg and the bowel was displaced up into the upper abdomen, and the left lateral and suprapubic 5-mm ports were placed under direct visualization of the laparoscope, after injection of local anesthesia.    The right adnexa was viewed, as described above, as well as the left ovary. The right adnexa was then untwisted. The peritoneum parallel to the right IP ligament was opened with the monopolar scissors and a window was made. The right IP was then serially cauterized and then transected with the Ligasure. It was then removed from the abdomen with the endocatch bag via the umbilical port, under direct visualization and via morcellation. The umbilical port was put back into the abdomen the operative pedicle was viewed under low pressure (27mmHg). The suprapubic and the LLQ ports were then removed under direct visualization and the gas released and the umbilical port removed. The fascia there was then tied with a 0-vicryl figure of eight stitch and skin closed with 4-0 monocryl and Liquidband; the other two sites were closed with Liquiband. The sponge stick was removed from the vagina, as well as the foley from the bladder.  Sponge, lap and needle counts were correct x2.  The patient was taken to recovery  room in excellent condition.  Durene Romans MD [facility]  Pager: [phone]

## 2016-03-03 NOTE — ED Provider Notes (Signed)
[facility] Emergency Department Provider Note  ____________________________________________  Time seen: Approximately 8:23 AM  I have reviewed the triage vital signs and the nursing notes.   HISTORY  Chief Complaint Abdominal Pain and Flank Pain    HPI Brenda Price is a 45 y.o. female describe herself as quite healthy, history of asthma, irritable bowel syndrome, and a previous partial hysterectomy.  She reports feeling normal yesterday. She woke up this morning till her dog about and since that time is noticed an achy discomfort which seemingly is become more severe sharp located in the right lower abdomen. Notably worse with standing and walking. She reports the pain was very severe until given medicine in triage and is improved. She was having nausea from the pain but this is also gone now too. She currently reports feeling much more comfortable.  She denies any urinary symptoms, she denies any blood in her urine. No previous history of any kidney stones.  No vaginal bleeding or discharge. Denies any vaginal discomfort.  Partial hysterectomy.  Past Medical History  Diagnosis Date  . Asthma   . Thyroid disease   . Allergy   . IBS (irritable bowel syndrome)   . Heart murmur   . Fibromyalgia     Patient Active Problem List   Diagnosis Date Noted  . Pelvic mass in female 03/03/2016  . 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 Family planning 09/29/2015  . Acid reflux 09/29/2015  . Cardiac murmur 09/29/2015  . Excess, menstruation 09/29/2015  . Hemorrhoid 09/29/2015  . 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 Embedded toenail 09/29/2015  . Irregular bleeding 09/29/2015  . Headache, migraine 09/29/2015  . 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 Cervical pain 09/29/2015  . Neutropenia ([place]) 09/29/2015  . Fungal infection of nail 09/29/2015  . Allergic rhinitis, seasonal 09/29/2015  . Asthma 08/29/2015    Past  Surgical History  Procedure Laterality Date  . Laparoscopic total hysterectomy      with [facility]. Dr. [facility]  . Knee arthroscopy    . Cesarean section      Current Outpatient Rx  Name  Route  Sig  Dispense  Refill  . baclofen (LIORESAL) 10 MG tablet   Oral   Take 10 mg by mouth 3 (three) times daily as needed for muscle spasms.          . cetirizine (ZYRTEC) 10 MG tablet   Oral   Take 10 mg by mouth daily.         Marland Kitchen levothyroxine (SYNTHROID, LEVOTHROID) 50 MCG tablet      Take 1 tablet by mouth  daily   90 tablet   3   . montelukast (SINGULAIR) 10 MG tablet      Take 1 tablet by mouth  daily   90 tablet   3   . omeprazole (PRILOSEC) 20 MG capsule   Oral   Take 20 mg by mouth daily.         Marland Kitchen PROAIR HFA 108 (90 BASE) MCG/ACT inhaler   Inhalation   Inhale 2 puffs into the lungs every 6 (six) hours as needed for wheezing or shortness of breath.          . topiramate (TOPAMAX) 25 MG tablet   Oral   Take 12.5 mg by mouth daily.          Marland Kitchen gabapentin (NEURONTIN) 300 MG capsule   Oral   Take 1 capsule (300 mg  total) by mouth 2 (two) times daily. Patient not taking: Reported on 02/02/2016   60 capsule   3   . predniSONE (STERAPRED UNI-PAK 21 TAB) 10 MG (21) TBPK tablet   Oral   Take 1 tablet (10 mg total) by mouth daily. Take as directed on package directions.   21 tablet   0     Allergies Aspirin and Erythromycin  Family History  Problem Relation Age of Onset  . Heart attack Father   . Arthritis Mother   . Hyperlipidemia Mother   . Thyroid disease Brother     Social History Social History  Substance Use Topics  . Smoking status: Never Smoker   . Smokeless tobacco: Never Used  . Alcohol Use: Yes     Comment: occasional    Review of Systems Constitutional: No fever/chills Eyes: No visual changes. ENT: No sore throat. Cardiovascular: Denies chest pain. Respiratory: Denies shortness of breath. Gastrointestinal: No pain in the  upper abdomen. No pain on the left side. All of her pain is located in the right lower abdomen. No vomiting.  No diarrhea.  No constipation. Genitourinary: Negative for dysuria. Musculoskeletal: Negative for back pain. Skin: Negative for rash. Neurological: Negative for headaches, focal weakness or numbness.  10-point ROS otherwise negative.  ____________________________________________   PHYSICAL EXAM:  VITAL SIGNS: ED Triage Vitals  Enc Vitals Group     BP 03/03/16 0646 144/85 mmHg     Pulse Rate 03/03/16 0646 75     Resp 03/03/16 0646 20     Temp 03/03/16 0646 98.2 F (36.8 C)     Temp src --      SpO2 03/03/16 0646 100 %     Weight 03/03/16 0646 134 lb (60.782 kg)     Height 03/03/16 0646 5\' 4"  (1.626 m)     Head Cir --      Peak Flow --      Pain Score 03/03/16 0658 10     Pain Loc --      Pain Edu? --      Excl. in [place]? --    Constitutional: Alert and oriented. Well appearing and in no acute distress.Very pleasant, escorted by her mother. Eyes: Conjunctivae are normal. PERRL. EOMI. Head: Atraumatic. Nose: No congestion/rhinnorhea. Mouth/Throat: Mucous membranes are moist.  Oropharynx non-erythematous. Neck: No stridor.   Cardiovascular: Normal rate, regular rhythm. Grossly normal heart sounds.  Good peripheral circulation. Respiratory: Normal respiratory effort.  No retractions. Lungs CTAB. Gastrointestinal: Soft and nontender except for some focal tenderness in the right lower quadrant, approximately in the area of McBurney's point though also slightly deeper in the pelvis. In addition she does report that when palpated on the left side the pain is referred over to the right lower pelvis. No distention. No abdominal bruits. No CVA tenderness. Musculoskeletal: No lower extremity tenderness nor edema.  No joint effusions. Neurologic:  Normal speech and language. No gross focal neurologic deficits are appreciated. Skin:  Skin is warm, dry and intact. No rash  noted. Psychiatric: Mood and affect are normal. Speech and behavior are normal.  ____________________________________________   LABS (all labs ordered are listed, but only abnormal results are displayed)  Labs Reviewed  COMPREHENSIVE METABOLIC PANEL - Abnormal; Notable for the following:    Potassium 3.3 (*)    Glucose, Bld 126 (*)    All other components within normal limits  URINALYSIS COMPLETEWITH MICROSCOPIC ([facility] ONLY) - Abnormal; Notable for the following:    Color, Urine YELLOW (*)  APPearance TURBID (*)    pH 9.0 (*)    Protein, ur 30 (*)    Squamous Epithelial / LPF 0-5 (*)    All other components within normal limits  LIPASE, BLOOD  CBC  CA 125  POC URINE PREG, ED  POCT PREGNANCY, URINE  TYPE AND SCREEN  ABO/RH   ____________________________________________  EKG   ____________________________________________  RADIOLOGY  CT Abdomen Pelvis W Contrast (Final result) Result time: 03/03/16 12:03:26   Final result by Rad Results In Interface (03/03/16 12:03:26)   Narrative:   CLINICAL DATA: Pt reports pain started in her right lower abd this morning and in her right lower back. Pt reports +nausea but denies vomiting. Pt denies urinary sx. Pt has hx of partial hysterectomy but still has her ovaries. No ca hx. F/U US.  EXAM: CT ABDOMEN AND PELVIS WITH CONTRAST  TECHNIQUE: Multidetector CT imaging of the abdomen and pelvis was performed using the standard protocol following bolus administration of intravenous contrast.  CONTRAST: 28mL ISOVUE-300 IOPAMIDOL (ISOVUE-300) INJECTION 61%  COMPARISON: Ultrasound 03/03/2016  FINDINGS: Lower chest: There is minimal atelectasis at both lung bases. Heart size is normal. No imaged pericardial effusion or significant coronary artery calcifications.  Upper abdomen: No focal abnormality identified within the spleen, pancreas, or adrenal glands. Gallbladder is present. Focal fatty infiltration is identified  adjacent to the falciform ligament of the liver.  Gastrointestinal tract: The stomach and small bowel loops are normal in appearance. The appendix is well seen and has a normal appearance. Colonic loops are normal in appearance. Moderate stool burden.  Pelvis: The uterus is absent. Within the right adnexal region there is a septated mass corresponding to the abnormality identified on ultrasound. Mass measures at least 5.4 x 7.9 centimeters by CT exam. Soft tissue density component superior to this mass is 3.7 x 1.8 centimeters. The region of the left ovary is normal in appearance, seen on image 59 of series 2. There is a small amount of fluid within the cul-de-sac, low in attenuation.  Retroperitoneum: There are small retroperitoneal lymph nodes. Left para aortic lymph node is 1.3 centimeters on image 56 of series 6. Other smaller periaortic and aortocaval lymph nodes are present.  Abdominal wall: Unremarkable.  Osseous structures: Note is made of transitional type anatomy at the lumbosacral junction. Mild scoliosis, convex to the left.  IMPRESSION: 1. 7.9 centimeter right adnexal mass warranting follow-up. Although the findings may be related to physiologic process, neoplasm should be considered if cyst persists at 6-12 week ultrasound. 2. Small amount of ascites. 3. Nonspecific retroperitoneal lymph nodes. 4. Normal appendix.   Electronically Signed By: Nolon Nations M.D. On: 03/03/2016 12:03          US Transvaginal Non-OB (Final result) Result time: 03/03/16 10:16:47   Final result by Rad Results In Interface (03/03/16 10:16:47)   Narrative:   CLINICAL DATA: 45 year old female with deep right lower quadrant pain  EXAM: TRANSABDOMINAL AND TRANSVAGINAL ULTRASOUND OF PELVIS  DOPPLER ULTRASOUND OF OVARIES  TECHNIQUE: Both transabdominal and transvaginal ultrasound examinations of the pelvis were performed. Transabdominal technique was performed  for global imaging of the pelvis including uterus, ovaries, adnexal regions, and pelvic cul-de-sac.  It was necessary to proceed with endovaginal exam following the transabdominal exam to visualize the right ovary. Color and duplex Doppler ultrasound was utilized to evaluate blood flow to the ovaries.  COMPARISON: None.  FINDINGS: Uterus  Surgically absent  Endometrium  Surgically absent  Right ovary  Measurements: 7.3 x 5.7 x  5.0 cm. Complex multi septated cystic mass with internal debris measures approximately 7.3 x 4.1 x 4.5 cm no definite internal vascularity.  Left ovary  Measurements: 2.1 x 1.2 x 1.3 cm. Normal appearance/no adnexal mass.  Pulsed Doppler evaluation of both ovaries demonstrates normal low-resistance arterial and venous waveforms.  Other findings  Trace free fluid adjacent to the right ovary.  IMPRESSION: 1. Positive for large (7.3 cm) complex septated cystic mass within the right ovary with internal debris but no internal vascularity. There is a small amount of adjacent free fluid. This likely represents a complex and recently hemorrhagic ovarian cyst. Recommend repeat ultrasound in 6-12 weeks. If this structure has not decreased in size at that time, pelvic MRI with and without contrast would likely become warranted. 2. No evidence of ovarian torsion at this time.   Electronically Signed By: Jacqulynn Cadet M.D. On: 03/03/2016 10:16          [place] Art/Ven Flow Abd Pelv Doppler (Final result) Result time: 03/03/16 10:16:47   Final result by Rad Results In Interface (03/03/16 10:16:47)   Narrative:   CLINICAL DATA: 45 year old female with deep right lower quadrant pain  EXAM: TRANSABDOMINAL AND TRANSVAGINAL ULTRASOUND OF PELVIS  DOPPLER ULTRASOUND OF OVARIES  TECHNIQUE: Both transabdominal and transvaginal ultrasound examinations of the pelvis were performed. Transabdominal technique was performed for global imaging  of the pelvis including uterus, ovaries, adnexal regions, and pelvic cul-de-sac.  It was necessary to proceed with endovaginal exam following the transabdominal exam to visualize the right ovary. Color and duplex Doppler ultrasound was utilized to evaluate blood flow to the ovaries.  COMPARISON: None.  FINDINGS: Uterus  Surgically absent  Endometrium  Surgically absent  Right ovary  Measurements: 7.3 x 5.7 x 5.0 cm. Complex multi septated cystic mass with internal debris measures approximately 7.3 x 4.1 x 4.5 cm no definite internal vascularity.  Left ovary  Measurements: 2.1 x 1.2 x 1.3 cm. Normal appearance/no adnexal mass.  Pulsed Doppler evaluation of both ovaries demonstrates normal low-resistance arterial and venous waveforms.  Other findings  Trace free fluid adjacent to the right ovary.  IMPRESSION: 1. Positive for large (7.3 cm) complex septated cystic mass within the right ovary with internal debris but no internal vascularity. There is a small amount of adjacent free fluid. This likely represents a complex and recently hemorrhagic ovarian cyst. Recommend repeat ultrasound in 6-12 weeks. If this structure has not decreased in size at that time, pelvic MRI with and without contrast would likely become warranted. 2. No evidence of ovarian torsion at this time.   Electronically Signed By: Jacqulynn Cadet M.D. On: 03/03/2016 10:16          US Pelvis Complete (Final result) Result time: 03/03/16 10:16:47   Final result by Rad Results In Interface (03/03/16 10:16:47)   Narrative:   CLINICAL DATA: 45 year old female with deep right lower quadrant pain  EXAM: TRANSABDOMINAL AND TRANSVAGINAL ULTRASOUND OF PELVIS  DOPPLER ULTRASOUND OF OVARIES  TECHNIQUE: Both transabdominal and transvaginal ultrasound examinations of the pelvis were performed. Transabdominal technique was performed for global imaging of the pelvis including uterus,  ovaries, adnexal regions, and pelvic cul-de-sac.  It was necessary to proceed with endovaginal exam following the transabdominal exam to visualize the right ovary. Color and duplex Doppler ultrasound was utilized to evaluate blood flow to the ovaries.  COMPARISON: None.  FINDINGS: Uterus  Surgically absent  Endometrium  Surgically absent  Right ovary  Measurements: 7.3 x 5.7 x 5.0 cm. Complex multi septated  cystic mass with internal debris measures approximately 7.3 x 4.1 x 4.5 cm no definite internal vascularity.  Left ovary  Measurements: 2.1 x 1.2 x 1.3 cm. Normal appearance/no adnexal mass.  Pulsed Doppler evaluation of both ovaries demonstrates normal low-resistance arterial and venous waveforms.  Other findings  Trace free fluid adjacent to the right ovary.  IMPRESSION: 1. Positive for large (7.3 cm) complex septated cystic mass within the right ovary with internal debris but no internal vascularity. There is a small amount of adjacent free fluid. This likely represents a complex and recently hemorrhagic ovarian cyst. Recommend repeat ultrasound in 6-12 weeks. If this structure has not decreased in size at that time, pelvic MRI with and without contrast would likely become warranted. 2. No evidence of ovarian torsion at this time.   Electronically Signed By: Jacqulynn Cadet M.D. On: 03/03/2016 10:16       ____________________________________________   PROCEDURES  Procedure(s) performed: None  Critical Care performed: No  ____________________________________________   INITIAL IMPRESSION / ASSESSMENT AND PLAN / ED COURSE  Pertinent labs & imaging results that were available during my care of the patient were reviewed by me and considered in my medical decision making (see chart for details).  No neurologic, cardiac or pulmonary symptoms. Differential diagnosis includes but is not limited to, abdominal perforation, aortic dissection,  cholecystitis, appendicitis, diverticulitis, colitis, esophagitis/gastritis, kidney stone, pyelonephritis, urinary tract infection, aortic aneurysm. All are considered in decision and treatment plan. Based upon the patient's presentation and risk factors, she seems to have very focal tenderness primarily in the right lower quadrant with a questionably positive [place]. Certainly raising concern to evaluate for right adnexal pathology, appendicitis, ovarian process. She has had a partial hysterectomy. We'll proceed with CT scan, and also obtain ultrasound to evaluate for ovarian flow cystic center. She does not have any infectious symptoms.  ----------------------------------------- 12:55 PM on 03/03/2016 -----------------------------------------  Discuss ultrasound, clinical history, CT with Dr. Ilda Basset of [facility]. He is coming see the patient in consult request we keep her nothing by mouth this time. Patient resting currently pain is controlled this time, though and she did require a notable amount of antiemetics and narcotics to control her pain. ____________________________________________   FINAL CLINICAL IMPRESSION(S) / ED DIAGNOSES  Final diagnoses:  Ovarian mass, right      Delman Kitten, MD 03/03/16 1453

## 2016-03-03 NOTE — OR Nursing (Signed)
Patient taking sips of soda tolerated well

## 2016-03-04 LAB — CA 125: CA 125: 18.5 U/mL (ref 0.0–38.1)

## 2016-03-05 ENCOUNTER — Encounter: Payer: Self-pay | Admitting: Obstetrics and Gynecology

## 2016-03-06 ENCOUNTER — Encounter: Payer: Self-pay | Admitting: Obstetrics and Gynecology

## 2016-03-06 LAB — CYTOLOGY - NON PAP

## 2016-03-06 LAB — SURGICAL PATHOLOGY

## 2016-03-19 ENCOUNTER — Encounter: Payer: Self-pay | Admitting: Family Medicine

## 2016-03-19 ENCOUNTER — Ambulatory Visit (INDEPENDENT_AMBULATORY_CARE_PROVIDER_SITE_OTHER): Payer: 59 | Admitting: Family Medicine

## 2016-03-19 VITALS — BP 116/78 | HR 68 | Temp 98.3°F | Resp 16 | Ht 64.0 in | Wt 132.0 lb

## 2016-03-19 DIAGNOSIS — R42 Dizziness and giddiness: Secondary | ICD-10-CM | POA: Diagnosis not present

## 2016-03-19 NOTE — Progress Notes (Signed)
Patient ID: Brenda Price, female   DOB: [birth year], 45 y.o.   MRN: [id number]       Patient: Brenda Price Female    DOB: 09-17-1971   45 y.o.   MRN: [id number] Visit Date: 03/19/2016  Today's Provider: Margarita Rana, MD   Chief Complaint  Patient presents with  . Dizziness   Subjective:    Dizziness This is a recurrent problem. The current episode started 1 to 4 weeks ago. The problem occurs intermittently. The problem has been gradually worsening. Associated symptoms include headaches and numbness. The symptoms are aggravated by standing.  Patient reports that she has had dizziness off and on for about 1 year. However, patient reports that her symptoms have gotten worse since her surgery about 2 weeks ago. Patient reports that her dizziness gets worse when she stands for a long time. She reports that sometimes she has to hold on to something to keep from falling. She also mentions that her symptoms are worse in the evening. Patient reports that her dizziness doesn't occur with sudden movement. She reports that her episodes last for about 5 mins.  Can not even walk on treadmill secondary to symptoms.  Has to hold on. Does not feel that she is spinning or that room is spinning.       Allergies  Allergen Reactions  . Aspirin Other (See Comments)    Causes asthma to act up   . Erythromycin Other (See Comments)    Stomach cramping    Previous Medications   BACLOFEN (LIORESAL) 10 MG TABLET    Take 10 mg by mouth 3 (three) times daily as needed for muscle spasms.    CETIRIZINE (ZYRTEC) 10 MG TABLET    Take 10 mg by mouth daily.   DOCUSATE SODIUM (COLACE) 100 MG CAPSULE    Take 1 capsule (100 mg total) by mouth 2 (two) times daily.   GABAPENTIN (NEURONTIN) 300 MG CAPSULE    Take 1 capsule (300 mg total) by mouth 2 (two) times daily.   IBUPROFEN (MOTRIN IB) 200 MG TABLET    Take 3 tablets (600 mg total) by mouth every 6 (six) hours as needed for headache, mild pain or cramping.   LEVOTHYROXINE (SYNTHROID, LEVOTHROID) 50 MCG TABLET    Take 1 tablet by mouth  daily   MONTELUKAST (SINGULAIR) 10 MG TABLET    Take 1 tablet by mouth  daily   OMEPRAZOLE (PRILOSEC) 20 MG CAPSULE    Take 20 mg by mouth daily.   OXYCODONE-ACETAMINOPHEN (ROXICET) 5-325 MG TABLET    Take 1 tablet by mouth every 6 (six) hours as needed for severe pain.   PREDNISONE (STERAPRED UNI-PAK 21 TAB) 10 MG (21) TBPK TABLET    Take 1 tablet (10 mg total) by mouth daily. Take as directed on package directions.   PROAIR HFA 108 (90 BASE) MCG/ACT INHALER    Inhale 2 puffs into the lungs every 6 (six) hours as needed for wheezing or shortness of breath.    TOPIRAMATE (TOPAMAX) 25 MG TABLET    Take 12.5 mg by mouth daily.     Review of Systems  Constitutional: Negative.   Respiratory: Negative.   Cardiovascular: Negative.   Musculoskeletal: Negative.   Neurological: Positive for dizziness, light-headedness, numbness and headaches.  Psychiatric/Behavioral: Negative.     Social History  Substance Use Topics  . Smoking status: Never Smoker   . Smokeless tobacco: Never Used  . Alcohol Use: Yes     Comment: occasional  Objective:   BP 122/70 mmHg  Pulse 68  Temp(Src) 98.3 F (36.8 C)  Resp 16  Ht 5\' 4"  (1.626 m)  Wt 132 lb (59.875 kg)  BMI 22.65 kg/m2    Physical Exam  Constitutional: She is oriented to person, place, and time. She appears well-developed and well-nourished.  HENT:  Head: Normocephalic and atraumatic.  Right Ear: External ear normal.  Left Ear: External ear normal.  Eyes: Conjunctivae and EOM are normal. Pupils are equal, round, and reactive to light.  Neck: Normal range of motion. Neck supple.  Cardiovascular: Normal rate, regular rhythm and normal heart sounds.   Pulmonary/Chest: Effort normal and breath sounds normal.  Neurological: She is alert and oriented to person, place, and time.  Psychiatric: She has a normal mood and affect. Her behavior is normal. Judgment and  thought content normal.   Orthostatic VS for the past 24 hrs:  BP- Lying Pulse- Lying BP- Sitting Pulse- Sitting BP- Standing at 0 minutes Pulse- Standing at 0 minutes  03/19/16 1223 122/80 mmHg 72 116/78 mmHg 68 112/76 mmHg 80         Assessment & Plan:     1. Dizziness New problem. Unclear etiology.  Happens with standing for awhile and walking. No SOB or other symptoms. EKG with no acute findings.  No old for comparison. Orthostatics negative.   Will refer to cardiology.  Further plan pending these results.  Call if symptoms change or any new symptoms develop.   - EKG 12-Lead - Ambulatory referral to Cardiology      Patient was seen and examined by Jerrell Belfast, MD, and scribed by Wilburt Finlay, [place].   I have reviewed the document for accuracy and completeness and I agree with above. - Jerrell Belfast, MD     Margarita Rana, MD  [facility]

## 2016-03-21 DIAGNOSIS — R42 Dizziness and giddiness: Secondary | ICD-10-CM | POA: Insufficient documentation

## 2016-05-07 ENCOUNTER — Other Ambulatory Visit: Payer: Self-pay | Admitting: Family Medicine

## 2016-05-07 DIAGNOSIS — E039 Hypothyroidism, unspecified: Secondary | ICD-10-CM

## 2016-05-11 ENCOUNTER — Encounter: Payer: Self-pay | Admitting: Physician Assistant

## 2016-05-11 ENCOUNTER — Ambulatory Visit
Admission: RE | Admit: 2016-05-11 | Discharge: 2016-05-11 | Disposition: A | Discharge status: Home / Self Care | Payer: 59 | Source: Ambulatory Visit | Attending: Physician Assistant | Admitting: Physician Assistant

## 2016-05-11 ENCOUNTER — Ambulatory Visit (INDEPENDENT_AMBULATORY_CARE_PROVIDER_SITE_OTHER): Payer: 59 | Admitting: Physician Assistant

## 2016-05-11 VITALS — BP 108/60 | HR 74 | Temp 98.3°F | Resp 16 | Wt 128.8 lb

## 2016-05-11 DIAGNOSIS — M6248 Contracture of muscle, other site: Secondary | ICD-10-CM | POA: Diagnosis not present

## 2016-05-11 DIAGNOSIS — M542 Cervicalgia: Secondary | ICD-10-CM

## 2016-05-11 DIAGNOSIS — M50322 Other cervical disc degeneration at C5-C6 level: Secondary | ICD-10-CM | POA: Insufficient documentation

## 2016-05-11 DIAGNOSIS — M62838 Other muscle spasm: Secondary | ICD-10-CM

## 2016-05-11 MED ORDER — PREDNISONE 20 MG PO TABS: ORAL_TABLET | ORAL | Status: DC

## 2016-05-11 NOTE — Progress Notes (Signed)
Patient: Brenda Price Female    DOB: [birth year]   45 y.o.   MRN: [id number] Visit Date: 05/11/2016  Today's Provider: Mar Daring, PA-C   Chief Complaint  Patient presents with  . Neck Pain   Subjective:    Neck Pain  This is a new problem. The current episode started yesterday (woke up yesterday morning with stiff neck and pain). The problem occurs constantly. The problem has been unchanged. The pain is associated with an unknown factor. The pain is present in the midline, left side and right side. The quality of the pain is described as shooting and aching. The pain is at a severity of 8/10. The pain is moderate. The symptoms are aggravated by bending, position, swallowing and twisting (Moving and talking). The pain is same all the time. Associated symptoms include pain with swallowing. Pertinent negatives include no chest pain, headaches, numbness, tingling, trouble swallowing, visual change or weakness. She has tried NSAIDs and muscle relaxants (Ibuprofen every 4 hours and one baclofen; also used topical menthol rub) for the symptoms. The treatment provided no relief.      Allergies  Allergen Reactions  . Aspirin Other (See Comments)    Causes asthma to act up   . Erythromycin Other (See Comments)    Stomach cramping    Current Meds  Medication Sig  . baclofen (LIORESAL) 10 MG tablet Take 10 mg by mouth 3 (three) times daily as needed for muscle spasms.   . cetirizine (ZYRTEC) 10 MG tablet Take 10 mg by mouth daily.  Marland Kitchen ibuprofen (MOTRIN IB) 200 MG tablet Take 3 tablets (600 mg total) by mouth every 6 (six) hours as needed for headache, mild pain or cramping.  Marland Kitchen levothyroxine (SYNTHROID, LEVOTHROID) 50 MCG tablet Take 1 tablet by mouth  daily  . montelukast (SINGULAIR) 10 MG tablet Take 1 tablet by mouth  daily  . omeprazole (PRILOSEC) 20 MG capsule Take 20 mg by mouth daily.  Marland Kitchen PROAIR HFA 108 (90 BASE) MCG/ACT inhaler Inhale 2 puffs into the lungs every 6 (six)  hours as needed for wheezing or shortness of breath.   . topiramate (TOPAMAX) 25 MG tablet Take 12.5 mg by mouth daily.     Review of Systems  Constitutional: Negative.   HENT: Negative for ear pain, sinus pressure and trouble swallowing.   Respiratory: Negative for cough, chest tightness and shortness of breath.   Cardiovascular: Negative for chest pain, palpitations and leg swelling.  Gastrointestinal: Negative for nausea.  Genitourinary: Positive for dysuria.  Musculoskeletal: Positive for neck pain and neck stiffness.  Neurological: Negative for tingling, weakness, numbness and headaches.    Social History  Substance Use Topics  . Smoking status: Never Smoker   . Smokeless tobacco: Never Used  . Alcohol Use: Yes     Comment: occasional   Objective:   BP 108/60 mmHg  Pulse 74  Temp(Src) 98.3 F (36.8 C) (Oral)  Resp 16  Wt 128 lb 12.8 oz (58.423 kg)  Physical Exam  Constitutional: She appears well-developed and well-nourished. No distress.  Neck: Neck supple. Muscular tenderness present. No spinous process tenderness present. No rigidity. Decreased range of motion (decreased flexion and extension; mildly decreased rotation; lat flex ok) present. No tracheal deviation, no edema and no erythema present. No Brudzinski's sign and no Kernig's sign noted. No thyromegaly present.  Cardiovascular: Normal rate, regular rhythm and normal heart sounds.  Exam reveals no gallop and no friction rub.  No murmur heard. Pulmonary/Chest: Effort normal and breath sounds normal. No respiratory distress. She has no wheezes. She has no rales.  Lymphadenopathy:    She has no cervical adenopathy.  Skin: She is not diaphoretic.  Vitals reviewed.     Assessment & Plan:     1. Neck muscle spasm Continue baclofen and will add prednisone. Patient gets upset stomach with NSAIDs. Apply heating pad to area. Massage may help. Will get xray as patient is concerned something may be misaligned. No  deformity noted on exam. Will f/u pending xray results.  - predniSONE (DELTASONE) 20 MG tablet; Take 3 tabs PO x 2 days,  2 tabs PO x 2 days, then 1 tab until completed.  Dispense: 15 tablet; Refill: 0  2. Cervical spine pain See above medical treatment plan. - DG Cervical Spine Complete; Future       Mar Daring, PA-C  [facility]

## 2016-05-11 NOTE — Patient Instructions (Signed)
Acute Torticollis °Torticollis is a condition in which the muscles of the neck tighten (contract) abnormally, causing the neck to twist and the head to move into an unnatural position. Torticollis that develops suddenly is called acute torticollis. If torticollis becomes chronic and is left untreated, the face and neck can become deformed. °CAUSES °This condition may be caused by: °· Sleeping in an awkward position (common). °· Extending or twisting the neck muscles beyond their normal position. °· Infection. °In some cases, the cause may not be known. °SYMPTOMS °Symptoms of this condition include: °· An unnatural position of the head. °· Neck pain. °· A limited ability to move the neck. °· Twisting of the neck to one side. °DIAGNOSIS °This condition is diagnosed with a physical exam. You may also have imaging tests, such as an X-ray, CT scan, or MRI. °TREATMENT °Treatment for this condition involves trying to relax the neck muscles. It may include: °· Medicines or shots. °· Physical therapy. °· Surgery. This may be done in severe cases. °HOME CARE INSTRUCTIONS °· Take medicines only as directed by your health care provider. °· Do stretching exercises and massage your neck as directed by your health care provider. °· Keep all follow-up visits as directed by your health care provider. This is important. °SEEK MEDICAL CARE IF: °· You develop a fever. °SEEK IMMEDIATE MEDICAL CARE IF: °· You develop difficulty breathing. °· You develop noisy breathing (stridor). °· You start drooling. °· You have trouble swallowing or have pain with swallowing. °· You develop numbness or weakness in your hands or feet. °· You have changes in your speech, understanding, or vision. °· Your pain gets worse. °  °This information is not intended to replace advice given to you by your health care provider. Make sure you discuss any questions you have with your health care provider. °  °Document Released: 10/26/2000 Document Revised:  03/15/2015 Document Reviewed: 10/25/2014 °Elsevier Interactive Patient Education ©2016 Elsevier Inc. ° °

## 2016-05-12 ENCOUNTER — Telehealth: Payer: Self-pay

## 2016-05-12 NOTE — Telephone Encounter (Signed)
LMTCB  Thanks,  -Doroteo Nickolson 

## 2016-05-12 NOTE — Telephone Encounter (Signed)
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 This may continue to progress and worsen. I recommend a referral to a back/spine specialist for further evaluation. PT may also benefit some. Please let me know which you prefer and I can place orders. You can send me a message back through mychart and I will take care of it.

## 2016-05-16 ENCOUNTER — Encounter: Payer: Self-pay | Admitting: Physician Assistant

## 2016-05-18 NOTE — Telephone Encounter (Signed)
Patient email Tawanna Sat through [place].  Thanks,  -Joseline

## 2016-05-30 ENCOUNTER — Other Ambulatory Visit: Payer: Self-pay | Admitting: Family Medicine

## 2016-05-30 DIAGNOSIS — J302 Other seasonal allergic rhinitis: Secondary | ICD-10-CM

## 2016-05-31 NOTE — Telephone Encounter (Signed)
LOV with you on 05/11/2016. Renaldo Fiddler, CMA

## 2016-06-18 ENCOUNTER — Other Ambulatory Visit: Payer: Self-pay | Admitting: Family Medicine

## 2016-06-18 DIAGNOSIS — J302 Other seasonal allergic rhinitis: Secondary | ICD-10-CM

## 2016-09-19 ENCOUNTER — Encounter: Payer: Self-pay | Admitting: Physician Assistant

## 2016-09-19 ENCOUNTER — Ambulatory Visit (INDEPENDENT_AMBULATORY_CARE_PROVIDER_SITE_OTHER): Payer: 59 | Admitting: Physician Assistant

## 2016-09-19 VITALS — BP 108/70 | HR 72 | Temp 98.0°F | Resp 16 | Wt 130.0 lb

## 2016-09-19 DIAGNOSIS — J019 Acute sinusitis, unspecified: Secondary | ICD-10-CM | POA: Diagnosis not present

## 2016-09-19 DIAGNOSIS — R05 Cough: Secondary | ICD-10-CM | POA: Diagnosis not present

## 2016-09-19 DIAGNOSIS — R059 Cough, unspecified: Secondary | ICD-10-CM

## 2016-09-19 LAB — POCT INFLUENZA A/B
Influenza A, POC: NEGATIVE
Influenza B, POC: NEGATIVE

## 2016-09-19 MED ORDER — AMOXICILLIN-POT CLAVULANATE 875-125 MG PO TABS: 1.0000 | ORAL_TABLET | Freq: Two times a day (BID) | ORAL | 0 refills | Status: AC

## 2016-09-19 NOTE — Progress Notes (Signed)
[facility]  Chief Complaint  Patient presents with  . Sinusitis    Started about two weeks ago.  Marland Kitchen URI    Subjective:    Patient ID: Brenda Price, female    DOB: May 14, 1971, 45 y.o.   MRN: [id number]  Upper Respiratory Infection: Brenda Price is a  44 y.o. female with a past medical history significant for Asthma using proair once daily, Allergic Rhinitis complaining of symptoms of a URI, possible sinusitis. Symptoms include congestion, cough, plugged sensation in both ears and sore throat. Onset of symptoms was 10 days ago, gradually worsening since that time. Felt like she was getting better, then got worse. She also c/o bilateral ear pressure/pain, congestion, lightheadedness, nasal congestion, post nasal drip and productive cough with  brown colored sputum for the past 10 days .  She is drinking plenty of fluids. Evaluation to date: none. Treatment to date: cough suppressants. The treatment has provided minimal.   Review of Systems  Constitutional: Positive for fatigue. Negative for activity change, appetite change, chills, diaphoresis, fever and unexpected weight change.  HENT: Positive for congestion, nosebleeds, rhinorrhea, sinus pain, sinus pressure, sore throat, trouble swallowing and voice change. Negative for ear discharge, ear pain (Pt reports ear pressure), hearing loss, postnasal drip, sneezing and tinnitus.   Eyes: Positive for discharge and itching. Negative for photophobia, pain, redness and visual disturbance.  Respiratory: Positive for cough, chest tightness, shortness of breath and wheezing. Negative for apnea, choking and stridor.   Cardiovascular: Negative.   Gastrointestinal: Negative.   Allergic/Immunologic: Positive for environmental allergies.  Neurological: Positive for light-headedness and headaches. Negative for dizziness.       Objective:   BP 108/70 (BP Location: Left Arm, Patient Position: Sitting, Cuff  Size: Normal)   Pulse 72   Temp 98 F (36.7 C) (Oral)   Resp 16   Wt 130 lb (59 kg)   SpO2 99%   BMI 22.31 kg/m   Patient Active Problem List   Diagnosis Date Noted  . Orthostatic dizziness 03/21/2016  . Dizziness 03/21/2016  . Pelvic mass in female 03/03/2016  . Intra-abdominal and pelvic swelling, mass and lump, unspecified site 03/03/2016  . 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 Family planning 09/29/2015  . Acid reflux 09/29/2015  . Cardiac murmur 09/29/2015  . Excess, menstruation 09/29/2015  . Hemorrhoid 09/29/2015  . 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 Embedded toenail 09/29/2015  . Irregular bleeding 09/29/2015  . Headache, migraine 09/29/2015  . 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 Cervical pain 09/29/2015  . Neutropenia ([place]) 09/29/2015  . Fungal infection of nail 09/29/2015  . Allergic rhinitis, seasonal 09/29/2015  . 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 Excessive and frequent menstruation 09/29/2015  . Gastro-esophageal reflux disease without esophagitis 09/29/2015  . Asthma 08/29/2015    Outpatient Encounter Prescriptions as of 09/19/2016  Medication Sig  . baclofen (LIORESAL) 10 MG tablet Take 10 mg by mouth 3 (three) times daily as needed for muscle spasms.   . cetirizine (ZYRTEC) 10 MG tablet Take 10 mg by mouth daily.  Marland Kitchen ibuprofen (MOTRIN IB) 200 MG tablet Take 3 tablets (600 mg total) by mouth every 6 (six) hours as needed for headache, mild pain or cramping.  Marland Kitchen levocetirizine (XYZAL) 5 MG tablet Take 1 tablet by mouth  daily  . levothyroxine (SYNTHROID, LEVOTHROID) 50 MCG tablet Take 1 tablet by mouth  daily  . montelukast (SINGULAIR) 10 MG tablet Take 1  tablet by mouth  daily  . omeprazole (PRILOSEC) 20 MG capsule Take 20 mg by mouth daily.  Marland Kitchen PROAIR HFA 108 (90 BASE) MCG/ACT inhaler Inhale 2 puffs into the lungs every 6 (six) hours as needed for wheezing or shortness of breath.   . topiramate (TOPAMAX) 25 MG  tablet Take 12.5 mg by mouth daily.   Marland Kitchen amoxicillin-clavulanate (AUGMENTIN) 875-125 MG tablet Take 1 tablet by mouth 2 (two) times daily.  . [DISCONTINUED] predniSONE (DELTASONE) 20 MG tablet Take 3 tabs PO x 2 days,  2 tabs PO x 2 days, then 1 tab until completed.   No facility-administered encounter medications on file as of 09/19/2016.     Allergies  Allergen Reactions  . Aspirin Other (See Comments)    Causes asthma to act up   . Erythromycin Other (See Comments)    Stomach cramping        Physical Exam  Constitutional: She is oriented to person, place, and time. She appears well-developed and well-nourished. No distress.  HENT:  Right Ear: External ear normal.  Left Ear: External ear normal.  Nose: Right sinus exhibits maxillary sinus tenderness and frontal sinus tenderness. Left sinus exhibits maxillary sinus tenderness and frontal sinus tenderness.  Mouth/Throat: Oropharynx is clear and moist. No oropharyngeal exudate, posterior oropharyngeal edema or posterior oropharyngeal erythema.  Tms opaque bilaterally   Eyes: Conjunctivae are normal. Right eye exhibits no discharge. Left eye exhibits no discharge.  Neck: Neck supple.  Cardiovascular: Normal rate and regular rhythm.   Pulmonary/Chest: Effort normal and breath sounds normal.  Lymphadenopathy:    She has cervical adenopathy.  Neurological: She is alert and oriented to person, place, and time.  Skin: Skin is warm and dry. She is not diaphoretic.  Psychiatric: She has a normal mood and affect. Her behavior is normal.       Assessment & Plan:   Problem List Items Addressed This Visit    None    Visit Diagnoses    Acute non-recurrent sinusitis, unspecified location    -  Primary   Relevant Medications   amoxicillin-clavulanate (AUGMENTIN) 875-125 MG tablet      Patient is 45 y/o presenting with bacterial sinusitis. Eval and treat as above. Rapid flu in office was negative. Instructed patient to take proair Q4H  for the next 3-4 days. Call back if not feeling better. Encouraged patient to schedule CPE.   Recommend rest, fluids, frequent hand washing. Work note provided  Patient Instructions  Sinusitis, Adult Sinusitis is redness, soreness, and inflammation of the paranasal sinuses. Paranasal sinuses are air pockets within the bones of your face. They are located beneath your eyes, in the middle of your forehead, and above your eyes. In healthy paranasal sinuses, mucus is able to drain out, and air is able to circulate through them by way of your nose. However, when your paranasal sinuses are inflamed, mucus and air can become trapped. This can allow bacteria and other germs to grow and cause infection. Sinusitis can develop quickly and last only a short time (acute) or continue over a long period (chronic). Sinusitis that lasts for more than 12 weeks is considered chronic. CAUSES Causes of sinusitis include:  Allergies.  Structural abnormalities, such as displacement of the cartilage that separates your nostrils (deviated septum), which can decrease the air flow through your nose and sinuses and affect sinus drainage.  Functional abnormalities, such as when the small hairs (cilia) that line your sinuses and help remove mucus do not work  properly or are not present. SIGNS AND SYMPTOMS Symptoms of acute and chronic sinusitis are the same. The primary symptoms are pain and pressure around the affected sinuses. Other symptoms include:  Upper toothache.  Earache.  Headache.  Bad breath.  Decreased sense of smell and taste.  A cough, which worsens when you are lying flat.  Fatigue.  Fever.  Thick drainage from your nose, which often is green and may contain pus (purulent).  Swelling and warmth over the affected sinuses. DIAGNOSIS Your health care provider will perform a physical exam. During your exam, your health care provider may perform any of the following to help determine if you have  acute sinusitis or chronic sinusitis:  Look in your nose for signs of abnormal growths in your nostrils (nasal polyps).  Tap over the affected sinus to check for signs of infection.  View the inside of your sinuses using an imaging device that has a light attached (endoscope). If your health care provider suspects that you have chronic sinusitis, one or more of the following tests may be recommended:  Allergy tests.  Nasal culture. A sample of mucus is taken from your nose, sent to a lab, and screened for bacteria.  Nasal cytology. A sample of mucus is taken from your nose and examined by your health care provider to determine if your sinusitis is related to an allergy. TREATMENT Most cases of acute sinusitis are related to a viral infection and will resolve on their own within 10 days. Sometimes, medicines are prescribed to help relieve symptoms of both acute and chronic sinusitis. These may include pain medicines, decongestants, nasal steroid sprays, or saline sprays. However, for sinusitis related to a bacterial infection, your health care provider will prescribe antibiotic medicines. These are medicines that will help kill the bacteria causing the infection. Rarely, sinusitis is caused by a fungal infection. In these cases, your health care provider will prescribe antifungal medicine. For some cases of chronic sinusitis, surgery is needed. Generally, these are cases in which sinusitis recurs more than 3 times per year, despite other treatments. HOME CARE INSTRUCTIONS  Drink plenty of water. Water helps thin the mucus so your sinuses can drain more easily.  Use a humidifier.  Inhale steam 3-4 times a day (for example, sit in the bathroom with the shower running).  Apply a warm, moist washcloth to your face 3-4 times a day, or as directed by your health care provider.  Use saline nasal sprays to help moisten and clean your sinuses.  Take medicines only as directed by your health care  provider.  If you were prescribed either an antibiotic or antifungal medicine, finish it all even if you start to feel better. SEEK IMMEDIATE MEDICAL CARE IF:  You have increasing pain or severe headaches.  You have nausea, vomiting, or drowsiness.  You have swelling around your face.  You have vision problems.  You have a stiff neck.  You have difficulty breathing.   This information is not intended to replace advice given to you by your health care provider. Make sure you discuss any questions you have with your health care provider.   Document Released: 10/29/2005 Document Revised: 11/19/2014 Document Reviewed: 11/13/2011 Elsevier Interactive Patient Education [employer].     The entirety of the information documented in the History of Present Illness, Review of Systems and Physical Exam were personally obtained by me. Portions of this information were initially documented by Ashley Royalty, CMA and reviewed by me for thoroughness  and accuracy.

## 2016-09-19 NOTE — Patient Instructions (Signed)

## 2016-10-09 ENCOUNTER — Ambulatory Visit (INDEPENDENT_AMBULATORY_CARE_PROVIDER_SITE_OTHER): Payer: 59 | Admitting: Physician Assistant

## 2016-10-09 ENCOUNTER — Encounter: Payer: Self-pay | Admitting: Physician Assistant

## 2016-10-09 VITALS — BP 118/68 | HR 64 | Temp 98.5°F | Resp 16 | Ht 64.0 in | Wt 131.0 lb

## 2016-10-09 DIAGNOSIS — Z1239 Encounter for other screening for malignant neoplasm of breast: Secondary | ICD-10-CM

## 2016-10-09 DIAGNOSIS — Z0000 Encounter for general adult medical examination without abnormal findings: Secondary | ICD-10-CM

## 2016-10-09 DIAGNOSIS — G43909 Migraine, unspecified, not intractable, without status migrainosus: Secondary | ICD-10-CM

## 2016-10-09 DIAGNOSIS — J45909 Unspecified asthma, uncomplicated: Secondary | ICD-10-CM | POA: Diagnosis not present

## 2016-10-09 DIAGNOSIS — Z1231 Encounter for screening mammogram for malignant neoplasm of breast: Secondary | ICD-10-CM

## 2016-10-09 DIAGNOSIS — I341 Nonrheumatic mitral (valve) prolapse: Secondary | ICD-10-CM

## 2016-10-09 DIAGNOSIS — E039 Hypothyroidism, unspecified: Secondary | ICD-10-CM | POA: Diagnosis not present

## 2016-10-09 NOTE — Patient Instructions (Signed)

## 2016-10-09 NOTE — Progress Notes (Signed)
Patient: Brenda Price, Female    DOB: 1971/02/03, 45 y.o.   MRN: [id number] Visit Date: 10/09/2016  Today's Provider: Trinna Post, PA-C   Chief Complaint  Patient presents with  . Annual Exam   Subjective:    Annual physical exam Brenda Price is a 45 y.o. female who presents today for health maintenance and complete physical. 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  She has been married for nine years. She has an 66 year old daughter and an 68 year old step son.    She feels fairly well.  Pt was seen in clinic a couple weeks ago for sinusitis, and is overall feeling better with some residual cough and congestion. She does have asthma and is on Proair.  She is using her rescue inhaler twice a day, which is more often than usual.   She reports exercising regularly. She reports she is sleeping well.  She does have a history of migraine headaches. She reports she is doing well on prophylactic Topamax therapy. She says she has 4-6 headaches per year. She feels her chronic neck pain/stiffness contributes to this. She also takes Baclofen when she has headaches to help with neck stiffness. She does not wish for further workup of neck pain/stiffness.  She has a history of irregular uterine bleeding. She had a hysterectomy three years ago for this, with retained cervix and ovaries. Her last cotesting was in 2014, with normal PAP and negative HPV. She currently has one sexual partner, her husband. She was seeing [facility] for gynecologic care and annual mammograms, but not longer sees them. No history of abnormal mammogram. No personal or family history of breast cancer.  She was hospitalized in April 2017 for a cystic ovary which was then removed. She has no history of cervical cancer. There is a history of uterine cancer in a paternal grandmother at age 88. She no longer has menstrual cycles.  She does have hypothyroidism, and is  asymptomatic on 50 mcg Synthroid.  There is a family history of heart disease on her father's side. Her father died of 39 age 91, her paternal uncle died of MI at age 41. She has a history of orthostatic dizziness with negative cardiac workup. She sees Dr. Saralyn Pilar at [place]. History of mitral regurgitation.     There is no family history of lung cancer.  She has no personal or family history of bowel cancer. She had a colonoscopy around ten years ago for abdominal cramping which showed diverticuloses but no polyps. No blood in stool or bowel issues. No bladder issues. -----------------------------------------------------------------   Review of Systems  Constitutional: Negative.   HENT: Positive for congestion, sneezing and tinnitus.   Eyes: Negative.   Respiratory: Positive for cough, chest tightness and shortness of breath.   Cardiovascular: Negative.   Gastrointestinal: Negative.   Endocrine: Negative.   Genitourinary: Negative.   Musculoskeletal: Positive for myalgias, neck pain and neck stiffness.       Chronic Issue, has worsening in the last few years but is stable.   Skin: Negative.   Allergic/Immunologic: Positive for environmental allergies.  Neurological: Negative.   Hematological: Negative.   Psychiatric/Behavioral: Negative.     Social History      She  reports that she has never smoked. She has never used smokeless tobacco. She reports that she drinks alcohol. She reports that she does not use drugs.  Social History   Social History  . Marital status: Married    Spouse name: N/A  . Number of children: N/A  . Years of education: N/A   Social History Main Topics  . Smoking status: Never Smoker  . Smokeless tobacco: Never Used  . Alcohol use Yes     Comment: occasional  . Drug use: No  . Sexual activity: Not Asked   Other Topics Concern  . None   Social History Narrative  . None    Past Medical History:  Diagnosis Date  . Allergy   .  Asthma   . Fibromyalgia   . Heart murmur   . IBS (irritable bowel syndrome)   . Thyroid disease      Patient Active Problem List   Diagnosis Date Noted  . Orthostatic dizziness 03/21/2016  . Dizziness 03/21/2016  . Pelvic mass in female 03/03/2016  . Intra-abdominal and pelvic swelling, mass and lump, unspecified site 03/03/2016  . 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 Family planning 09/29/2015  . Acid reflux 09/29/2015  . Cardiac murmur 09/29/2015  . Excess, menstruation 09/29/2015  . Hemorrhoid 09/29/2015  . 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 Embedded toenail 09/29/2015  . Irregular bleeding 09/29/2015  . Headache, migraine 09/29/2015  . 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 Cervical pain 09/29/2015  . Neutropenia ([place]) 09/29/2015  . Fungal infection of nail 09/29/2015  . Allergic rhinitis, seasonal 09/29/2015  . 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 Excessive and frequent menstruation 09/29/2015  . Gastro-esophageal reflux disease without esophagitis 09/29/2015  . Asthma 08/29/2015    Past Surgical History:  Procedure Laterality Date  . CESAREAN SECTION    . KNEE ARTHROSCOPY    . LAPAROSCOPIC TOTAL HYSTERECTOMY     with BS. Dr. Norton [facility]  . LAPAROSCOPY Right 03/03/2016   Procedure:  diagnostic laparoscopy and right oophrectomy;  Surgeon: Aletha Halim, MD;  Location: [facility] ORS;  Service: Gynecology;  Laterality: Right;    Family History        Family Status  Relation Status  . Father Deceased  . Mother Alive  . Sister Alive  . Brother Alive        Her family history includes Arthritis in her mother; Heart attack in her father; Hyperlipidemia in her mother; Thyroid disease in her brother.     Allergies  Allergen Reactions  . Aspirin Other (See Comments)    Causes asthma to act up   . Erythromycin Other (See Comments)    Stomach cramping      Current Outpatient Prescriptions:  .  baclofen (LIORESAL)  10 MG tablet, Take 10 mg by mouth 3 (three) times daily as needed for muscle spasms. , Disp: , Rfl:  .  cetirizine (ZYRTEC) 10 MG tablet, Take 10 mg by mouth daily., Disp: , Rfl:  .  ibuprofen (MOTRIN IB) 200 MG tablet, Take 3 tablets (600 mg total) by mouth every 6 (six) hours as needed for headache, mild pain or cramping., Disp: 30 tablet, Rfl: 0 .  levocetirizine (XYZAL) 5 MG tablet, Take 1 tablet by mouth  daily, Disp: 90 tablet, Rfl: 1 .  levothyroxine (SYNTHROID, LEVOTHROID) 50 MCG tablet, Take 1 tablet by mouth  daily, Disp: 90 tablet, Rfl: 1 .  montelukast (SINGULAIR) 10 MG tablet, Take 1 tablet by mouth  daily, Disp: 90 tablet, Rfl: 3 .  omeprazole (PRILOSEC) 20 MG capsule, Take 20 mg by mouth daily., Disp: , Rfl:  .  PROAIR HFA 108 (90 BASE) MCG/ACT inhaler, Inhale 2 puffs into the lungs every 6 (six) hours as needed for wheezing or shortness of breath. , Disp: , Rfl:  .  topiramate (TOPAMAX) 25 MG tablet, Take 12.5 mg by mouth daily. , Disp: , Rfl:    Patient Care Team: Mar Daring, PA-C as PCP - General (Family Medicine)      Objective:   Vitals: BP 118/68 (BP Location: Left Arm, Patient Position: Sitting, Cuff Size: Normal)   Pulse 64   Temp 98.5 F (36.9 C) (Oral)   Resp 16   Ht '5\' 4"'  (1.626 m)   Wt 131 lb (59.4 kg)   BMI 22.49 kg/m    Physical Exam  Constitutional: She is oriented to person, place, and time. She appears well-developed and well-nourished.  HENT:  Right Ear: Tympanic membrane, external ear and ear canal normal.  Left Ear: Tympanic membrane, external ear and ear canal normal.  Mouth/Throat: Uvula is midline and mucous membranes are normal. No oropharyngeal exudate.  Eyes: Conjunctivae and EOM are normal. Pupils are equal, round, and reactive to light.  Neck: Neck supple.  Cardiovascular: Normal rate and regular rhythm.   Pulmonary/Chest: Effort normal and breath sounds normal. Right breast exhibits no inverted nipple, no mass, no nipple  discharge, no skin change and no tenderness. Left breast exhibits no inverted nipple, no mass, no nipple discharge, no skin change and no tenderness. Breasts are symmetrical.  Chaperoned breast exam   Abdominal: Soft. Bowel sounds are normal. She exhibits no distension. There is no tenderness. There is no rebound.  Lymphadenopathy:    She has no cervical adenopathy.  Neurological: She is alert and oriented to person, place, and time.  Skin: Skin is warm and dry.  Psychiatric: She has a normal mood and affect. Her behavior is normal.     Depression Screen PHQ 2/9 Scores 10/09/2016 10/04/2015  PHQ - 2 Score 0 0      Assessment & Plan:     Routine Health Maintenance and Physical Exam  Exercise Activities and Dietary recommendations Goals    None      Immunization History  Administered Date(s) Administered  . Influenza,inj,Quad PF,36+ Mos 09/05/2015  . Td 07/07/2002  . Tdap 07/07/2012    Health Maintenance  Topic Date Due  . HIV Screening  10/23/1987  . INFLUENZA VACCINE  06/12/2016  . PAP SMEAR  08/29/2016  . TETANUS/TDAP  07/07/2022     Discussed health benefits of physical activity, and encouraged her to engage in regular exercise appropriate for her age and condition.    Problem List Items Addressed This Visit      Cardiovascular and Mediastinum   Headache, migraine   Billowing mitral valve     Respiratory   Asthma     Endocrine   Adult hypothyroidism   Relevant Orders   TSH    Other Visit Diagnoses    Annual physical exam    -  Primary   Relevant Orders   CBC with Differential/Platelet   Comprehensive metabolic panel   TSH   Lipid panel   Breast cancer screening       Relevant Orders   MM Digital Screening     Annual Physical Exam  Screening labs as above. Patient due for PAP in 2019.  Asthma  Increased symptoms with recent URI, but otherwise stable.  Hypothyroidism  Check TSH as above. Has been stale on 50 mcg  Synthroid  Headache  Stable on Topamax.  Breast Cancer Screening  Ordered mammogram. Patient will schedule at [facility].  Return in about 1 year (around 10/09/2017) for CPE.  The entirety of the information documented in the History of Present Illness, Review of Systems and Physical Exam were personally obtained by me. Portions of this information were initially documented by Ashley Royalty, CMA and reviewed by me for thoroughness and accuracy.   Patient Instructions  Health Maintenance, Female Introduction Adopting a healthy lifestyle and getting preventive care can go a long way to promote health and wellness. Talk with your health care provider about what schedule of regular examinations is right for you. This is a good chance for you to check in with your provider about disease prevention and staying healthy. In between checkups, there are plenty of things you can do on your own. Experts have done a lot of research about which lifestyle changes and preventive measures are most likely to keep you healthy. Ask your health care provider for more information. Weight and diet Eat a healthy diet  Be sure to include plenty of vegetables, fruits, low-fat dairy products, and lean protein.  Do not eat a lot of foods high in solid fats, added sugars, or salt.  Get regular exercise. This is one of the most important things you can do for your health.  Most adults should exercise for at least 150 minutes each week. The exercise should increase your heart rate and make you sweat (moderate-intensity exercise).  Most adults should also do strengthening exercises at least twice a week. This is in addition to the moderate-intensity exercise. Maintain a healthy weight  Body mass index (BMI) is a measurement that can be used to identify possible weight problems. It estimates body fat based on height and weight. Your health care provider can help determine your BMI and help you achieve or maintain a  healthy weight.  For females 85 years of age and older:  A BMI below 18.5 is considered underweight.  A BMI of 18.5 to 24.9 is normal.  A BMI of 25 to 29.9 is considered overweight.  A BMI of 30 and above is considered obese. Watch levels of cholesterol and blood lipids  You should start having your blood tested for lipids and cholesterol at 44 years of age, then have this test every 5 years.  You may need to have your cholesterol levels checked more often if:  Your lipid or cholesterol levels are high.  You are older than 44 years of age.  You are at high risk for heart disease. Cancer screening Lung Cancer  Lung cancer screening is recommended for adults 50-29 years old who are at high risk for lung cancer because of a history of smoking.  A yearly low-dose CT scan of the lungs is recommended for people who:  Currently smoke.  Have quit within the past 15 years.  Have at least a 30-pack-year history of smoking. A pack year is smoking an average of one pack of cigarettes a day for 1 year.  Yearly screening should continue until it has been 15 years since you quit.  Yearly screening should stop if you develop a health problem that would prevent you from having lung cancer treatment. Breast Cancer  Practice breast self-awareness. This means understanding how your breasts normally appear and feel.  It also means doing regular breast self-exams. Let your health care provider know about any changes, no matter how small.  If you are in your 20s or 30s, you should have a  clinical breast exam (CBE) by a health care provider every 1-3 years as part of a regular health exam.  If you are 25 or older, have a CBE every year. Also consider having a breast X-ray (mammogram) every year.  If you have a family history of breast cancer, talk to your health care provider about genetic screening.  If you are at high risk for breast cancer, talk to your health care provider about having  an MRI and a mammogram every year.  Breast cancer gene (BRCA) assessment is recommended for women who have family members with BRCA-related cancers. BRCA-related cancers include:  Breast.  Ovarian.  Tubal.  Peritoneal cancers.  Results of the assessment will determine the need for genetic counseling and BRCA1 and BRCA2 testing. Cervical Cancer  Your health care provider may recommend that you be screened regularly for cancer of the pelvic organs (ovaries, uterus, and vagina). This screening involves a pelvic examination, including checking for microscopic changes to the surface of your cervix (Pap test). You may be encouraged to have this screening done every 3 years, beginning at age 89.  For women ages 8-65, health care providers may recommend pelvic exams and Pap testing every 3 years, or they may recommend the Pap and pelvic exam, combined with testing for human papilloma virus (HPV), every 5 years. Some types of HPV increase your risk of cervical cancer. Testing for HPV may also be done on women of any age with unclear Pap test results.  Other health care providers may not recommend any screening for nonpregnant women who are considered low risk for pelvic cancer and who do not have symptoms. Ask your health care provider if a screening pelvic exam is right for you.  If you have had past treatment for cervical cancer or a condition that could lead to cancer, you need Pap tests and screening for cancer for at least 20 years after your treatment. If Pap tests have been discontinued, your risk factors (such as having a new sexual partner) need to be reassessed to determine if screening should resume. Some women have medical problems that increase the chance of getting cervical cancer. In these cases, your health care provider may recommend more frequent screening and Pap tests. Colorectal Cancer  This type of cancer can be detected and often prevented.  Routine colorectal cancer screening  usually begins at 44 years of age and continues through 44 years of age.  Your health care provider may recommend screening at an earlier age if you have risk factors for colon cancer.  Your health care provider may also recommend using home test kits to check for hidden blood in the stool.  A small camera at the end of a tube can be used to examine your colon directly (sigmoidoscopy or colonoscopy). This is done to check for the earliest forms of colorectal cancer.  Routine screening usually begins at age 33.  Direct examination of the colon should be repeated every 5-10 years through 44 years of age. However, you may need to be screened more often if early forms of precancerous polyps or small growths are found. Skin Cancer  Check your skin from head to toe regularly.  Tell your health care provider about any new moles or changes in moles, especially if there is a change in a mole's shape or color.  Also tell your health care provider if you have a mole that is larger than the size of a pencil eraser.  Always use sunscreen. Apply sunscreen  liberally and repeatedly throughout the day.  Protect yourself by wearing long sleeves, pants, a wide-brimmed hat, and sunglasses whenever you are outside. Heart disease, diabetes, and high blood pressure  High blood pressure causes heart disease and increases the risk of stroke. High blood pressure is more likely to develop in:  People who have blood pressure in the high end of the normal range (130-139/85-89 mm Hg).  People who are overweight or obese.  People who are African American.  If you are 75-3 years of age, have your blood pressure checked every 3-5 years. If you are 38 years of age or older, have your blood pressure checked every year. You should have your blood pressure measured twice-once when you are at a hospital or clinic, and once when you are not at a hospital or clinic. Record the average of the two measurements. To check your  blood pressure when you are not at a hospital or clinic, you can use:  An automated blood pressure machine at a pharmacy.  A home blood pressure monitor.  If you are between 11 years and 79 years old, ask your health care provider if you should take aspirin to prevent strokes.  Have regular diabetes screenings. This involves taking a blood sample to check your fasting blood sugar level.  If you are at a normal weight and have a low risk for diabetes, have this test once every three years after 44 years of age.  If you are overweight and have a high risk for diabetes, consider being tested at a younger age or more often. Preventing infection Hepatitis B  If you have a higher risk for hepatitis B, you should be screened for this virus. You are considered at high risk for hepatitis B if:  You were born in a country where hepatitis B is common. Ask your health care provider which countries are considered high risk.  Your parents were born in a high-risk country, and you have not been immunized against hepatitis B (hepatitis B vaccine).  You have HIV or AIDS.  You use needles to inject street drugs.  You live with someone who has hepatitis B.  You have had sex with someone who has hepatitis B.  You get hemodialysis treatment.  You take certain medicines for conditions, including cancer, organ transplantation, and autoimmune conditions. Hepatitis C  Blood testing is recommended for:  Everyone born from [birth year] through 1964.  Anyone with known risk factors for hepatitis C. Sexually transmitted infections (STIs)  You should be screened for sexually transmitted infections (STIs) including gonorrhea and chlamydia if:  You are sexually active and are younger than 44 years of age.  You are older than 44 years of age and your health care provider tells you that you are at risk for this type of infection.  Your sexual activity has changed since you were last screened and you are at an  increased risk for chlamydia or gonorrhea. Ask your health care provider if you are at risk.  If you do not have HIV, but are at risk, it may be recommended that you take a prescription medicine daily to prevent HIV infection. This is called pre-exposure prophylaxis (PrEP). You are considered at risk if:  You are sexually active and do not regularly use condoms or know the HIV status of your partner(s).  You take drugs by injection.  You are sexually active with a partner who has HIV. Talk with your health care provider about whether you are at  high risk of being infected with HIV. If you choose to begin PrEP, you should first be tested for HIV. You should then be tested every 3 months for as long as you are taking PrEP. Pregnancy  If you are premenopausal and you may become pregnant, ask your health care provider about preconception counseling.  If you may become pregnant, take 400 to 800 micrograms (mcg) of folic acid every day.  If you want to prevent pregnancy, talk to your health care provider about birth control (contraception). Osteoporosis and menopause  Osteoporosis is a disease in which the bones lose minerals and strength with aging. This can result in serious bone fractures. Your risk for osteoporosis can be identified using a bone density scan.  If you are 30 years of age or older, or if you are at risk for osteoporosis and fractures, ask your health care provider if you should be screened.  Ask your health care provider whether you should take a calcium or vitamin D supplement to lower your risk for osteoporosis.  Menopause may have certain physical symptoms and risks.  Hormone replacement therapy may reduce some of these symptoms and risks. Talk to your health care provider about whether hormone replacement therapy is right for you. Follow these instructions at home:  Schedule regular health, dental, and eye exams.  Stay current with your immunizations.  Do not use  any tobacco products including cigarettes, chewing tobacco, or electronic cigarettes.  If you are pregnant, do not drink alcohol.  If you are breastfeeding, limit how much and how often you drink alcohol.  Limit alcohol intake to no more than 1 drink per day for nonpregnant women. One drink equals 12 ounces of beer, 5 ounces of wine, or 1 ounces of hard liquor.  Do not use street drugs.  Do not share needles.  Ask your health care provider for help if you need support or information about quitting drugs.  Tell your health care provider if you often feel depressed.  Tell your health care provider if you have ever been abused or do not feel safe at home. This information is not intended to replace advice given to you by your health care provider. Make sure you discuss any questions you have with your health care provider. Document Released: 05/14/2011 Document Revised: 04/05/2016 Document Reviewed: 08/02/2015  2017 Elsevier      --------------------------------------------------------------------    Trinna Post, PA-C  [facility]

## 2016-10-20 ENCOUNTER — Other Ambulatory Visit: Payer: Self-pay | Admitting: Family Medicine

## 2016-10-20 DIAGNOSIS — E039 Hypothyroidism, unspecified: Secondary | ICD-10-CM

## 2016-11-01 ENCOUNTER — Telehealth: Payer: Self-pay

## 2016-11-01 LAB — CBC WITH DIFFERENTIAL/PLATELET
Basophils Absolute: 0 10*3/uL (ref 0.0–0.2)
Basos: 1 %
EOS (ABSOLUTE): 0.2 10*3/uL (ref 0.0–0.4)
Eos: 4 %
Hematocrit: 37 % (ref 34.0–46.6)
Hemoglobin: 12.6 g/dL (ref 11.1–15.9)
Immature Grans (Abs): 0 10*3/uL (ref 0.0–0.1)
Immature Granulocytes: 0 %
Lymphocytes Absolute: 1.9 10*3/uL (ref 0.7–3.1)
Lymphs: 44 %
MCH: 29.4 pg (ref 26.6–33.0)
MCHC: 34.1 g/dL (ref 31.5–35.7)
MCV: 86 fL (ref 79–97)
Monocytes Absolute: 0.3 10*3/uL (ref 0.1–0.9)
Monocytes: 7 %
Neutrophils Absolute: 1.9 10*3/uL (ref 1.4–7.0)
Neutrophils: 44 %
Platelets: 176 10*3/uL (ref 150–379)
RBC: 4.29 x10E6/uL (ref 3.77–5.28)
RDW: 13.7 % (ref 12.3–15.4)
WBC: 4.3 10*3/uL (ref 3.4–10.8)

## 2016-11-01 LAB — COMPREHENSIVE METABOLIC PANEL
ALT: 19 IU/L (ref 0–32)
AST: 20 IU/L (ref 0–40)
Albumin/Globulin Ratio: 2 (ref 1.2–2.2)
Albumin: 4.5 g/dL (ref 3.5–5.5)
Alkaline Phosphatase: 54 IU/L (ref 39–117)
BUN/Creatinine Ratio: 15 (ref 9–23)
BUN: 13 mg/dL (ref 6–24)
Bilirubin Total: 0.6 mg/dL (ref 0.0–1.2)
CO2: 25 mmol/L (ref 18–29)
Calcium: 9.2 mg/dL (ref 8.7–10.2)
Chloride: 101 mmol/L (ref 96–106)
Creatinine, Ser: 0.85 mg/dL (ref 0.57–1.00)
GFR calc Af Amer: 96 mL/min/{1.73_m2} (ref >59)
GFR calc non Af Amer: 84 mL/min/{1.73_m2} (ref >59)
Globulin, Total: 2.3 g/dL (ref 1.5–4.5)
Glucose: 80 mg/dL (ref 65–99)
Potassium: 4 mmol/L (ref 3.5–5.2)
Sodium: 141 mmol/L (ref 134–144)
Total Protein: 6.8 g/dL (ref 6.0–8.5)

## 2016-11-01 LAB — LIPID PANEL
Chol/HDL Ratio: 2.7 ratio units (ref 0.0–4.4)
Cholesterol, Total: 195 mg/dL (ref 100–199)
HDL: 72 mg/dL (ref >39)
LDL Calculated: 113 mg/dL — ABNORMAL HIGH (ref 0–99)
Triglycerides: 51 mg/dL (ref 0–149)
VLDL Cholesterol Cal: 10 mg/dL (ref 5–40)

## 2016-11-01 LAB — TSH: TSH: 1.89 u[IU]/mL (ref 0.450–4.500)

## 2016-11-01 NOTE — Telephone Encounter (Signed)
Patient advised as below.  

## 2016-11-01 NOTE — Telephone Encounter (Signed)
-----   Message from Trinna Post, [place] sent at 11/01/2016 10:02 AM EST ----- Worthy Keeler is all normal. Total cholesterol went down. Keep up the good work.

## 2016-11-20 ENCOUNTER — Ambulatory Visit
Admission: RE | Admit: 2016-11-20 | Discharge: 2016-11-20 | Disposition: A | Discharge status: Home / Self Care | Payer: 59 | Source: Ambulatory Visit | Attending: Physician Assistant | Admitting: Physician Assistant

## 2016-11-20 DIAGNOSIS — Z1231 Encounter for screening mammogram for malignant neoplasm of breast: Secondary | ICD-10-CM | POA: Diagnosis not present

## 2016-11-27 ENCOUNTER — Inpatient Hospital Stay
Admission: RE | Admit: 2016-11-27 | Discharge: 2016-11-27 | Disposition: A | Discharge status: Home / Self Care | Payer: Self-pay | Source: Ambulatory Visit | Attending: *Deleted | Admitting: *Deleted

## 2016-11-27 ENCOUNTER — Other Ambulatory Visit: Payer: Self-pay | Admitting: *Deleted

## 2016-11-27 DIAGNOSIS — Z9289 Personal history of other medical treatment: Secondary | ICD-10-CM

## 2017-04-05 ENCOUNTER — Other Ambulatory Visit: Payer: Self-pay | Admitting: Physician Assistant

## 2017-04-05 DIAGNOSIS — E039 Hypothyroidism, unspecified: Secondary | ICD-10-CM

## 2017-04-09 NOTE — Telephone Encounter (Signed)
LOV/CPE- 10/09/2016. Renaldo Fiddler, CMA

## 2017-05-27 ENCOUNTER — Other Ambulatory Visit: Payer: Self-pay | Admitting: Physician Assistant

## 2017-05-27 DIAGNOSIS — J302 Other seasonal allergic rhinitis: Secondary | ICD-10-CM

## 2017-07-16 ENCOUNTER — Encounter: Payer: Self-pay | Admitting: Physician Assistant

## 2017-07-16 ENCOUNTER — Ambulatory Visit (INDEPENDENT_AMBULATORY_CARE_PROVIDER_SITE_OTHER): Payer: 59 | Admitting: Physician Assistant

## 2017-07-16 VITALS — BP 100/70 | HR 68 | Temp 98.7°F | Resp 16 | Ht 64.0 in | Wt 129.0 lb

## 2017-07-16 DIAGNOSIS — M5030 Other cervical disc degeneration, unspecified cervical region: Secondary | ICD-10-CM | POA: Diagnosis not present

## 2017-07-16 DIAGNOSIS — J301 Allergic rhinitis due to pollen: Secondary | ICD-10-CM | POA: Diagnosis not present

## 2017-07-16 DIAGNOSIS — M62838 Other muscle spasm: Secondary | ICD-10-CM

## 2017-07-16 DIAGNOSIS — M542 Cervicalgia: Secondary | ICD-10-CM

## 2017-07-16 DIAGNOSIS — K644 Residual hemorrhoidal skin tags: Secondary | ICD-10-CM

## 2017-07-16 DIAGNOSIS — R42 Dizziness and giddiness: Secondary | ICD-10-CM | POA: Diagnosis not present

## 2017-07-16 MED ORDER — MELOXICAM 15 MG PO TABS: 15.0000 mg | ORAL_TABLET | Freq: Every day | ORAL | 0 refills | Status: DC

## 2017-07-16 NOTE — Patient Instructions (Signed)
Nasal Allergies Nasal allergies are a reaction to allergens in the air. Allergens are particles in the air that cause your body to have an allergic reaction. Nasal allergies are not passed from person to person (are not contagious). They cannot be cured, but they can be controlled. What are the causes? Seasonal nasal allergies (hay fever) are caused by pollen allergens that come from grasses, trees, and weeds. Year-round nasal allergies (perennial allergic rhinitis) are caused by allergens such as house dust mites, pet dander, and mold spores. What increases the risk? The following factors may make you more likely to develop this condition:  Having certain health conditions. These include: ? Other types of allergies, such as food allergies. ? Asthma. ? Eczema.  Having a close relative who has allergies or asthma.  Exposure to house dust, pollen, dander, or other allergens at home or at work.  Exposure to air pollution or secondhand smoke when you were a child.  What are the signs or symptoms? Symptoms of this condition include:  Sneezing.  Runny nose or stuffy nose (congestion).  Watery (tearing) eyes.  Itchy eyes, nose, mouth, throat, skin, or other area.  Sore throat.  Headache.  Decreased sense of smell or taste.  Fatigue. This may occur if you have trouble sleeping due to allergies.  Swollen eyelids.  How is this diagnosed? This condition is diagnosed with a medical history and physical exam. Allergy testing may be done to determine exactly what triggers your nasal allergies. How is this treated? There is no cure for nasal allergies. Treatment focuses on controlling your symptoms, and it may include:  Medicines that block allergy symptoms. These may include allergy shots, nasal sprays, and oral antihistamines.  Avoiding the allergen.  Follow these instructions at home:  Avoid the allergen that is causing your symptoms, if possible.  Keep windows closed. If  possible, use air conditioning when pollen counts are high.  Do not use fans in your home.  Do not hang clothes outside to dry.  Wear sunglasses to keep pollen out of your eyes.  Wash your hands right away after you touch household pets.  Take over-the-counter and prescription medicines only as told by your health care provider.  Keep all follow-up visits as told by your health care provider. This is important. Contact a health care provider if:  You have a fever.  You develop a cough that does not go away (is persistent).  You start to wheeze.  Your symptoms do not improve with treatment.  You have thick nasal discharge.  You start to have nosebleeds. Get help right away if:  Your tongue or your lips are swollen.  You have trouble breathing.  You feel light-headed or you feel like you are going to faint.  You have cold sweats. This information is not intended to replace advice given to you by your health care provider. Make sure you discuss any questions you have with your health care provider. Document Released: 10/29/2005 Document Revised: 04/02/2016 Document Reviewed: 05/11/2015 Elsevier Interactive Patient Education  2018 [employer]. Degenerative Disk Disease Degenerative disk disease is a condition caused by the changes that occur in spinal disks as you grow older. Spinal disks are soft and compressible disks located between the bones of your spine (vertebrae). These disks act like shock absorbers. Degenerative disk disease can affect the whole spine. However, the neck and lower back are most commonly affected. Many changes can occur in the spinal disks with aging, such as:  The spinal  disks may dry and shrink.  Small tears may occur in the tough, outer covering of the disk (annulus).  The disk space may become smaller due to loss of water.  Abnormal growths in the bone (spurs) may occur. This can put pressure on the nerve roots exiting the spinal canal,  causing pain.  The spinal canal may become narrowed.  What increases the risk?  Being overweight.  Having a family history of degenerative disk disease.  Smoking.  There is increased risk if you are doing heavy lifting or have a sudden injury. What are the signs or symptoms? Symptoms vary from person to person and may include:  Pain that varies in intensity. Some people have no pain, while others have severe pain. The location of the pain depends on the part of your backbone that is affected. ? You will have neck or arm pain if a disk in the neck area is affected. ? You will have pain in your back, buttocks, or legs if a disk in the lower back is affected.  Pain that becomes worse while bending, reaching up, or with twisting movements.  Pain that may start gradually and then get worse as time passes. It may also start after a major or minor injury.  Numbness or tingling in the arms or legs.  How is this diagnosed? Your health care provider will ask you about your symptoms and about activities or habits that may cause the pain. He or she may also ask about any injuries, diseases, or treatments you have had. Your health care provider will examine you to check for the range of movement that is possible in the affected area, to check for strength in your extremities, and to check for sensation in the areas of the arms and legs supplied by different nerve roots. You may also have:  An X-ray of the spine.  Other imaging tests, such as MRI.  How is this treated? Your health care provider will advise you on the best plan for treatment. Treatment may include:  Medicines.  Rehabilitation exercises.  Follow these instructions at home:  Follow proper lifting and walking techniques as advised by your health care provider.  Maintain good posture.  Exercise regularly as advised by your health care provider.  Perform relaxation exercises.  Change your sitting, standing, and sleeping  habits as advised by your health care provider.  Change positions frequently.  Lose weight or maintain a healthy weight as advised by your health care provider.  Do not use any tobacco products, including cigarettes, chewing tobacco, or electronic cigarettes. If you need help quitting, ask your health care provider.  Wear supportive footwear.  Take medicines only as directed by your health care provider. Contact a health care provider if:  Your pain does not go away within 1-4 weeks.  You have significant appetite or weight loss. Get help right away if:  Your pain is severe.  You notice weakness in your arms, hands, or legs.  You begin to lose control of your bladder or bowel movements.  You have fevers or night sweats. This information is not intended to replace advice given to you by your health care provider. Make sure you discuss any questions you have with your health care provider. Document Released: 08/26/2007 Document Revised: 04/05/2016 Document Reviewed: 03/02/2014 Elsevier Interactive Patient Education  [employer].

## 2017-07-16 NOTE — Progress Notes (Signed)
Patient: Brenda Price Female    DOB: 09/04/1971   46 y.o.   MRN: [id number] Visit Date: 07/16/2017  Today's Provider: Mar Daring, PA-C   Chief Complaint  Patient presents with  . Headache   Subjective:    HPI Patient here today C/O dizziness, ringing in ears, and general fatigue since Saturday. Patient reports dizziness is worse with standing and at the end of the day. Patient reports visual changes with dizziness. Patient has had cardiac work up that was negative. Never seen by ENT. Dizziness can come on at anytime. No known trigger. Sometimes associated with tunnel vision.  Patient reports she has had symptoms on and off for several years. Patient reports decreased appetite and denies vomiting, nausea or fever. Patient reports multiple symptoms for several years and reports that symptoms have been getting worse in the last few days. Patient reports sinus pain around eyes, PND, and sore throat in the mornings. Patient reports having a hemorrhoid since Thursday night. Patient reports headache and neck pain has tried taking Ibuprofen and Baclofen reports mild relief.   She has been under more stress recently. She has a son with disabilities that she has to lift and move, as well as having some other family stressors and financial stressors.   She has had chronic neck pain and has had an xray on 05/11/16 which had shown DDD at C5-C6 with bilateral foraminal encroachment. She still is not having any cervical radiculopathy, but is having worsening neck pain in this region of her cervical spine.     Allergies  Allergen Reactions  . Aspirin Other (See Comments)    Causes asthma to act up   . Erythromycin Other (See Comments)    Stomach cramping      Current Outpatient Prescriptions:  .  baclofen (LIORESAL) 10 MG tablet, Take 10 mg by mouth 3 (three) times daily as needed for muscle spasms. , Disp: , Rfl:  .  cetirizine (ZYRTEC) 10 MG tablet, Take 10 mg by mouth daily.,  Disp: , Rfl:  .  ibuprofen (MOTRIN IB) 200 MG tablet, Take 3 tablets (600 mg total) by mouth every 6 (six) hours as needed for headache, mild pain or cramping., Disp: 30 tablet, Rfl: 0 .  levocetirizine (XYZAL) 5 MG tablet, Take 1 tablet by mouth  daily, Disp: 90 tablet, Rfl: 1 .  levothyroxine (SYNTHROID, LEVOTHROID) 50 MCG tablet, TAKE 1 TABLET BY MOUTH  DAILY, Disp: 90 tablet, Rfl: 1 .  montelukast (SINGULAIR) 10 MG tablet, TAKE 1 TABLET BY MOUTH  DAILY, Disp: 90 tablet, Rfl: 1 .  omeprazole (PRILOSEC) 20 MG capsule, Take 20 mg by mouth daily., Disp: , Rfl:  .  PROAIR HFA 108 (90 BASE) MCG/ACT inhaler, Inhale 2 puffs into the lungs every 6 (six) hours as needed for wheezing or shortness of breath. , Disp: , Rfl:  .  topiramate (TOPAMAX) 25 MG tablet, Take 12.5 mg by mouth daily. , Disp: , Rfl:   Review of Systems  Constitutional: Positive for activity change, appetite change and fatigue.  HENT: Positive for postnasal drip, rhinorrhea, sinus pain, sinus pressure, sore throat and tinnitus.   Respiratory: Negative.   Cardiovascular: Negative.   Gastrointestinal: Positive for rectal pain.  Musculoskeletal: Positive for back pain.  Neurological: Positive for dizziness and headaches.  Psychiatric/Behavioral: Positive for sleep disturbance. The patient is nervous/anxious.     Social History  Substance Use Topics  . Smoking status: Never Smoker  . Smokeless  tobacco: Never Used  . Alcohol use Yes     Comment: occasional   Objective:   BP 100/70 (BP Location: Left Arm, Patient Position: Sitting, Cuff Size: Large)   Pulse 68   Temp 98.7 F (37.1 C) (Oral)   Resp 16   Ht 5\' 4"  (1.626 m)   Wt 129 lb (58.5 kg)   SpO2 98%   BMI 22.14 kg/m  Vitals:   07/16/17 0845  BP: 100/70  Pulse: 68  Resp: 16  Temp: 98.7 F (37.1 C)  TempSrc: Oral  SpO2: 98%  Weight: 129 lb (58.5 kg)  Height: 5\' 4"  (1.626 m)     Physical Exam  Constitutional: She is oriented to person, place, and time. She  appears well-developed and well-nourished. No distress.  HENT:  Head: Normocephalic and atraumatic.  Right Ear: Hearing, tympanic membrane, external ear and ear canal normal.  Left Ear: Hearing, tympanic membrane, external ear and ear canal normal.  Nose: Nose normal.  Mouth/Throat: Uvula is midline, oropharynx is clear and moist and mucous membranes are normal. No oropharyngeal exudate.  Eyes: Pupils are equal, round, and reactive to light. Conjunctivae are normal. Right eye exhibits no discharge. Left eye exhibits no discharge. No scleral icterus.  Neck: Normal range of motion. Neck supple. No JVD present. Spinous process tenderness (C6) and muscular tenderness (bilaterally) present. No tracheal deviation, no erythema and normal range of motion present. No thyromegaly present.  Cardiovascular: Normal rate, regular rhythm and normal heart sounds.  Exam reveals no gallop and no friction rub.   No murmur heard. Pulmonary/Chest: Effort normal and breath sounds normal. No stridor. No respiratory distress. She has no wheezes. She has no rales.  Genitourinary: Rectal exam shows external hemorrhoid.  Lymphadenopathy:    She has no cervical adenopathy.  Neurological: She is alert and oriented to person, place, and time. She has normal strength and normal reflexes. No cranial nerve deficit. She displays a negative Romberg sign. Coordination and gait normal.  Skin: Skin is warm and dry. She is not diaphoretic.  Vitals reviewed.      Assessment & Plan:     1. Non-seasonal allergic rhinitis due to pollen Advised patient to continue zyrtec (1/2 tab) and xyzal (1/2 tab) and singulair. Advised to add flonase and Mucinex for congestion. She is to call if no improvement.   2. Vertigo Referral placed to ENT for further evaluation and to see if BPPV vs meniere's. May use OTC meclizine (Bonine) prn. Consult appreciated.  - Ambulatory referral to ENT  3. DDD (degenerative disc disease), cervical Worsening  neck pain. Meloxicam given for pain and inflammation. Referral made to orthopedics as below for further evaluation and treatment. Consult appreciated.  - Ambulatory referral to Orthopedic Surgery - meloxicam (MOBIC) 15 MG tablet; Take 1 tablet (15 mg total) by mouth daily.  Dispense: 30 tablet; Refill: 0  4. Cervicalgia See above medical treatment plan. - Ambulatory referral to Orthopedic Surgery - meloxicam (MOBIC) 15 MG tablet; Take 1 tablet (15 mg total) by mouth daily.  Dispense: 30 tablet; Refill: 0  5. Muscle spasms of neck See above medical treatment plan. - Ambulatory referral to Orthopedic Surgery - meloxicam (MOBIC) 15 MG tablet; Take 1 tablet (15 mg total) by mouth daily.  Dispense: 30 tablet; Refill: 0  6. External hemorrhoid Epsom salt soaks, preparation H, witch hazel pads for itching prn.       Mar Daring, PA-C  [facility]

## 2017-08-12 ENCOUNTER — Other Ambulatory Visit: Payer: Self-pay | Admitting: Physician Assistant

## 2017-08-12 DIAGNOSIS — M5030 Other cervical disc degeneration, unspecified cervical region: Secondary | ICD-10-CM

## 2017-08-12 DIAGNOSIS — M542 Cervicalgia: Secondary | ICD-10-CM

## 2017-08-12 DIAGNOSIS — M62838 Other muscle spasm: Secondary | ICD-10-CM

## 2017-09-21 ENCOUNTER — Other Ambulatory Visit: Payer: Self-pay | Admitting: Physician Assistant

## 2017-09-21 DIAGNOSIS — E039 Hypothyroidism, unspecified: Secondary | ICD-10-CM

## 2017-10-11 ENCOUNTER — Ambulatory Visit (INDEPENDENT_AMBULATORY_CARE_PROVIDER_SITE_OTHER): Payer: 59 | Admitting: Physician Assistant

## 2017-10-11 ENCOUNTER — Encounter: Payer: Self-pay | Admitting: Physician Assistant

## 2017-10-11 VITALS — BP 132/74 | HR 72 | Temp 97.4°F | Resp 16 | Ht 64.0 in | Wt 133.0 lb

## 2017-10-11 DIAGNOSIS — G43909 Migraine, unspecified, not intractable, without status migrainosus: Secondary | ICD-10-CM

## 2017-10-11 DIAGNOSIS — Z1231 Encounter for screening mammogram for malignant neoplasm of breast: Secondary | ICD-10-CM | POA: Diagnosis not present

## 2017-10-11 DIAGNOSIS — E039 Hypothyroidism, unspecified: Secondary | ICD-10-CM | POA: Diagnosis not present

## 2017-10-11 DIAGNOSIS — J45909 Unspecified asthma, uncomplicated: Secondary | ICD-10-CM | POA: Diagnosis not present

## 2017-10-11 DIAGNOSIS — Z0000 Encounter for general adult medical examination without abnormal findings: Secondary | ICD-10-CM

## 2017-10-11 DIAGNOSIS — M542 Cervicalgia: Secondary | ICD-10-CM

## 2017-10-11 DIAGNOSIS — Z1239 Encounter for other screening for malignant neoplasm of breast: Secondary | ICD-10-CM

## 2017-10-11 DIAGNOSIS — E7800 Pure hypercholesterolemia, unspecified: Secondary | ICD-10-CM

## 2017-10-11 MED ORDER — BACLOFEN 10 MG PO TABS: 10.0000 mg | ORAL_TABLET | Freq: Three times a day (TID) | ORAL | 5 refills | Status: AC | PRN

## 2017-10-11 MED ORDER — ALBUTEROL SULFATE HFA 108 (90 BASE) MCG/ACT IN AERS: 2.0000 | INHALATION_SPRAY | Freq: Four times a day (QID) | RESPIRATORY_TRACT | 5 refills | Status: DC | PRN

## 2017-10-11 NOTE — Progress Notes (Signed)
Patient: Brenda Price, Female    DOB: 1971-07-04, 46 y.o.   MRN: [id number] Visit Date: 10/14/2017  Today's Provider: Trinna Post, PA-C   Chief Complaint  Patient presents with  . Annual Exam   Subjective:    Annual physical exam Brenda Price is a 46 y.o. female who presents today for health maintenance and complete physical. She feels fairly well. She reports exercising some. She reports she is sleeping well. Still works at [employer] in the [employer].   PAP/HPV: 2014, normal and negative. Mammo: 11/2016 normal Flu shot: received outside work  She continues with allergic rhinitis.  She has recently been seen by Dr. Manuella Ghazi in neurology for dizziness. She was started on butterbur. She is on Lyrica for neuropathy. Baclofen as needed for neck pain/migraines. She has an MRI upcoming.   -----------------------------------------------------------------   Review of Systems  Constitutional: Negative.   HENT: Negative.   Eyes: Negative.   Respiratory: Negative.   Cardiovascular: Negative.   Gastrointestinal: Negative.   Endocrine: Negative.   Genitourinary: Negative.   Musculoskeletal: Negative.   Skin: Negative.   Allergic/Immunologic: Negative.   Neurological: Negative.   Hematological: Negative.   Psychiatric/Behavioral: Negative.     Social History      She  reports that  has never smoked. she has never used smokeless tobacco. She reports that she drinks alcohol. She reports that she does not use drugs.       Social History   Socioeconomic History  . Marital status: Married    Spouse name: None  . Number of children: None  . Years of education: None  . Highest education level: None  Social Needs  . Financial resource strain: None  . Food insecurity - worry: None  . Food insecurity - inability: None  . Transportation needs - medical: None  . Transportation needs - non-medical: None  Occupational History  . None  Tobacco Use  . Smoking  status: Never Smoker  . Smokeless tobacco: Never Used  Substance and Sexual Activity  . Alcohol use: Yes    Comment: occasional  . Drug use: No  . Sexual activity: None  Other Topics Concern  . None  Social History Narrative  . None    Past Medical History:  Diagnosis Date  . Allergy   . Asthma   . Fibromyalgia   . Heart murmur   . IBS (irritable bowel syndrome)   . Thyroid disease      Patient Active Problem List   Diagnosis Date Noted  . Orthostatic dizziness 03/21/2016  . Dizziness 03/21/2016  . Pelvic mass in female 03/03/2016  . Intra-abdominal and pelvic swelling, mass and lump, unspecified site 03/03/2016  . 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 Family planning 09/29/2015  . Acid reflux 09/29/2015  . Cardiac murmur 09/29/2015  . Excess, menstruation 09/29/2015  . Hemorrhoid 09/29/2015  . 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 Embedded toenail 09/29/2015  . Irregular bleeding 09/29/2015  . Headache, migraine 09/29/2015  . 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 Cervical pain 09/29/2015  . Neutropenia ([place]) 09/29/2015  . Fungal infection of nail 09/29/2015  . Allergic rhinitis, seasonal 09/29/2015  . 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 Excessive and frequent menstruation 09/29/2015  . Gastro-esophageal reflux disease without esophagitis 09/29/2015  . Asthma 08/29/2015    Past Surgical History:  Procedure Laterality Date  . CESAREAN SECTION    . KNEE ARTHROSCOPY    .  LAPAROSCOPIC TOTAL HYSTERECTOMY     with BS. Dr. Norton Blizzard [facility]  . LAPAROSCOPY Right 03/03/2016   Procedure:  diagnostic laparoscopy and right oophrectomy;  Surgeon: Aletha Halim, MD;  Location: [facility] ORS;  Service: Gynecology;  Laterality: Right;    Family History        Family Status  Relation Name Status  . Father  Deceased  . Mother  Alive  . Sister  Alive  . Brother  Alive        Her family history includes Arthritis in her mother;  Heart attack in her father; Hyperlipidemia in her mother; Thyroid disease in her brother.     Allergies  Allergen Reactions  . Aspirin Other (See Comments)    Causes asthma to act up   . Erythromycin Other (See Comments)    Stomach cramping      Current Outpatient Medications:  .  albuterol (PROAIR HFA) 108 (90 Base) MCG/ACT inhaler, Inhale 2 puffs into the lungs every 6 (six) hours as needed for wheezing or shortness of breath., Disp: 6.7 g, Rfl: 5 .  baclofen (LIORESAL) 10 MG tablet, Take 1 tablet (10 mg total) by mouth 3 (three) times daily as needed for muscle spasms., Disp: 30 each, Rfl: 5 .  cetirizine (ZYRTEC) 10 MG tablet, Take 10 mg by mouth daily., Disp: , Rfl:  .  ibuprofen (MOTRIN IB) 200 MG tablet, Take 3 tablets (600 mg total) by mouth every 6 (six) hours as needed for headache, mild pain or cramping., Disp: 30 tablet, Rfl: 0 .  levocetirizine (XYZAL) 5 MG tablet, Take 1 tablet by mouth  daily, Disp: 90 tablet, Rfl: 1 .  levothyroxine (SYNTHROID, LEVOTHROID) 50 MCG tablet, TAKE 1 TABLET BY MOUTH  DAILY, Disp: 90 tablet, Rfl: 1 .  meloxicam (MOBIC) 15 MG tablet, TAKE 1 TABLET BY MOUTH EVERY DAY, Disp: 30 tablet, Rfl: 0 .  montelukast (SINGULAIR) 10 MG tablet, TAKE 1 TABLET BY MOUTH  DAILY, Disp: 90 tablet, Rfl: 1 .  omeprazole (PRILOSEC) 20 MG capsule, Take 20 mg by mouth daily., Disp: , Rfl:  .  pregabalin (LYRICA) 50 MG capsule, , Disp: , Rfl:  .  topiramate (TOPAMAX) 25 MG tablet, Take 12.5 mg by mouth daily. , Disp: , Rfl:    Patient Care Team: Mar Daring, PA-C as PCP - General (Family Medicine)      Objective:   Vitals: BP 132/74 (BP Location: Left Arm, Patient Position: Sitting, Cuff Size: Normal)   Pulse 72   Temp (!) 97.4 F (36.3 C) (Oral)   Resp 16   Ht 5\' 4"  (1.626 m)   Wt 133 lb (60.3 kg)   BMI 22.83 kg/m    Vitals:   10/11/17 1509  BP: 132/74  Pulse: 72  Resp: 16  Temp: (!) 97.4 F (36.3 C)  TempSrc: Oral  Weight: 133 lb (60.3 kg)    Height: 5\' 4"  (1.626 m)     Physical Exam  Constitutional: She is oriented to person, place, and time. She appears well-developed and well-nourished.  HENT:  Right Ear: External ear normal.  Left Ear: External ear normal.  Mouth/Throat: Oropharynx is clear and moist. No oropharyngeal exudate.  Cardiovascular: Normal rate and regular rhythm.  Pulmonary/Chest: Effort normal and breath sounds normal. Right breast exhibits no inverted nipple, no mass, no nipple discharge, no skin change and no tenderness. Left breast exhibits no inverted nipple, no mass, no nipple discharge, no skin change and no tenderness. Breasts are symmetrical.  Abdominal: Soft. Bowel sounds are normal.  Neurological: She is alert and oriented to person, place, and time.  Skin: Skin is warm and dry.  Psychiatric: She has a normal mood and affect. Her behavior is normal.     Depression Screen PHQ 2/9 Scores 10/09/2016 10/04/2015  PHQ - 2 Score 0 0      Assessment & Plan:     Routine Health Maintenance and Physical Exam  Exercise Activities and Dietary recommendations Goals    None      Immunization History  Administered Date(s) Administered  . Influenza,inj,Quad PF,6+ Mos 09/05/2015  . Td 07/07/2002  . Tdap 07/07/2012    Health Maintenance  Topic Date Due  . HIV Screening  10/23/1987  . PAP SMEAR  08/29/2018  . TETANUS/TDAP  07/07/2022  . INFLUENZA VACCINE  Completed     Discussed health benefits of physical activity, and encouraged her to engage in regular exercise appropriate for her age and condition.    1. Annual physical exam  Due for PAP next year.  2. Adult hypothyroidism  - TSH  3. Migraine without status migrainosus, not intractable, unspecified migraine type  Takes topamax, lyrica, butterbur. Due for MRI with neurology.  4. Breast cancer screening  - MM Digital Screening; Future  5. Cervical pain  - baclofen (LIORESAL) 10 MG tablet; Take 1 tablet (10 mg total) by  mouth 3 (three) times daily as needed for muscle spasms.  Dispense: 30 each; Refill: 5  6. Asthma, unspecified asthma severity, unspecified whether complicated, unspecified whether persistent  - albuterol (PROAIR HFA) 108 (90 Base) MCG/ACT inhaler; Inhale 2 puffs into the lungs every 6 (six) hours as needed for wheezing or shortness of breath.  Dispense: 6.7 g; Refill: 5  7. Hypercholesterolemia  - Lipid Profile  Return in about 1 year (around 10/11/2018) for CPE.  The entirety of the information documented in the History of Present Illness, Review of Systems and Physical Exam were personally obtained by me. Portions of this information were initially documented by Ashley Royalty, CMA and reviewed by me for thoroughness and accuracy.   --------------------------------------------------------------------    Trinna Post, PA-C  [facility]

## 2017-10-11 NOTE — Patient Instructions (Signed)

## 2017-10-15 ENCOUNTER — Other Ambulatory Visit: Payer: Self-pay | Admitting: Neurology

## 2017-10-15 DIAGNOSIS — R42 Dizziness and giddiness: Secondary | ICD-10-CM

## 2017-10-15 DIAGNOSIS — G43119 Migraine with aura, intractable, without status migrainosus: Secondary | ICD-10-CM

## 2017-10-22 ENCOUNTER — Ambulatory Visit: Payer: 59

## 2017-10-23 ENCOUNTER — Ambulatory Visit: Payer: 59

## 2017-10-23 ENCOUNTER — Ambulatory Visit
Admission: RE | Admit: 2017-10-23 | Discharge: 2017-10-23 | Disposition: A | Discharge status: Home / Self Care | Payer: 59 | Source: Ambulatory Visit | Attending: Neurology | Admitting: Neurology

## 2017-10-23 DIAGNOSIS — G43119 Migraine with aura, intractable, without status migrainosus: Secondary | ICD-10-CM | POA: Diagnosis present

## 2017-10-23 DIAGNOSIS — R42 Dizziness and giddiness: Secondary | ICD-10-CM

## 2017-10-23 MED ORDER — GADOBENATE DIMEGLUMINE 529 MG/ML IV SOLN
10.0000 mL | Freq: Once | INTRAVENOUS | Status: AC | PRN
  Administered 2017-10-23: 10 mL via INTRAVENOUS

## 2017-10-31 ENCOUNTER — Other Ambulatory Visit: Payer: Self-pay | Admitting: Neurology

## 2017-10-31 ENCOUNTER — Other Ambulatory Visit: Payer: Self-pay | Admitting: Physician Assistant

## 2017-10-31 DIAGNOSIS — J302 Other seasonal allergic rhinitis: Secondary | ICD-10-CM

## 2017-10-31 DIAGNOSIS — E236 Other disorders of pituitary gland: Secondary | ICD-10-CM

## 2017-11-01 LAB — LIPID PANEL
Chol/HDL Ratio: 3 ratio (ref 0.0–4.4)
Cholesterol, Total: 193 mg/dL (ref 100–199)
HDL: 64 mg/dL (ref >39)
LDL Calculated: 112 mg/dL — ABNORMAL HIGH (ref 0–99)
Triglycerides: 83 mg/dL (ref 0–149)
VLDL Cholesterol Cal: 17 mg/dL (ref 5–40)

## 2017-11-01 LAB — TSH: TSH: 2.58 u[IU]/mL (ref 0.450–4.500)

## 2017-11-06 ENCOUNTER — Telehealth: Payer: Self-pay

## 2017-11-06 ENCOUNTER — Ambulatory Visit: Payer: 59

## 2017-11-06 NOTE — Telephone Encounter (Signed)
Pt advised.   Thanks,   -Ferdinando Lodge  

## 2017-11-06 NOTE — Telephone Encounter (Signed)
-----   Message from Trinna Post, [place] sent at 11/06/2017  8:49 AM EST ----- TSH normal range, continue current Synthroid dose. Lipid profile slightly elevated LDL, but stable and not needing treatment.

## 2017-11-08 ENCOUNTER — Ambulatory Visit
Admission: RE | Admit: 2017-11-08 | Discharge: 2017-11-08 | Disposition: A | Discharge status: Home / Self Care | Payer: 59 | Source: Ambulatory Visit | Attending: Neurology | Admitting: Neurology

## 2017-11-08 ENCOUNTER — Encounter: Payer: Self-pay | Admitting: Physician Assistant

## 2017-11-08 DIAGNOSIS — E236 Other disorders of pituitary gland: Secondary | ICD-10-CM | POA: Diagnosis not present

## 2017-11-08 MED ORDER — GADOBENATE DIMEGLUMINE 529 MG/ML IV SOLN
15.0000 mL | Freq: Once | INTRAVENOUS | Status: AC | PRN
  Administered 2017-11-08: 6 mL via INTRAVENOUS

## 2017-11-09 ENCOUNTER — Other Ambulatory Visit: Payer: Self-pay | Admitting: Physician Assistant

## 2017-11-09 DIAGNOSIS — J302 Other seasonal allergic rhinitis: Secondary | ICD-10-CM

## 2017-11-18 ENCOUNTER — Encounter: Payer: Self-pay | Admitting: Physician Assistant

## 2017-11-18 DIAGNOSIS — G43909 Migraine, unspecified, not intractable, without status migrainosus: Secondary | ICD-10-CM

## 2017-11-21 MED ORDER — TOPIRAMATE 25 MG PO TABS: 12.5000 mg | ORAL_TABLET | Freq: Every day | ORAL | 1 refills | Status: DC

## 2017-12-09 ENCOUNTER — Encounter: Payer: Self-pay | Admitting: Physician Assistant

## 2017-12-09 DIAGNOSIS — G43909 Migraine, unspecified, not intractable, without status migrainosus: Secondary | ICD-10-CM

## 2017-12-10 ENCOUNTER — Ambulatory Visit
Admission: RE | Admit: 2017-12-10 | Discharge: 2017-12-10 | Disposition: A | Discharge status: Home / Self Care | Payer: Managed Care, Other (non HMO) | Source: Ambulatory Visit | Attending: Physician Assistant | Admitting: Physician Assistant

## 2017-12-10 DIAGNOSIS — Z1231 Encounter for screening mammogram for malignant neoplasm of breast: Secondary | ICD-10-CM | POA: Insufficient documentation

## 2017-12-10 DIAGNOSIS — Z1239 Encounter for other screening for malignant neoplasm of breast: Secondary | ICD-10-CM

## 2017-12-10 MED ORDER — TOPIRAMATE 25 MG PO TABS: 12.5000 mg | ORAL_TABLET | Freq: Every day | ORAL | 1 refills | Status: DC

## 2017-12-16 ENCOUNTER — Telehealth: Payer: Self-pay

## 2017-12-16 DIAGNOSIS — Z20828 Contact with and (suspected) exposure to other viral communicable diseases: Secondary | ICD-10-CM

## 2017-12-16 MED ORDER — OSELTAMIVIR PHOSPHATE 75 MG PO CAPS: 75.0000 mg | ORAL_CAPSULE | Freq: Every day | ORAL | 0 refills | Status: DC

## 2017-12-16 NOTE — Telephone Encounter (Signed)
Will send in but can be expensive. Will take one capsule daily x 10 days. May cause upset stomach as most common side effect.

## 2017-12-16 NOTE — Telephone Encounter (Signed)
Patient is requesting a prophylactic RX for Tamiflu be sent to [facility]. Patient states her daughter was diagnosed with influenza. CB# [phone]

## 2017-12-16 NOTE — Telephone Encounter (Signed)
Please review. Thanks!  

## 2017-12-16 NOTE — Telephone Encounter (Signed)
LM and advised as directed below.  Thanks,  -Nicolaas Savo

## 2018-01-08 ENCOUNTER — Ambulatory Visit: Payer: Managed Care, Other (non HMO) | Admitting: Physician Assistant

## 2018-01-08 ENCOUNTER — Encounter: Payer: Self-pay | Admitting: Physician Assistant

## 2018-01-08 VITALS — BP 114/74 | HR 61 | Temp 98.3°F | Resp 16 | Wt 132.0 lb

## 2018-01-08 DIAGNOSIS — J029 Acute pharyngitis, unspecified: Secondary | ICD-10-CM | POA: Diagnosis not present

## 2018-01-08 DIAGNOSIS — B9789 Other viral agents as the cause of diseases classified elsewhere: Secondary | ICD-10-CM

## 2018-01-08 DIAGNOSIS — R21 Rash and other nonspecific skin eruption: Secondary | ICD-10-CM | POA: Diagnosis not present

## 2018-01-08 DIAGNOSIS — J069 Acute upper respiratory infection, unspecified: Secondary | ICD-10-CM | POA: Diagnosis not present

## 2018-01-08 LAB — POCT INFLUENZA A/B
Influenza A, POC: NEGATIVE
Influenza B, POC: NEGATIVE

## 2018-01-08 LAB — POCT RAPID STREP A (OFFICE): Rapid Strep A Screen: NEGATIVE

## 2018-01-08 NOTE — Progress Notes (Signed)
Patient: Brenda Price Female    DOB: August 03, 1971   47 y.o.   MRN: [id number] Visit Date: 01/08/2018  Today's Provider: Trinna Post, PA-C   Chief Complaint  Patient presents with  . URI    Started about two days ago.  . Rash    Noticed this morning   Subjective:   She has been vaccinated. Not knowingly exposed to anybody with MMR. Her daughter had the flu earlier this month and she took Tamiflu as prophylaxis.   URI   This is a new problem. The current episode started in the past 7 days. The problem has been gradually worsening. There has been no fever. Associated symptoms include congestion, coughing, headaches, nausea, a rash, rhinorrhea, sinus pain, sneezing, a sore throat and swollen glands. Pertinent negatives include no abdominal pain, diarrhea, ear pain, plugged ear sensation, vomiting or wheezing. She has tried decongestant for the symptoms. The treatment provided moderate relief.  Rash  This is a new problem. The current episode started today. The affected locations include the chest. She was exposed to nothing. Associated symptoms include congestion, coughing, fatigue, rhinorrhea and a sore throat. Pertinent negatives include no diarrhea, fever, shortness of breath or vomiting.   Rash does not itch, no new products or medicines.     Allergies  Allergen Reactions  . Aspirin Other (See Comments)    Causes asthma to act up   . Erythromycin Other (See Comments)    Stomach cramping      Current Outpatient Medications:  .  albuterol (PROAIR HFA) 108 (90 Base) MCG/ACT inhaler, Inhale 2 puffs into the lungs every 6 (six) hours as needed for wheezing or shortness of breath., Disp: 6.7 g, Rfl: 5 .  baclofen (LIORESAL) 10 MG tablet, Take 1 tablet (10 mg total) by mouth 3 (three) times daily as needed for muscle spasms., Disp: 30 each, Rfl: 5 .  cetirizine (ZYRTEC) 10 MG tablet, Take 10 mg by mouth daily., Disp: , Rfl:  .  ibuprofen (MOTRIN IB) 200 MG tablet, Take 3  tablets (600 mg total) by mouth every 6 (six) hours as needed for headache, mild pain or cramping., Disp: 30 tablet, Rfl: 0 .  levocetirizine (XYZAL) 5 MG tablet, TAKE 1 TABLET BY MOUTH  DAILY, Disp: 90 tablet, Rfl: 1 .  levothyroxine (SYNTHROID, LEVOTHROID) 50 MCG tablet, TAKE 1 TABLET BY MOUTH  DAILY, Disp: 90 tablet, Rfl: 1 .  meloxicam (MOBIC) 15 MG tablet, TAKE 1 TABLET BY MOUTH EVERY DAY, Disp: 30 tablet, Rfl: 0 .  montelukast (SINGULAIR) 10 MG tablet, TAKE 1 TABLET BY MOUTH  DAILY, Disp: 90 tablet, Rfl: 1 .  omeprazole (PRILOSEC) 20 MG capsule, Take 20 mg by mouth daily., Disp: , Rfl:  .  pregabalin (LYRICA) 50 MG capsule, , Disp: , Rfl:  .  topiramate (TOPAMAX) 25 MG tablet, Take 0.5 tablets (12.5 mg total) by mouth daily., Disp: 45 tablet, Rfl: 1  Review of Systems  Constitutional: Positive for chills and fatigue. Negative for activity change, appetite change, diaphoresis, fever and unexpected weight change.  HENT: Positive for congestion, postnasal drip, rhinorrhea, sinus pressure, sinus pain, sneezing and sore throat. Negative for ear discharge, ear pain, nosebleeds, tinnitus, trouble swallowing and voice change.   Eyes: Positive for discharge and itching.  Respiratory: Positive for cough and chest tightness. Negative for apnea, choking, shortness of breath, wheezing and stridor.   Gastrointestinal: Positive for nausea. Negative for abdominal distention, abdominal pain, anal bleeding, blood  in stool, constipation, diarrhea, rectal pain and vomiting.  Skin: Positive for rash.  Neurological: Positive for headaches. Negative for dizziness and light-headedness.    Social History   Tobacco Use  . Smoking status: Never Smoker  . Smokeless tobacco: Never Used  Substance Use Topics  . Alcohol use: Yes    Comment: occasional   Objective:   BP 114/74 (BP Location: Right Arm, Patient Position: Sitting, Cuff Size: Normal)   Pulse 61   Temp 98.3 F (36.8 C) (Oral)   Resp 16   Wt 132  lb (59.9 kg)   SpO2 99%   BMI 22.66 kg/m  Vitals:   01/08/18 1138  BP: 114/74  Pulse: 61  Resp: 16  Temp: 98.3 F (36.8 C)  TempSrc: Oral  SpO2: 99%  Weight: 132 lb (59.9 kg)     Physical Exam  Constitutional: She is oriented to person, place, and time. She appears well-developed and well-nourished.  HENT:  Right Ear: External ear normal.  Left Ear: External ear normal.  Mouth/Throat: Mucous membranes are normal. Posterior oropharyngeal erythema present. No oropharyngeal exudate or posterior oropharyngeal edema.  Neck: Neck supple.  Cardiovascular: Normal rate and regular rhythm.  Pulmonary/Chest: Effort normal and breath sounds normal. No respiratory distress. She has no wheezes.  Lymphadenopathy:    She has no cervical adenopathy.  Neurological: She is alert and oriented to person, place, and time.  Skin: Skin is warm and dry. Rash noted.     Maculopapular blanching rash on her lateral aspect of her torso, sparing the middle of her torso. Rash is blanchable and nonpetechial.   Psychiatric: She has a normal mood and affect. Her behavior is normal.        Assessment & Plan:     1. Viral URI with cough  Rapid strep and flu are negative. Will culture due to rash to check for strep. She has been vaccinated and no known exposure to MMR. Possibly other viral exanthem. It does not itch. Continue to observe. May use inhaler PRN for wheezing or SOB.  - Culture, Group A Strep  2. Rash   3. Sore throat  - POCT Influenza A/B - POCT rapid strep A  No Follow-up on file.  The entirety of the information documented in the History of Present Illness, Review of Systems and Physical Exam were personally obtained by me. Portions of this information were initially documented by Ashley Royalty, CMA and reviewed by me for thoroughness and accuracy.        Trinna Post, PA-C  [facility]

## 2018-01-08 NOTE — Patient Instructions (Signed)

## 2018-01-10 ENCOUNTER — Telehealth: Payer: Self-pay

## 2018-01-10 LAB — CULTURE, GROUP A STREP: Strep A Culture: NEGATIVE

## 2018-01-10 NOTE — Telephone Encounter (Signed)
Pt advised.  She reports she feels about the same.  I advised her to call if her symptoms worsening to does not improve.    Thanks,   -Mickel Baas

## 2018-01-10 NOTE — Telephone Encounter (Signed)
-----   Message from Trinna Post, [place] sent at 01/10/2018  2:26 PM EST ----- Her strep culture is negative, how is she feeling?

## 2018-02-03 ENCOUNTER — Other Ambulatory Visit: Payer: Self-pay | Admitting: Physician Assistant

## 2018-02-03 DIAGNOSIS — E039 Hypothyroidism, unspecified: Secondary | ICD-10-CM

## 2018-03-14 ENCOUNTER — Ambulatory Visit: Payer: Managed Care, Other (non HMO) | Admitting: Physician Assistant

## 2018-03-14 ENCOUNTER — Encounter: Payer: Self-pay | Admitting: Physician Assistant

## 2018-03-14 VITALS — BP 124/72 | HR 76 | Temp 98.5°F | Resp 16 | Wt 131.0 lb

## 2018-03-14 DIAGNOSIS — R238 Other skin changes: Secondary | ICD-10-CM

## 2018-03-14 DIAGNOSIS — R5383 Other fatigue: Secondary | ICD-10-CM | POA: Diagnosis not present

## 2018-03-14 NOTE — Patient Instructions (Signed)

## 2018-03-14 NOTE — Progress Notes (Signed)
Patient: Brenda Price Female    DOB: Oct 12, 1971   47 y.o.   MRN: [id number] Visit Date: 03/14/2018  Today's Provider: Trinna Post, PA-C   Chief Complaint  Patient presents with  . Rash    Left shoulder; Started yesterday   Subjective:    Brenda Price is a 47 y/o woman presenting today for rash and fatigue. Rash  This is a new problem. The current episode started yesterday. The problem is unchanged. The affected locations include the left shoulder. The rash is characterized by pain, itchiness and burning. She was exposed to nothing. Associated symptoms include fatigue. Pertinent negatives include no fever.   Also reports fatigue x 2 weeks. Does have hypothyroidism on synthroid, last TSH normal 10/2017. Does have allergies. No night sweats, fevers, weight loss.      Allergies  Allergen Reactions  . Aspirin Other (See Comments)    Causes asthma to act up   . Erythromycin Other (See Comments)    Stomach cramping      Current Outpatient Medications:  .  albuterol (PROAIR HFA) 108 (90 Base) MCG/ACT inhaler, Inhale 2 puffs into the lungs every 6 (six) hours as needed for wheezing or shortness of breath., Disp: 6.7 g, Rfl: 5 .  baclofen (LIORESAL) 10 MG tablet, Take 1 tablet (10 mg total) by mouth 3 (three) times daily as needed for muscle spasms., Disp: 30 each, Rfl: 5 .  cetirizine (ZYRTEC) 10 MG tablet, Take 10 mg by mouth daily., Disp: , Rfl:  .  ibuprofen (MOTRIN IB) 200 MG tablet, Take 3 tablets (600 mg total) by mouth every 6 (six) hours as needed for headache, mild pain or cramping., Disp: 30 tablet, Rfl: 0 .  levocetirizine (XYZAL) 5 MG tablet, TAKE 1 TABLET BY MOUTH  DAILY, Disp: 90 tablet, Rfl: 1 .  levothyroxine (SYNTHROID, LEVOTHROID) 50 MCG tablet, TAKE 1 TABLET BY MOUTH  DAILY, Disp: 90 tablet, Rfl: 1 .  montelukast (SINGULAIR) 10 MG tablet, TAKE 1 TABLET BY MOUTH  DAILY, Disp: 90 tablet, Rfl: 1 .  omeprazole (PRILOSEC) 20 MG capsule, Take 20 mg by mouth  daily., Disp: , Rfl:  .  pregabalin (LYRICA) 50 MG capsule, , Disp: , Rfl:  .  meloxicam (MOBIC) 15 MG tablet, TAKE 1 TABLET BY MOUTH EVERY DAY (Patient not taking: Reported on 03/14/2018), Disp: 30 tablet, Rfl: 0 .  topiramate (TOPAMAX) 25 MG tablet, Take 0.5 tablets (12.5 mg total) by mouth daily. (Patient not taking: Reported on 03/14/2018), Disp: 45 tablet, Rfl: 1  Review of Systems  Constitutional: Positive for fatigue. Negative for activity change, appetite change, chills, diaphoresis, fever and unexpected weight change.  Skin: Positive for rash. Negative for color change, pallor and wound.    Social History   Tobacco Use  . Smoking status: Never Smoker  . Smokeless tobacco: Never Used  Substance Use Topics  . Alcohol use: Yes    Comment: occasional   Objective:   BP 124/72 (BP Location: Right Arm, Patient Position: Sitting, Cuff Size: Normal)   Pulse 76   Temp 98.5 F (36.9 C) (Oral)   Resp 16   Wt 131 lb (59.4 kg)   BMI 22.49 kg/m  Vitals:   03/14/18 1446  BP: 124/72  Pulse: 76  Resp: 16  Temp: 98.5 F (36.9 C)  TempSrc: Oral  Weight: 131 lb (59.4 kg)     Physical Exam  Constitutional: She is oriented to person, place, and time. She  appears well-developed and well-nourished.  Cardiovascular: Normal rate and regular rhythm.  Pulmonary/Chest: Effort normal and breath sounds normal.  Abdominal: Soft. Bowel sounds are normal.  Neurological: She is alert and oriented to person, place, and time.  Skin: Skin is warm and dry.  No rash noted.   Psychiatric: She has a normal mood and affect. Her behavior is normal.        Assessment & Plan:     1. Other fatigue  - TSH - Comprehensive Metabolic Panel (CMET) - CBC with Differential  2. Skin Irritation  No rash noted.  Return if symptoms worsen or fail to improve.  The entirety of the information documented in the History of Present Illness, Review of Systems and Physical Exam were personally obtained by me.  Portions of this information were initially documented by Ashley Royalty, CMA and reviewed by me for thoroughness and accuracy.        Trinna Post, PA-C  [facility]

## 2018-03-15 LAB — COMPREHENSIVE METABOLIC PANEL
ALT: 17 IU/L (ref 0–32)
AST: 19 IU/L (ref 0–40)
Albumin/Globulin Ratio: 2.1 (ref 1.2–2.2)
Albumin: 4.7 g/dL (ref 3.5–5.5)
Alkaline Phosphatase: 57 IU/L (ref 39–117)
BUN/Creatinine Ratio: 14 (ref 9–23)
BUN: 11 mg/dL (ref 6–24)
Bilirubin Total: 0.5 mg/dL (ref 0.0–1.2)
CO2: 26 mmol/L (ref 20–29)
Calcium: 9.3 mg/dL (ref 8.7–10.2)
Chloride: 100 mmol/L (ref 96–106)
Creatinine, Ser: 0.81 mg/dL (ref 0.57–1.00)
GFR calc Af Amer: 101 mL/min/{1.73_m2} (ref >59)
GFR calc non Af Amer: 88 mL/min/{1.73_m2} (ref >59)
Globulin, Total: 2.2 g/dL (ref 1.5–4.5)
Glucose: 92 mg/dL (ref 65–99)
Potassium: 3.8 mmol/L (ref 3.5–5.2)
Sodium: 141 mmol/L (ref 134–144)
Total Protein: 6.9 g/dL (ref 6.0–8.5)

## 2018-03-15 LAB — CBC WITH DIFFERENTIAL/PLATELET
Basophils Absolute: 0 10*3/uL (ref 0.0–0.2)
Basos: 0 %
EOS (ABSOLUTE): 0.1 10*3/uL (ref 0.0–0.4)
Eos: 1 %
Hematocrit: 39.3 % (ref 34.0–46.6)
Hemoglobin: 13 g/dL (ref 11.1–15.9)
Immature Grans (Abs): 0 10*3/uL (ref 0.0–0.1)
Immature Granulocytes: 0 %
Lymphocytes Absolute: 1.3 10*3/uL (ref 0.7–3.1)
Lymphs: 24 %
MCH: 29.5 pg (ref 26.6–33.0)
MCHC: 33.1 g/dL (ref 31.5–35.7)
MCV: 89 fL (ref 79–97)
Monocytes Absolute: 0.3 10*3/uL (ref 0.1–0.9)
Monocytes: 5 %
Neutrophils Absolute: 3.8 10*3/uL (ref 1.4–7.0)
Neutrophils: 70 %
Platelets: 181 10*3/uL (ref 150–379)
RBC: 4.4 x10E6/uL (ref 3.77–5.28)
RDW: 14.2 % (ref 12.3–15.4)
WBC: 5.5 10*3/uL (ref 3.4–10.8)

## 2018-03-15 LAB — TSH: TSH: 1.81 u[IU]/mL (ref 0.450–4.500)

## 2018-03-18 ENCOUNTER — Telehealth: Payer: Self-pay

## 2018-03-18 NOTE — Telephone Encounter (Signed)
-----   Message from Trinna Post, [place] sent at 03/18/2018  8:16 AM EDT ----- Thyroid, blood counts and metabolic panel all normal.

## 2018-03-18 NOTE — Telephone Encounter (Signed)
Pt advised.   Thanks,   -Laura  

## 2018-04-16 ENCOUNTER — Other Ambulatory Visit: Payer: Self-pay | Admitting: Physician Assistant

## 2018-04-16 DIAGNOSIS — J302 Other seasonal allergic rhinitis: Secondary | ICD-10-CM

## 2018-07-30 ENCOUNTER — Other Ambulatory Visit: Payer: Self-pay | Admitting: Physician Assistant

## 2018-07-30 DIAGNOSIS — E039 Hypothyroidism, unspecified: Secondary | ICD-10-CM

## 2018-08-20 IMAGING — MR MR HEAD WO/W CM
18 series · 42 of 48 positions shown · IV contrast (6 ml Multihance)
Comparison: Brain MRI 10/23/2017.

CLINICAL DATA: 46-year-old female with frequent headaches.
Dizziness, numbness and tingling in the upper extremities. Neck and
shoulder pain. Small 4 mm left pituitary lesion identified on
routine brain MRI earlier this month. Subsequent encounter.

EXAM:
MRI HEAD WITHOUT AND WITH CONTRAST
TECHNIQUE: Multiplanar, multiecho pulse sequences of the brain and surrounding
structures were obtained without and with intravenous contrast.
CONTRAST:  6mL MULTIHANCE GADOBENATE DIMEGLUMINE 529 MG/ML IV SOLN

[Series 2: T1 · sagittal · 5.0mm · 0.45mm/px · 3 of 27 slices shown (1 of 6)]
[im 1/27]
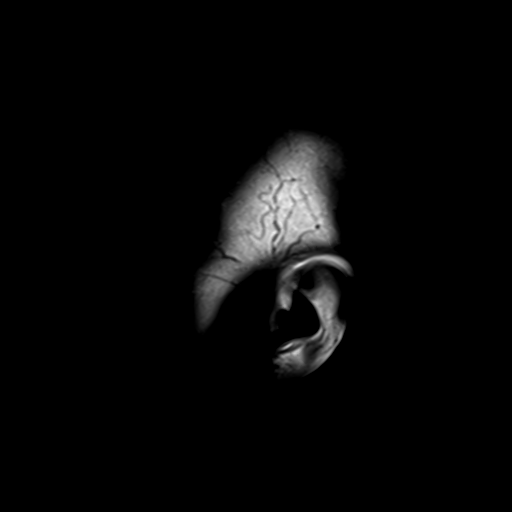
[im 14/27]
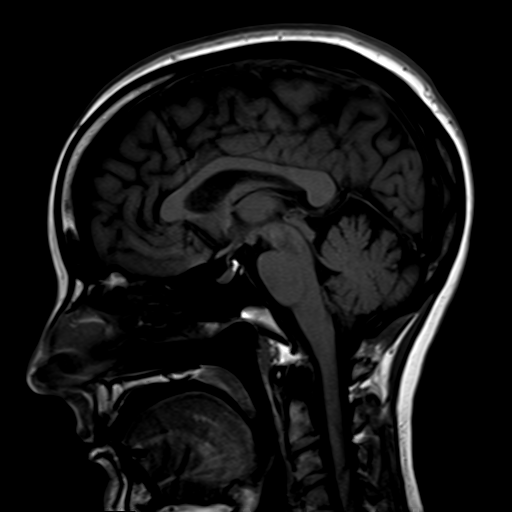
[im 27/27]
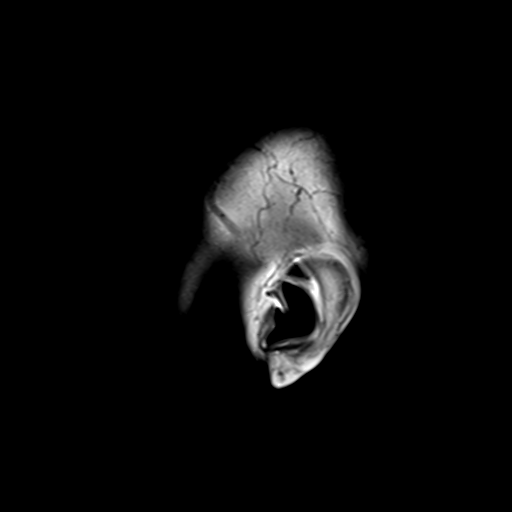

[Series 4: DWI · axial · 3.0mm · 1.80mm/px · z∈[-84,+73]mm · 4 of 42 slices shown]
[im 1/42]
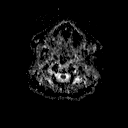
[im 14/42]
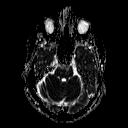
[im 28/42]
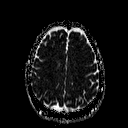
[im 42/42]
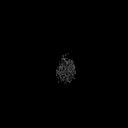

[Series 5: T2 · axial · 5.0mm · 0.60mm/px · z∈[-77,+64]mm · 2 of 23 slices shown (1 of 2)]
[im 1/23]
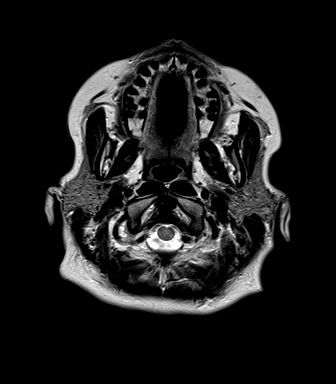
[im 23/23]
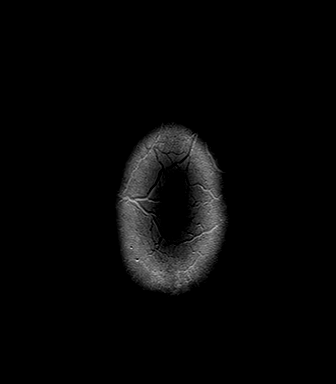

[Series 6: FLAIR · axial · 3.0mm · 0.45mm/px · z∈[-83,+70]mm · 5 of 53 slices shown]
[im 1/53]
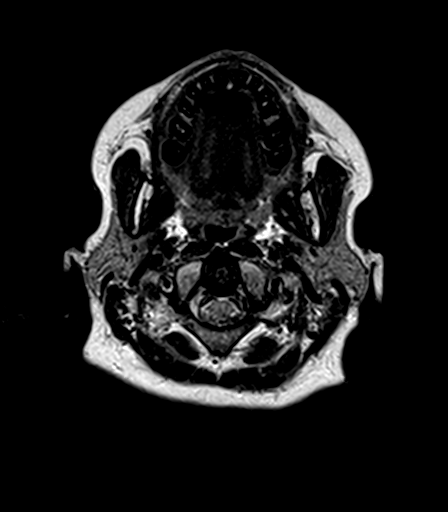
[im 14/53]
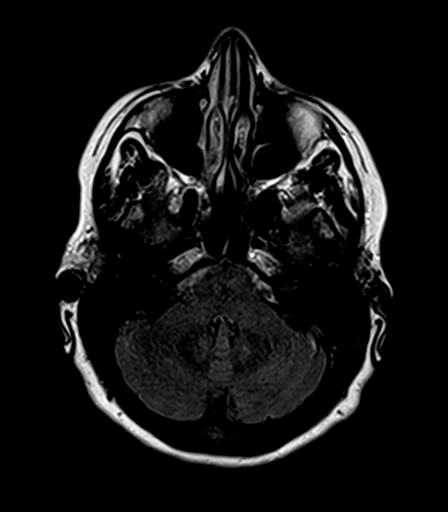
[im 27/53]
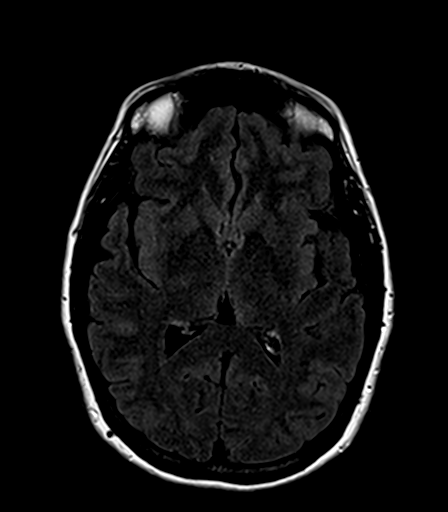
[im 40/53]
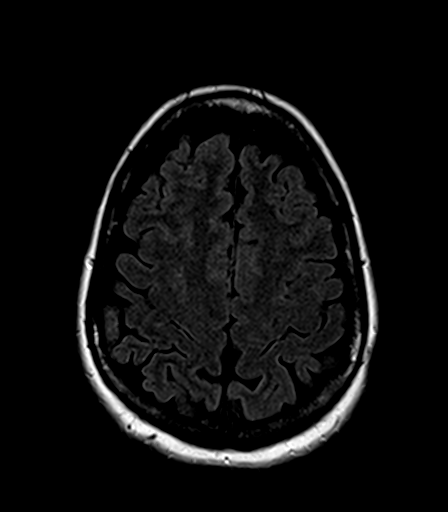
[im 53/53]
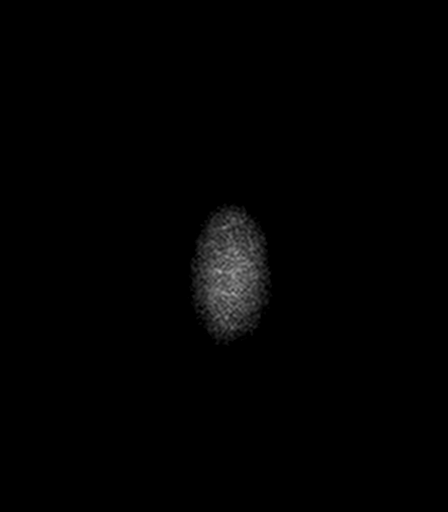

[Series 7: T2 · axial · 5.0mm · 0.45mm/px · z∈[-83,+70]mm · 2 of 25 slices shown (2 of 2)]
[im 1/25]
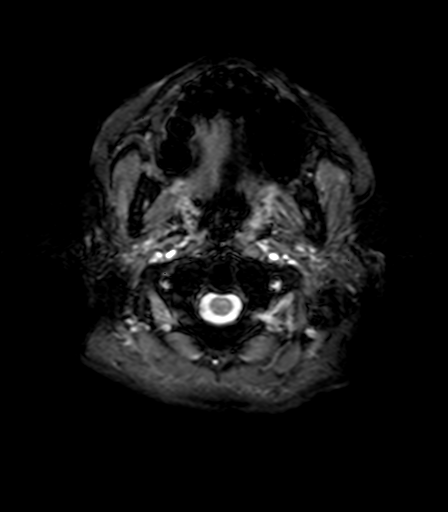
[im 25/25]
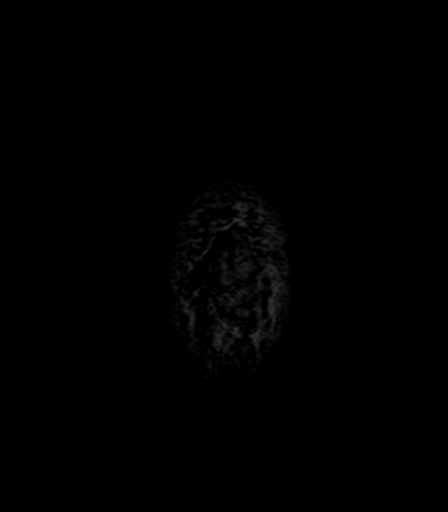

[Series 8: T1 · coronal · 3.0mm · 0.42mm/px · 1 of 15 slices shown (2 of 6)]
[im 1/15]
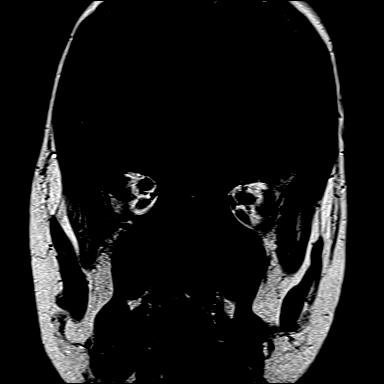

[Series 9: T1 · sagittal · 3.0mm · 0.42mm/px · 1 of 15 slices shown (3 of 6)]
[im 1/15]
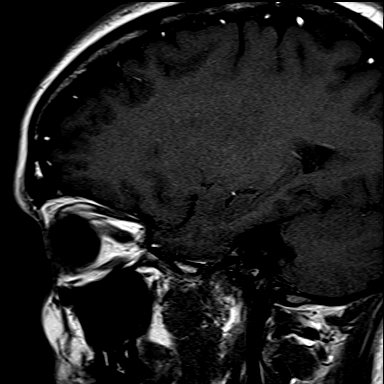

[Series 10: t1_tse_cor_dynamic pit · coronal · 3.0mm · 0.62mm/px · 1 of 9 slices shown]
[im 1/9]
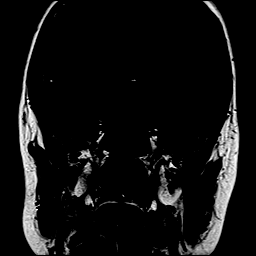

[Series 11: ti cor dynamic · coronal · 3.0mm · 0.62mm/px · 1 of 9 slices shown (1 of 5)]
[im 1/9]
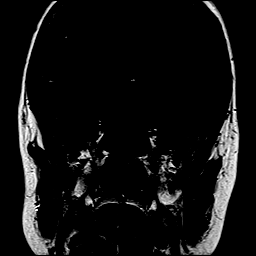

[Series 12: ti cor dynamic · coronal · 3.0mm · 0.62mm/px · 1 of 9 slices shown (2 of 5)]
[im 1/9]
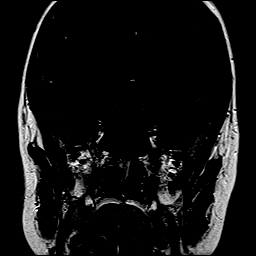

[Series 13: ti cor dynamic · coronal · 3.0mm · 0.62mm/px · 1 of 9 slices shown (3 of 5)]
[im 1/9]
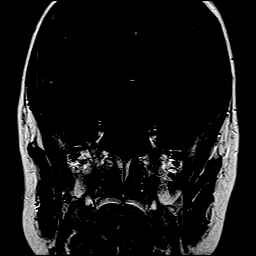

[Series 14: ti cor dynamic · coronal · 3.0mm · 0.62mm/px · 1 of 9 slices shown (4 of 5)]
[im 1/9]
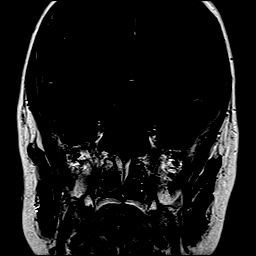

[Series 15: ti cor dynamic · coronal · 3.0mm · 0.62mm/px · 1 of 9 slices shown (5 of 5)]
[im 1/9]
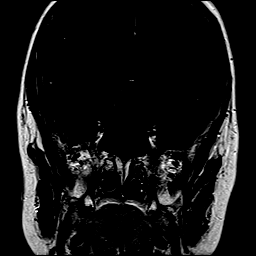

[Series 16: T1 · sagittal · 3.0mm · 0.42mm/px · 1 of 15 slices shown (4 of 6)]
[im 1/15]
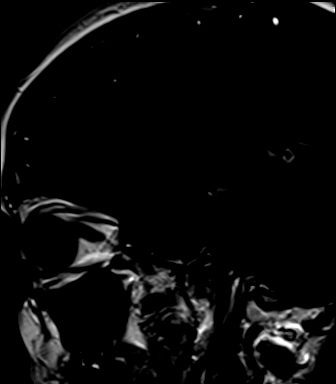

[Series 17: T1 · coronal · 3.0mm · 0.83mm/px · 1 of 15 slices shown (5 of 6)]
[im 1/15]
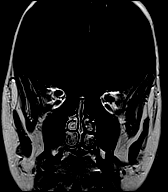

[Series 18: T1 · axial · 1.0mm · 1.00mm/px · z∈[-95,+77]mm · 10 of 176 slices shown (6 of 6)]
[im 1/176]
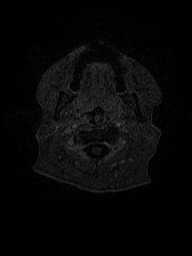
[im 12/176]
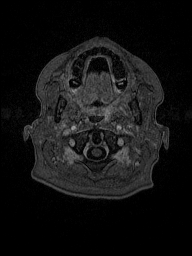
[im 24/176]
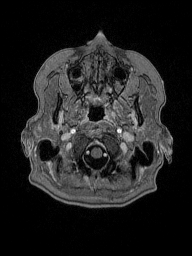
[im 36/176]
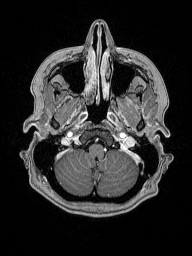
[im 59/176]
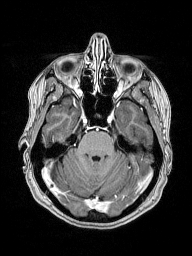
[im 82/176]
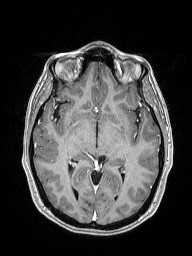
[im 106/176]
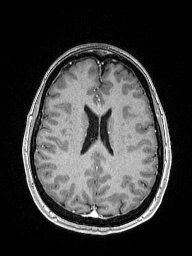
[im 129/176]
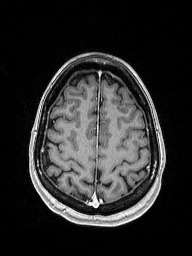
[im 152/176]
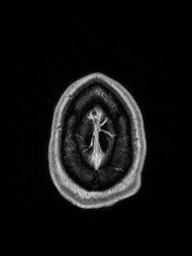
[im 176/176]
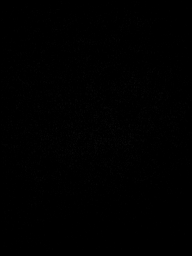

[Series 19: T1 post-contrast · coronal · 5.0mm · 0.43mm/px · 2 of 27 slices shown]
[im 1/27]
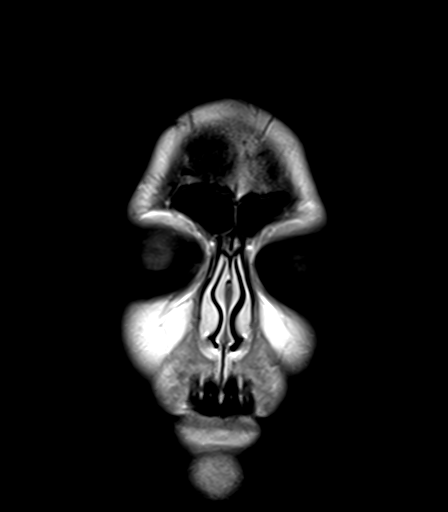
[im 27/27]
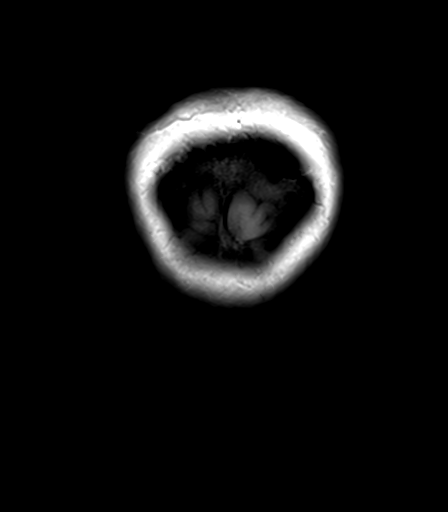

[Series 100: ax (id) · axial · 3.0mm · 1.80mm/px · z∈[-84,+73]mm · 4 of 42 slices shown]
[im 1/42]
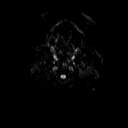
[im 14/42]
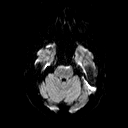
[im 28/42]
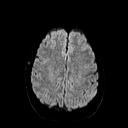
[im 42/42]
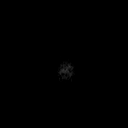

[42 of 48 positions shown; findings below may reference images not displayed]

FINDINGS: Brain: Stable cerebral volume, within normal limits. No restricted
diffusion to suggest acute infarction. No midline shift, mass
effect, evidence of mass lesion, ventriculomegaly, extra-axial
collection or acute intracranial hemorrhage. Cervicomedullary
junction within normal limits.

Gray and white matter signal is stable and normal throughout the
brain. No abnormal gray or white matter enhancement. No dural
thickening.

Vascular: Major intracranial vascular flow voids are stable.

Skull and upper cervical spine: Negative visualized cervical spine.
Visualized bone marrow signal is within normal limits.

Sinuses/Orbits: Stable and negative. Visible internal auditory
structures appear normal. Scalp soft tissues appear negative.

Other: Dedicated pre and postcontrast pituitary imaging. An oval T2
hyperintense lesion along the left aspect of the gland measuring up
to 5 mm is more better demonstrated on the axial T2 images today
(series 5, image 8). This is T1 hypointense prior to contrast
(series 9, image 9), but does appear to partially enhance (series
16, image 9 - and dynamic postcontrast images series 11, image 5
versus series 15, image 5). The overall pituitary size remains
normal. The gland homogeneously enhances elsewhere. Normal
infundibulum. No suprasellar mass or mass effect. Normal
hypothalamus. Normal cavernous sinus.
IMPRESSION: 1. Subcentimeter left pituitary lesion is confirmed, measures up to
5 mm in size, and appears to be a mixed solid/cystic composition.
No further imaging evaluation or follow-up is necessary, but
correlate with history of pituitary hypersecretion and consider
endocrine function testing.
This follows ACR consensus guidelines: Management of Incidental
Pituitary Findings on CT, MRI and F18-FDG PET: A White Paper of the
ACR Incidental Findings Committee. [HOSPITAL] 8935; 15:
2. Stable and otherwise normal MRI appearance of the brain.

## 2018-08-21 ENCOUNTER — Ambulatory Visit: Payer: Managed Care, Other (non HMO) | Admitting: Family Medicine

## 2018-08-21 ENCOUNTER — Encounter: Payer: Self-pay | Admitting: Family Medicine

## 2018-08-21 VITALS — BP 120/82 | HR 64 | Temp 98.6°F | Resp 14 | Ht 64.0 in | Wt 131.5 lb

## 2018-08-21 DIAGNOSIS — M5481 Occipital neuralgia: Secondary | ICD-10-CM

## 2018-08-21 DIAGNOSIS — B351 Tinea unguium: Secondary | ICD-10-CM

## 2018-08-21 DIAGNOSIS — K599 Functional intestinal disorder, unspecified: Secondary | ICD-10-CM

## 2018-08-21 DIAGNOSIS — Z8639 Personal history of other endocrine, nutritional and metabolic disease: Secondary | ICD-10-CM

## 2018-08-21 DIAGNOSIS — Z23 Encounter for immunization: Secondary | ICD-10-CM | POA: Diagnosis not present

## 2018-08-21 DIAGNOSIS — Z1159 Encounter for screening for other viral diseases: Secondary | ICD-10-CM

## 2018-08-21 DIAGNOSIS — J45909 Unspecified asthma, uncomplicated: Secondary | ICD-10-CM

## 2018-08-21 DIAGNOSIS — E039 Hypothyroidism, unspecified: Secondary | ICD-10-CM

## 2018-08-21 DIAGNOSIS — Z5320 Procedure and treatment not carried out because of patient's decision for unspecified reasons: Secondary | ICD-10-CM

## 2018-08-21 DIAGNOSIS — R7989 Other specified abnormal findings of blood chemistry: Secondary | ICD-10-CM

## 2018-08-21 DIAGNOSIS — K219 Gastro-esophageal reflux disease without esophagitis: Secondary | ICD-10-CM

## 2018-08-21 DIAGNOSIS — E236 Other disorders of pituitary gland: Secondary | ICD-10-CM | POA: Insufficient documentation

## 2018-08-21 DIAGNOSIS — E789 Disorder of lipoprotein metabolism, unspecified: Secondary | ICD-10-CM

## 2018-08-21 DIAGNOSIS — Z7689 Persons encountering health services in other specified circumstances: Secondary | ICD-10-CM

## 2018-08-21 DIAGNOSIS — G43909 Migraine, unspecified, not intractable, without status migrainosus: Secondary | ICD-10-CM | POA: Diagnosis not present

## 2018-08-21 DIAGNOSIS — J302 Other seasonal allergic rhinitis: Secondary | ICD-10-CM

## 2018-08-21 DIAGNOSIS — D709 Neutropenia, unspecified: Secondary | ICD-10-CM

## 2018-08-21 DIAGNOSIS — R011 Cardiac murmur, unspecified: Secondary | ICD-10-CM

## 2018-08-21 MED ORDER — LEVOCETIRIZINE DIHYDROCHLORIDE 5 MG PO TABS
2.5000 mg | ORAL_TABLET | Freq: Every evening | ORAL | 1 refills | Status: DC
Start: 2018-08-21 — End: 2019-02-01

## 2018-08-21 MED ORDER — ALBUTEROL SULFATE HFA 108 (90 BASE) MCG/ACT IN AERS: 2.0000 | INHALATION_SPRAY | Freq: Four times a day (QID) | RESPIRATORY_TRACT | 5 refills | Status: DC | PRN

## 2018-08-21 NOTE — Patient Instructions (Signed)

## 2018-08-21 NOTE — Progress Notes (Signed)
Name: Brenda Price   MRN: [id number]    DOB: [birth year]   Date:08/21/2018       Progress Note  Subjective  Chief Complaint  Chief Complaint  Patient presents with  . Establish Care  . Nail Problem    toe nail fungus    HPI  Pt presents to establish care and for the following concerns:  Migraines & Occipital Neuralgia: She is seeing Dr. Manuella Ghazi with neurology - taking Emgality and this is working well.  Has had trigger point injections and a nerve block in her neck in the past and this seemed to help her pain.  Abortive therapy is Baclofen and Ibuprofen (she is unable to take Triptans - makes her feel like her throat is closing). Also taking nortriptyline for chronic daily headache. She does note a history of FMS - taking lyrica; this diagnosis is nto found with visits to Dr. Manuella Ghazi or in her active problem list.  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  Dizziness has improved with emgality.  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  She has taken Lamisil twice in the past 15 years - it usually helps for about a year, and then the infection gradually comes back. She has seen a podiatrist in the past - she has a lot of pain in her feet, especially her LEFT great toe; has had foot Xrays and no arthritis or other abnormalities.  She  Does note ongoing intermittent burning in her feet. Recardo Evangelist does help with the burning in her feet.  Allergies & Asthma: Allergic to "everything" - dogs, cats, birds, hay, mold, mildew, grass, and seasonal.  Sore throat, hoarse voice, nasal congestion, occasional shortness of breath.  She takes 1/2 tablet Zyrtec QAM and 1/2 tablet Xyzal QPM; taking singulair daily, has albuterol to use PRN for shortness of breath. Not taking daily inhaler - they have all caused thrush in the past.  Functional Bowel Disorder: Had colonoscopy over 20 years ago and  was told she had diverticulosis - avoid triggers (seeds, peels, etc).  Does have bloating and constipation and some nausea; no diarrhea, no blood in stool or dark and tarry stool, no vomiting.  GERD: Avoids acidic foods like orange juice. Taking omeprazole 20mg  once daily - this seems to work very well for her. Tried Zantac in the past and it did not work; discussed risk of long-term PPI use.  She does get some regurgitation occasionally if she lays down after eating. Stops eating by 7pm, raises HOB.  Hypothyroidism: She has been on Synthroid for at least 3-5 years- used to take 42mcg, but had to increase.  Hair seems to be thinning since starting Nortriptyline.  Denies palpiations, chest pain. Does endorse some heat and cold intolerance.   Cardiac Murmur: Diagnosed at age 18yo - Mitral Valve prolapse with mild mitral valve insufficiency.  Did abx for a while, but is no longer doing this.  Patient Active Problem List   Diagnosis Date Noted  . Orthostatic dizziness 03/21/2016  . Dizziness 03/21/2016  . Pelvic mass in female 03/03/2016  . Intra-abdominal and pelvic swelling, mass and lump, unspecified site 03/03/2016  . 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 Family planning 09/29/2015  . Acid reflux 09/29/2015  . Cardiac murmur 09/29/2015  . Excess, menstruation 09/29/2015  . Hemorrhoid 09/29/2015  . 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 Adaptive  colitis 09/29/2015  . Embedded toenail 09/29/2015  . Irregular bleeding 09/29/2015  . Headache, migraine 09/29/2015  . 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 Cervical pain 09/29/2015  . Neutropenia ([place]) 09/29/2015  . Fungal infection of nail 09/29/2015  . Allergic rhinitis, seasonal 09/29/2015  . 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 Excessive and frequent menstruation 09/29/2015  . Gastro-esophageal reflux disease without esophagitis 09/29/2015  . Asthma 08/29/2015    Past Surgical History:  Procedure Laterality Date  . CESAREAN SECTION     . KNEE ARTHROSCOPY    . LAPAROSCOPIC TOTAL HYSTERECTOMY     with BS. Dr. Norton Blizzard [facility]  . LAPAROSCOPY Right 03/03/2016   Procedure:  diagnostic laparoscopy and right oophrectomy;  Surgeon: Aletha Halim, MD;  Location: [facility] ORS;  Service: Gynecology;  Laterality: Right;    Family History  Problem Relation Age of Onset  . Heart attack Father   . Arthritis Mother   . Hyperlipidemia Mother   . Thyroid disease Brother     Social History   Socioeconomic History  . Marital status: Married    Spouse name: Gerald Stabs  . Number of children: 2  . Years of education: Not on file  . Highest education level: Not on file  Occupational History  . Not on file  Social Needs  . Financial resource strain: Not hard at all  . Food insecurity:    Worry: Never true    Inability: Never true  . Transportation needs:    Medical: No    Non-medical: No  Tobacco Use  . Smoking status: Never Smoker  . Smokeless tobacco: Never Used  Substance and Sexual Activity  . Alcohol use: Yes    Comment: occasional  . Drug use: No  . Sexual activity: Yes  Lifestyle  . Physical activity:    Days per week: 3 days    Minutes per session: 20 min  . Stress: Not at all  Relationships  . Social connections:    Talks on phone: More than three times a week    Gets together: More than three times a week    Attends religious service: More than 4 times per year    Active member of club or organization: Yes    Attends meetings of clubs or organizations: Never    Relationship status: Married  . Intimate partner violence:    Fear of current or ex partner: No    Emotionally abused: No    Physically abused: No    Forced sexual activity: No  Other Topics Concern  . Not on file  Social History Narrative  . Not on file     Current Outpatient Medications:  .  albuterol (PROAIR HFA) 108 (90 Base) MCG/ACT inhaler, Inhale 2 puffs into the lungs every 6 (six) hours as needed for wheezing or shortness of  breath., Disp: 6.7 g, Rfl: 5 .  baclofen (LIORESAL) 10 MG tablet, Take 1 tablet (10 mg total) by mouth 3 (three) times daily as needed for muscle spasms., Disp: 30 each, Rfl: 5 .  cetirizine (ZYRTEC) 10 MG tablet, Take 10 mg by mouth daily., Disp: , Rfl:  .  ibuprofen (MOTRIN IB) 200 MG tablet, Take 3 tablets (600 mg total) by mouth every 6 (six) hours as needed for headache, mild pain or cramping., Disp: 30 tablet, Rfl: 0 .  levocetirizine (XYZAL) 5 MG tablet, TAKE 1 TABLET BY MOUTH  DAILY, Disp: 90 tablet, Rfl: 1 .  levothyroxine (SYNTHROID, LEVOTHROID) 50 MCG tablet, TAKE 1  TABLET BY MOUTH  DAILY, Disp: 90 tablet, Rfl: 1 .  montelukast (SINGULAIR) 10 MG tablet, TAKE 1 TABLET BY MOUTH  DAILY, Disp: 90 tablet, Rfl: 1 .  nortriptyline (PAMELOR) 10 MG capsule, Take 10 mg by mouth at bedtime., Disp: , Rfl:  .  omeprazole (PRILOSEC) 20 MG capsule, Take 20 mg by mouth daily., Disp: , Rfl:  .  pregabalin (LYRICA) 50 MG capsule, , Disp: , Rfl:  .  meloxicam (MOBIC) 15 MG tablet, TAKE 1 TABLET BY MOUTH EVERY DAY (Patient not taking: Reported on 03/14/2018), Disp: 30 tablet, Rfl: 0 .  topiramate (TOPAMAX) 25 MG tablet, Take 0.5 tablets (12.5 mg total) by mouth daily. (Patient not taking: Reported on 03/14/2018), Disp: 45 tablet, Rfl: 1  Allergies  Allergen Reactions  . Aspirin Other (See Comments)    Causes asthma to act up   . Erythromycin Other (See Comments)    Stomach cramping     I personally reviewed active problem list, medication list, allergies, family history, social history, health maintenance, notes from last encounter, lab results with the patient/caregiver today.   ROS Ten systems reviewed and is negative except as mentioned in HPI.  Objective  Vitals:   08/21/18 0913  BP: 120/82  Pulse: 64  Resp: 14  Temp: 98.6 F (37 C)  TempSrc: Oral  SpO2: 95%  Weight: 131 lb 8 oz (59.6 kg)  Height: 5\' 4"  (1.626 m)    Body mass index is 22.57 kg/m.  Physical Exam Constitutional:  Patient appears well-developed and well-nourished. No distress.  HENT: Head: Normocephalic and atraumatic. Ears: bilateral TMs with no erythema or effusion; Nose: Nose normal. Mouth/Throat: Oropharynx is clear and moist. No oropharyngeal exudate or tonsillar swelling.  Eyes: Conjunctivae and EOM are normal. No scleral icterus.  Pupils are equal, round, and reactive to light.  Neck: Normal range of motion. Neck supple. No JVD present. No thyromegaly present.  Cardiovascular: Normal rate, regular rhythm and 2/5 murmur present. No BLE edema. Pulmonary/Chest: Effort normal and breath sounds normal. No respiratory distress. Abdominal: Soft. Bowel sounds are normal, no distension. There is no tenderness. No masses. Musculoskeletal: Normal range of motion, no joint effusions. No gross deformities Neurological: Pt is alert and oriented to person, place, and time. No cranial nerve deficit. Coordination, balance, strength, speech and gait are normal.  Skin: Skin is warm and dry. No rash noted. No erythema.  Right great toenail is missing, no tenderness no erythema no exudate.  Right second and third toenails and left second and third toenails exhibit onychomycosis. Psychiatric: Patient has a normal mood and affect. behavior is normal. Judgment and thought content normal.  No results found for this or any previous visit (from the past 72 hour(s)).  PHQ2/9: Depression screen [facility] 2/9 08/21/2018 10/16/2017 10/09/2016 10/04/2015  Decreased Interest 0 0 0 0  Down, Depressed, Hopeless 0 0 0 0  PHQ - 2 Score 0 0 0 0  Altered sleeping 0 0 - -  Tired, decreased energy 1 3 - -  Change in appetite 0 0 - -  Feeling bad or failure about yourself  0 0 - -  Trouble concentrating 0 0 - -  Moving slowly or fidgety/restless 0 0 - -  Suicidal thoughts 0 0 - -  PHQ-9 Score - 3 - -  Difficult doing work/chores Not difficult at all Not difficult at all - -   Fall Risk: Fall Risk  08/21/2018 10/04/2015  Falls in the  past year? No No  Assessment & Plan  1. Migraine without status migrainosus, not intractable, unspecified migraine type Stable, keep follow-up with neurology  2. Fungal infection of nail We will refer to podiatry as she has had 2 rounds of Lamisil p.o. in the past, and continues to have recurrent fungal infection.  She also has missing right great toenail and describes some numbness and tingling in bilateral feet. - Ambulatory referral to Podiatry  3. Bilateral occipital neuralgia Stable, keep follow-up with neurology  4. Asthma, unspecified asthma severity, unspecified whether complicated, unspecified whether persistent Patient unable to tolerate daily inhaler will use albuterol as needed and Singulair and antihistamines daily. - albuterol (PROAIR HFA) 108 (90 Base) MCG/ACT inhaler; Inhale 2 puffs into the lungs every 6 (six) hours as needed for wheezing or shortness of breath.  Dispense: 6.7 g; Refill: 5 - levocetirizine (XYZAL) 5 MG tablet; Take 0.5 tablets (2.5 mg total) by mouth every evening.  Dispense: 45 tablet; Refill: 1  5. Seasonal allergic rhinitis, unspecified trigger -Singulair, Zyrtec, and Xyzal as prescribed. - levocetirizine (XYZAL) 5 MG tablet; Take 0.5 tablets (2.5 mg total) by mouth every evening.  Dispense: 45 tablet; Refill: 1  6. Gastro-esophageal reflux disease without esophagitis Omeprazole as prescribed; discussed risk of long-term PPI use, she verbalizes understanding.  Has trialed Zantac in the past and it did not work well.  She will continue to elevate head of bed and avoid triggers.  7. Functional bowel disorder Stable at this time.  8. History of iron deficiency - Iron, TIBC and Ferritin Panel - CBC w/Diff/Platelet  9. Disease of lipid metabolism - Lipid panel  10. Neutropenia, unspecified type ([place]) - CBC w/Diff/Platelet  11. Adult hypothyroidism - Comprehensive Metabolic Panel (CMET) - Thyroid Panel With TSH  12. Cardiac murmur - Lipid  panel - Comprehensive Metabolic Panel (CMET) - CBC w/Diff/Platelet  13. Low vitamin D level - Vitamin D (25 hydroxy)  14. Need for hepatitis C screening test - Hepatitis C antibody  15. Needs flu shot - Flu Vaccine QUAD 6+ mos PF IM (Fluarix Quad PF)  16. HIV screening declined - HIV Antibody (routine testing w rflx)  17. Pituitary cyst [facility]) Continue follow-up with neurology for periodic imaging.  18. Encounter to establish care

## 2018-09-04 ENCOUNTER — Other Ambulatory Visit: Payer: Self-pay | Admitting: Physician Assistant

## 2018-09-04 DIAGNOSIS — J302 Other seasonal allergic rhinitis: Secondary | ICD-10-CM

## 2018-09-06 LAB — COMPREHENSIVE METABOLIC PANEL
ALT: 18 IU/L (ref 0–32)
AST: 19 IU/L (ref 0–40)
Albumin/Globulin Ratio: 2.1 (ref 1.2–2.2)
Albumin: 4.6 g/dL (ref 3.5–5.5)
Alkaline Phosphatase: 52 IU/L (ref 39–117)
BUN/Creatinine Ratio: 18 (ref 9–23)
BUN: 16 mg/dL (ref 6–24)
Bilirubin Total: 0.9 mg/dL (ref 0.0–1.2)
CALCIUM: 9.6 mg/dL (ref 8.7–10.2)
CO2: 24 mmol/L (ref 20–29)
Chloride: 100 mmol/L (ref 96–106)
Creatinine, Ser: 0.89 mg/dL (ref 0.57–1.00)
GFR, EST AFRICAN AMERICAN: 91 mL/min/{1.73_m2} (ref >59)
GFR, EST NON AFRICAN AMERICAN: 79 mL/min/{1.73_m2} (ref >59)
GLOBULIN, TOTAL: 2.2 g/dL (ref 1.5–4.5)
Glucose: 72 mg/dL (ref 65–99)
Potassium: 4.3 mmol/L (ref 3.5–5.2)
Sodium: 139 mmol/L (ref 134–144)
TOTAL PROTEIN: 6.8 g/dL (ref 6.0–8.5)

## 2018-09-06 LAB — IRON,TIBC AND FERRITIN PANEL
FERRITIN: 71 ng/mL (ref 15–150)
IRON: 107 ug/dL (ref 27–159)
Iron Saturation: 36 % (ref 15–55)
Total Iron Binding Capacity: 301 ug/dL (ref 250–450)
UIBC: 194 ug/dL (ref 131–425)

## 2018-09-06 LAB — CBC WITH DIFFERENTIAL/PLATELET
Basophils Absolute: 0 10*3/uL (ref 0.0–0.2)
Basos: 1 %
EOS (ABSOLUTE): 0.1 10*3/uL (ref 0.0–0.4)
EOS: 3 %
HEMATOCRIT: 40.5 % (ref 34.0–46.6)
HEMOGLOBIN: 13.6 g/dL (ref 11.1–15.9)
Immature Grans (Abs): 0 10*3/uL (ref 0.0–0.1)
Immature Granulocytes: 0 %
LYMPHS ABS: 1.8 10*3/uL (ref 0.7–3.1)
Lymphs: 45 %
MCH: 29.8 pg (ref 26.6–33.0)
MCHC: 33.6 g/dL (ref 31.5–35.7)
MCV: 89 fL (ref 79–97)
MONOCYTES: 9 %
Monocytes Absolute: 0.4 10*3/uL (ref 0.1–0.9)
NEUTROS ABS: 1.7 10*3/uL (ref 1.4–7.0)
Neutrophils: 42 %
Platelets: 174 10*3/uL (ref 150–450)
RBC: 4.56 x10E6/uL (ref 3.77–5.28)
RDW: 12.6 % (ref 12.3–15.4)
WBC: 4.1 10*3/uL (ref 3.4–10.8)

## 2018-09-06 LAB — VITAMIN D 25 HYDROXY (VIT D DEFICIENCY, FRACTURES): Vit D, 25-Hydroxy: 40.3 ng/mL (ref 30.0–100.0)

## 2018-09-06 LAB — THYROID PANEL WITH TSH
Free Thyroxine Index: 2.5 (ref 1.2–4.9)
T3 Uptake Ratio: 28 % (ref 24–39)
T4, Total: 8.9 ug/dL (ref 4.5–12.0)
TSH: 2.81 u[IU]/mL (ref 0.450–4.500)

## 2018-09-06 LAB — HEPATITIS C ANTIBODY: Hep C Virus Ab: <0.1 s/co ratio (ref 0.0–0.9)

## 2018-09-06 LAB — LIPID PANEL
CHOL/HDL RATIO: 2.8 ratio (ref 0.0–4.4)
CHOLESTEROL TOTAL: 222 mg/dL — AB (ref 100–199)
HDL: 78 mg/dL (ref >39)
LDL CALC: 131 mg/dL — AB (ref 0–99)
Triglycerides: 63 mg/dL (ref 0–149)
VLDL CHOLESTEROL CAL: 13 mg/dL (ref 5–40)

## 2018-09-06 LAB — HIV ANTIBODY (ROUTINE TESTING W REFLEX): HIV SCREEN 4TH GENERATION: NONREACTIVE

## 2018-09-12 ENCOUNTER — Encounter: Payer: Self-pay | Admitting: Podiatry

## 2018-09-12 ENCOUNTER — Ambulatory Visit: Payer: Managed Care, Other (non HMO) | Admitting: Podiatry

## 2018-09-12 VITALS — BP 119/68 | HR 68 | Resp 16

## 2018-09-12 DIAGNOSIS — B351 Tinea unguium: Secondary | ICD-10-CM | POA: Diagnosis not present

## 2018-09-12 MED ORDER — TERBINAFINE HCL 250 MG PO TABS: 250.0000 mg | ORAL_TABLET | Freq: Every day | ORAL | 0 refills | Status: DC

## 2018-09-15 NOTE — Progress Notes (Signed)
   Subjective: 47 year old female presenting today with a chief complaint of tenderness to the right hallux that began several years ago. She reports associated thickening and discoloration of the nail. She denies modifying factors and has not done anything for treatment. Patient is here for further evaluation and treatment.   Past Medical History:  Diagnosis Date  . Allergy   . Asthma   . Embedded toenail 09/29/2015  . Family planning 09/29/2015  . Fibromyalgia   . Heart murmur   . Hemorrhoid 09/29/2015  . IBS (irritable bowel syndrome)   . Thyroid disease     Objective: Physical Exam General: The patient is alert and oriented x3 in no acute distress.  Dermatology: Hyperkeratotic, discolored, thickened, onychodystrophy of nails noted bilaterally. Skin is warm, dry and supple bilateral lower extremities. Negative for open lesions or macerations.  Vascular: Palpable pedal pulses bilaterally. No edema or erythema noted. Capillary refill within normal limits.  Neurological: Epicritic and protective threshold grossly intact bilaterally.   Musculoskeletal Exam: Pain with palpation to the right hallux. Range of motion within normal limits to all pedal and ankle joints bilateral. Muscle strength 5/5 in all groups bilateral.   Assessment: #1 onychomycosis nails 1-5 bilateral #2 capsulitis right hallux   Plan of Care:  #1 Patient was evaluated. #2 Prescription for Lamisil 250 mg #90 provided to patient.  #3 Recommended custom orthotics pending insurance coverage.  #4 Return to clinic as needed.   Edrick Kins, DPM [facility]  Dr. Edrick Kins, [address]                                        [place], [place] [address]                Office [phone]  Fax [phone]

## 2018-09-20 ENCOUNTER — Other Ambulatory Visit: Payer: Self-pay | Admitting: Physician Assistant

## 2018-09-20 DIAGNOSIS — J302 Other seasonal allergic rhinitis: Secondary | ICD-10-CM

## 2018-09-24 ENCOUNTER — Encounter: Payer: Self-pay | Admitting: Podiatry

## 2018-09-25 NOTE — Telephone Encounter (Signed)
Dr Rebekah Chesterfield please assist in diagnosis onycomycosis will not cover orthotics and this is a [place] pt that Jeannene Patella needs to take care of

## 2018-10-13 NOTE — Telephone Encounter (Signed)
Per Ed @ [employer] orthotics are covered but dx driven and capsiltitis is not a covered dx... Pt is aware and is aware of the cost 398.00. Gave pt information about payment plan with 100.00 down. She is scheduled to see Liliane Channel on 12.18.19 in [place].

## 2018-10-20 ENCOUNTER — Encounter: Payer: Self-pay | Admitting: Family Medicine

## 2018-10-20 ENCOUNTER — Other Ambulatory Visit (HOSPITAL_COMMUNITY)
Admission: RE | Admit: 2018-10-20 | Discharge: 2018-10-20 | Disposition: A | Discharge status: Home / Self Care | Payer: Managed Care, Other (non HMO) | Source: Ambulatory Visit | Attending: Family Medicine | Admitting: Family Medicine

## 2018-10-20 ENCOUNTER — Ambulatory Visit (INDEPENDENT_AMBULATORY_CARE_PROVIDER_SITE_OTHER): Payer: Managed Care, Other (non HMO) | Admitting: Family Medicine

## 2018-10-20 VITALS — BP 122/72 | HR 61 | Temp 98.1°F | Resp 14 | Ht 64.0 in | Wt 135.1 lb

## 2018-10-20 DIAGNOSIS — Z01419 Encounter for gynecological examination (general) (routine) without abnormal findings: Secondary | ICD-10-CM | POA: Diagnosis not present

## 2018-10-20 DIAGNOSIS — Z124 Encounter for screening for malignant neoplasm of cervix: Secondary | ICD-10-CM | POA: Insufficient documentation

## 2018-10-20 NOTE — Patient Instructions (Addendum)

## 2018-10-20 NOTE — Progress Notes (Signed)
Name: Brenda Price   MRN: [id number]    DOB: Jul 07, 1971   Date:10/20/2018       Progress Note  Subjective  Chief Complaint  Chief Complaint  Patient presents with  . Annual Exam    HPI   Patient presents for annual CPE.  Diet: Diet is balanced; Does not eat fast food or desserts. Exercise: Exercising 3 days a week - walking or yoga.   USPSTF grade A and B recommendations    Office Visit from 10/20/2018 in [facility]  AUDIT-C Score  1     Depression:  Depression screen [facility] 2/9 10/20/2018 08/21/2018 10/16/2017 10/09/2016 10/04/2015  Decreased Interest 0 0 0 0 0  Down, Depressed, Hopeless 0 0 0 0 0  PHQ - 2 Score 0 0 0 0 0  Altered sleeping 0 0 0 - -  Tired, decreased energy 0 1 3 - -  Change in appetite 0 0 0 - -  Feeling bad or failure about yourself  0 0 0 - -  Trouble concentrating 0 0 0 - -  Moving slowly or fidgety/restless 0 0 0 - -  Suicidal thoughts 0 0 0 - -  PHQ-9 Score 0 - 3 - -  Difficult doing work/chores Not difficult at all Not difficult at all Not difficult at all - -   Hypertension: BP Readings from Last 3 Encounters:  10/20/18 122/72  09/12/18 119/68  08/21/18 120/82   Obesity: Wt Readings from Last 3 Encounters:  10/20/18 135 lb 1.6 oz (61.3 kg)  08/21/18 131 lb 8 oz (59.6 kg)  03/14/18 131 lb (59.4 kg)   BMI Readings from Last 3 Encounters:  10/20/18 23.19 kg/m  08/21/18 22.57 kg/m  03/14/18 22.49 kg/m    Hep C Screening: Negative 2019 STD testing and prevention (HIV/chl/gon/syphilis): HIV negative; declines additional testing, Intimate partner violence: No concerns Sexual History/Pain during Intercourse: No concerns Menstrual History/LMP/Abnormal Bleeding: No bleeding. Incontinence Symptoms: Does have some stress incontinence with sneezing or jumping since her hysterectomy.  Advanced Care Planning: A voluntary discussion about advance care planning including the explanation and discussion of advance directives.   Discussed health care proxy and Living will, and the patient was able to identify a health care proxy as Brenda Price (Husband).  Patient does not have a living will at present time. If patient does have living will, I have requested they bring this to the clinic to be scanned in to their chart.  Breast cancer: Due in january No results found for: [facility]  BRCA gene screening: No family history Cervical cancer screening: She had hysterectomy done many years ago- not related to cancer; she has had a pap about 3 years ago. She does still have her cervix.   Osteoporosis Screening: No family history. Last vitamin D was normal. No results found for: HMDEXASCAN  Lipids:  The 10-year ASCVD risk score Mikey Bussing DC Brooke Bonito., et al., 2013) is: 0.5%   Values used to calculate the score:     Age: 47 years     Sex: Female     Is Non-Hispanic African American: No     Diabetic: No     Tobacco smoker: No     Systolic Blood Pressure: 034 mmHg     Is BP treated: No     HDL Cholesterol: 78 mg/dL     Total Cholesterol: 222 mg/dL  Lab Results  Component Value Date   CHOL 222 (H) 09/05/2018   CHOL 193 10/31/2017  CHOL 195 10/31/2016   Lab Results  Component Value Date   HDL 78 09/05/2018   HDL 64 10/31/2017   HDL 72 10/31/2016   Lab Results  Component Value Date   LDLCALC 131 (H) 09/05/2018   LDLCALC 112 (H) 10/31/2017   LDLCALC 113 (H) 10/31/2016   Lab Results  Component Value Date   TRIG 63 09/05/2018   TRIG 83 10/31/2017   TRIG 51 10/31/2016   Lab Results  Component Value Date   CHOLHDL 2.8 09/05/2018   CHOLHDL 3.0 10/31/2017   CHOLHDL 2.7 10/31/2016   No results found for: LDLDIRECT  Glucose:  Glucose  Date Value Ref Range Status  09/05/2018 72 65 - 99 mg/dL Final  03/14/2018 92 65 - 99 mg/dL Final  10/31/2016 80 65 - 99 mg/dL Final   Glucose, Bld  Date Value Ref Range Status  03/03/2016 126 (H) 65 - 99 mg/dL Final   Skin cancer: No concerning lesions - sees derm once a  year. Colorectal cancer: Denies family or personal history of colorectal cancer, no changes in BM's - no blood in stool, dark and tarry stool, mucus in stool, or diarrhea; has chronic constipation.  She had colonoscopy in the past due to abdominal pain - diverticulosis was found and was told to repeat at age 47.  Lung cancer: Never smoker; Low Dose CT Chest recommended if Age 42-80 years, 30 pack-year currently smoking OR have quit w/in 15years. Patient does not qualify.   ECG: Not indicated; has some shortness of breath with her asthma/allergies; no chest pain, palpitations.  Has heart murmur; EKG on file from 2017  Patient Active Problem List   Diagnosis Date Noted  . Bilateral occipital neuralgia 08/21/2018  . 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 Dizziness 03/21/2016  . 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 Cardiac murmur 09/29/2015  . 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 Headache, migraine 09/29/2015  . 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 Cervical pain 09/29/2015  . Neutropenia ([place]) 09/29/2015  . Fungal infection of nail 09/29/2015  . Allergic rhinitis, seasonal 09/29/2015  . 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 Gastro-esophageal reflux disease without esophagitis 09/29/2015  . Asthma 08/29/2015    Past Surgical History:  Procedure Laterality Date  . CESAREAN SECTION    . KNEE ARTHROSCOPY    . LAPAROSCOPIC TOTAL HYSTERECTOMY     with BS. Dr. Norton Blizzard [facility]  . LAPAROSCOPY Right 03/03/2016   Procedure:  diagnostic laparoscopy and right oophrectomy;  Surgeon: Aletha Halim, MD;  Location: [facility] ORS;  Service: Gynecology;  Laterality: Right;    Family History  Problem Relation Age of Onset  . Heart attack Father   . Arthritis Mother   . Hyperlipidemia Mother   . Thyroid disease Brother     Social History   Socioeconomic History  . Marital status: Married    Spouse name: Gerald Stabs  . Number of children: 2  . Years of education:  Not on file  . Highest education level: Not on file  Occupational History  . Not on file  Social Needs  . Financial resource strain: Not hard at all  . Food insecurity:    Worry: Never true    Inability: Never true  . Transportation needs:    Medical: No    Non-medical: No  Tobacco Use  . Smoking status: Never Smoker  . Smokeless tobacco: Never Used  Substance and Sexual Activity  . Alcohol use: Yes    Comment:  occasional  . Drug use: No  . Sexual activity: Yes    Partners: Female  Lifestyle  . Physical activity:    Days per week: 3 days    Minutes per session: 20 min  . Stress: Not at all  Relationships  . Social connections:    Talks on phone: More than three times a week    Gets together: More than three times a week    Attends religious service: More than 4 times per year    Active member of club or organization: Yes    Attends meetings of clubs or organizations: Never    Relationship status: Married  . Intimate partner violence:    Fear of current or ex partner: No    Emotionally abused: No    Physically abused: No    Forced sexual activity: No  Other Topics Concern  . Not on file  Social History Narrative  . Not on file    Current Outpatient Medications:  .  albuterol (PROAIR HFA) 108 (90 Base) MCG/ACT inhaler, Inhale 2 puffs into the lungs every 6 (six) hours as needed for wheezing or shortness of breath., Disp: 6.7 g, Rfl: 5 .  baclofen (LIORESAL) 10 MG tablet, Take 1 tablet (10 mg total) by mouth 3 (three) times daily as needed for muscle spasms., Disp: 30 each, Rfl: 5 .  cetirizine (ZYRTEC) 10 MG tablet, Take 10 mg by mouth daily., Disp: , Rfl:  .  Galcanezumab-gnlm 120 MG/ML SOAJ, Inject 120 mg into the skin every 30 (thirty) days., Disp: , Rfl:  .  ibuprofen (MOTRIN IB) 200 MG tablet, Take 3 tablets (600 mg total) by mouth every 6 (six) hours as needed for headache, mild pain or cramping., Disp: 30 tablet, Rfl: 0 .  levocetirizine (XYZAL) 5 MG tablet,  Take 0.5 tablets (2.5 mg total) by mouth every evening., Disp: 45 tablet, Rfl: 1 .  levothyroxine (SYNTHROID, LEVOTHROID) 50 MCG tablet, TAKE 1 TABLET BY MOUTH  DAILY, Disp: 90 tablet, Rfl: 1 .  montelukast (SINGULAIR) 10 MG tablet, TAKE 1 TABLET BY MOUTH  DAILY, Disp: 90 tablet, Rfl: 1 .  nortriptyline (PAMELOR) 10 MG capsule, Take 10 mg by mouth at bedtime., Disp: , Rfl:  .  omeprazole (PRILOSEC) 20 MG capsule, Take 20 mg by mouth daily., Disp: , Rfl:  .  pregabalin (LYRICA) 50 MG capsule, , Disp: , Rfl:  .  terbinafine (LAMISIL) 250 MG tablet, Take 1 tablet (250 mg total) by mouth daily. (Patient not taking: Reported on 10/20/2018), Disp: 90 tablet, Rfl: 0  Allergies  Allergen Reactions  . Aspirin Other (See Comments)    Causes asthma to act up   . Erythromycin Other (See Comments)    Stomach cramping    ROS  Constitutional: Negative for fever or weight change.  Respiratory: Negative for cough and shortness of breath.   Cardiovascular: Negative for chest pain or palpitations.  Gastrointestinal: Negative for abdominal pain, no bowel changes.  Musculoskeletal: Negative for gait problem or joint swelling.  Skin: Negative for rash.  Neurological: Negative for dizziness or headache.  No other specific complaints in a complete review of systems (except as listed in HPI above).  Objective  Vitals:   10/20/18 0957  BP: 122/72  Pulse: 61  Resp: 14  Temp: 98.1 F (36.7 C)  TempSrc: Oral  SpO2: 99%  Weight: 135 lb 1.6 oz (61.3 kg)  Height: _0  (1.626 m)    Body mass index is 23.19 kg/m.  Physical  Exam  Constitutional: Patient appears well-developed and well-nourished. No distress.  HENT: Head: Normocephalic and atraumatic. Ears: B TMs ok, no erythema or effusion; Nose: Nose normal. Mouth/Throat: Oropharynx is clear and moist. No oropharyngeal exudate.  Eyes: Conjunctivae and EOM are normal. Pupils are equal, round, and reactive to light. No scleral icterus.  Neck: Normal  range of motion. Neck supple. No JVD present. No thyromegaly present.  Cardiovascular: Normal rate, regular rhythm and normal heart sounds.  2/6 murmur heard - baseline for patient. No BLE edema. Pulmonary/Chest: Effort normal and breath sounds normal. No respiratory distress. Abdominal: Soft. Bowel sounds are normal, no distension. There is no tenderness. no masses Breast: no lumps or masses, no nipple discharge or rashes FEMALE GENITALIA:  External genitalia normal External urethra normal Vaginal vault normal without discharge or lesions Cervix normal without discharge or lesions Bimanual exam normal without masses RECTAL: no rectal masses or hemorrhoids Musculoskeletal: Normal range of motion, no joint effusions. No gross deformities Neurological: he is alert and oriented to person, place, and time. No cranial nerve deficit. Coordination, balance, strength, speech and gait are normal.  Skin: Skin is warm and dry. No rash noted. No erythema.  Psychiatric: Patient has a normal mood and affect. behavior is normal. Judgment and thought content normal.   Recent Results (from the past 2160 hour(s))  Lipid panel     Status: Abnormal   Collection Time: 09/05/18  8:08 AM  Result Value Ref Range   Cholesterol, Total 222 (H) 100 - 199 mg/dL   Triglycerides 63 0 - 149 mg/dL   HDL 78 >39 mg/dL   VLDL Cholesterol Cal 13 5 - 40 mg/dL   LDL Calculated 131 (H) 0 - 99 mg/dL   Chol/HDL Ratio 2.8 0.0 - 4.4 ratio    Comment:                                   T. Chol/HDL Ratio                                             Men  Women                               1/2 Avg.Risk  3.4    3.3                                   Avg.Risk  5.0    4.4                                2X Avg.Risk  9.6    7.1                                3X Avg.Risk 23.4   11.0   Comprehensive Metabolic Panel (CMET)     Status: None   Collection Time: 09/05/18  8:08 AM  Result Value Ref Range   Glucose 72 65 - 99 mg/dL   BUN 16  6 - 24 mg/dL   Creatinine, Ser 0.89 0.57 - 1.00 mg/dL  GFR calc non Af Amer 79 >59 mL/min/1.73   GFR calc Af Amer 91 >59 mL/min/1.73   BUN/Creatinine Ratio 18 9 - 23   Sodium 139 134 - 144 mmol/L   Potassium 4.3 3.5 - 5.2 mmol/L   Chloride 100 96 - 106 mmol/L   CO2 24 20 - 29 mmol/L   Calcium 9.6 8.7 - 10.2 mg/dL   Total Protein 6.8 6.0 - 8.5 g/dL   Albumin 4.6 3.5 - 5.5 g/dL   Globulin, Total 2.2 1.5 - 4.5 g/dL   Albumin/Globulin Ratio 2.1 1.2 - 2.2   Bilirubin Total 0.9 0.0 - 1.2 mg/dL   Alkaline Phosphatase 52 39 - 117 IU/L   AST 19 0 - 40 IU/L   ALT 18 0 - 32 IU/L  Iron, TIBC and Ferritin Panel     Status: None   Collection Time: 09/05/18  8:08 AM  Result Value Ref Range   Total Iron Binding Capacity 301 250 - 450 ug/dL   UIBC 194 131 - 425 ug/dL   Iron 107 27 - 159 ug/dL   Iron Saturation 36 15 - 55 %   Ferritin 71 15 - 150 ng/mL  Thyroid Panel With TSH     Status: None   Collection Time: 09/05/18  8:08 AM  Result Value Ref Range   TSH 2.810 0.450 - 4.500 uIU/mL   T4, Total 8.9 4.5 - 12.0 ug/dL   T3 Uptake Ratio 28 24 - 39 %   Free Thyroxine Index 2.5 1.2 - 4.9  CBC w/Diff/Platelet     Status: None   Collection Time: 09/05/18  8:08 AM  Result Value Ref Range   WBC 4.1 3.4 - 10.8 x10E3/uL   RBC 4.56 3.77 - 5.28 x10E6/uL   Hemoglobin 13.6 11.1 - 15.9 g/dL   Hematocrit 40.5 34.0 - 46.6 %   MCV 89 79 - 97 fL   MCH 29.8 26.6 - 33.0 pg   MCHC 33.6 31.5 - 35.7 g/dL   RDW 12.6 12.3 - 15.4 %   Platelets 174 150 - 450 x10E3/uL   Neutrophils 42 Not Estab. %   Lymphs 45 Not Estab. %   Monocytes 9 Not Estab. %   Eos 3 Not Estab. %   Basos 1 Not Estab. %   Neutrophils Absolute 1.7 1.4 - 7.0 x10E3/uL   Lymphocytes Absolute 1.8 0.7 - 3.1 x10E3/uL   Monocytes Absolute 0.4 0.1 - 0.9 x10E3/uL   EOS (ABSOLUTE) 0.1 0.0 - 0.4 x10E3/uL   Basophils Absolute 0.0 0.0 - 0.2 x10E3/uL   Immature Granulocytes 0 Not Estab. %   Immature Grans (Abs) 0.0 0.0 - 0.1 x10E3/uL  Vitamin D (25  hydroxy)     Status: None   Collection Time: 09/05/18  8:08 AM  Result Value Ref Range   Vit D, 25-Hydroxy 40.3 30.0 - 100.0 ng/mL    Comment: Vitamin D deficiency has been defined by the Institute of Medicine and an Endocrine Society practice guideline as a level of serum 25-OH vitamin D less than 20 ng/mL (1,2). The Endocrine Society went on to further define vitamin D insufficiency as a level between 21 and 29 ng/mL (2). 1. IOM (Institute of Medicine). 2010. Dietary reference    intakes for calcium and D. [place]: The    [employer]. 2. Holick MF, Binkley [place], Bischoff-Ferrari HA, et al.    Evaluation, treatment, and prevention of vitamin D    deficiency: an Endocrine Society clinical practice    guideline.  JCEM. 2011 Jul; 96(7):1911-30.   HIV Antibody (routine testing w rflx)     Status: None   Collection Time: 09/05/18  8:08 AM  Result Value Ref Range   HIV Screen 4th Generation wRfx Non Reactive Non Reactive  Hepatitis C antibody     Status: None   Collection Time: 09/05/18  8:08 AM  Result Value Ref Range   Hep C Virus Ab <0.1 0.0 - 0.9 s/co ratio    Comment:                                   Negative:     < 0.8                              Indeterminate: 0.8 - 0.9                                   Positive:     > 0.9  The CDC recommends that a positive HCV antibody result  be followed up with a HCV Nucleic Acid Amplification  test (330076).    PHQ2/9: Depression screen [facility] 2/9 10/20/2018 08/21/2018 10/16/2017 10/09/2016 10/04/2015  Decreased Interest 0 0 0 0 0  Down, Depressed, Hopeless 0 0 0 0 0  PHQ - 2 Score 0 0 0 0 0  Altered sleeping 0 0 0 - -  Tired, decreased energy 0 1 3 - -  Change in appetite 0 0 0 - -  Feeling bad or failure about yourself  0 0 0 - -  Trouble concentrating 0 0 0 - -  Moving slowly or fidgety/restless 0 0 0 - -  Suicidal thoughts 0 0 0 - -  PHQ-9 Score 0 - 3 - -  Difficult doing work/chores Not difficult at all Not  difficult at all Not difficult at all - -   Fall Risk: Fall Risk  10/20/2018 08/21/2018 10/04/2015  Falls in the past year? 0 No No  Number falls in past yr: 0 - -  Injury with Fall? 0 - -   Assessment & Plan  1. Well woman exam -USPSTF grade A and B recommendations reviewed with patient; age-appropriate recommendations, preventive care, screening tests, etc discussed and encouraged; healthy living encouraged; see AVS for patient education given to patient -Discussed importance of 150 minutes of physical activity weekly, eat two servings of fish weekly, eat one serving of tree nuts ( cashews, pistachios, pecans, almonds.Marland Kitchen) every other day, eat 6 servings of fruit/vegetables daily and drink plenty of water and avoid sweet beverages.  - Cytology - PAP - MM 3D SCREEN BREAST BILATERAL; Future  2. Cervical cancer screening - Cytology - PAP

## 2018-10-21 LAB — HM PAP SMEAR: HM Pap smear: NORMAL

## 2018-10-22 ENCOUNTER — Encounter: Payer: Self-pay | Admitting: Emergency Medicine

## 2018-10-22 LAB — CYTOLOGY - PAP
Diagnosis: NEGATIVE
HPV: NOT DETECTED

## 2018-10-22 NOTE — Progress Notes (Signed)
HM has been updated, per the request of Raelyn Ensign, FNP.

## 2018-10-24 ENCOUNTER — Other Ambulatory Visit: Payer: Self-pay | Admitting: Internal Medicine

## 2018-10-24 DIAGNOSIS — E237 Disorder of pituitary gland, unspecified: Secondary | ICD-10-CM

## 2018-10-29 ENCOUNTER — Ambulatory Visit (INDEPENDENT_AMBULATORY_CARE_PROVIDER_SITE_OTHER): Payer: Managed Care, Other (non HMO) | Admitting: Orthotics

## 2018-10-29 DIAGNOSIS — M5481 Occipital neuralgia: Secondary | ICD-10-CM

## 2018-10-29 DIAGNOSIS — M779 Enthesopathy, unspecified: Secondary | ICD-10-CM

## 2018-10-29 DIAGNOSIS — M7751 Other enthesopathy of right foot: Secondary | ICD-10-CM

## 2018-10-29 DIAGNOSIS — M778 Other enthesopathies, not elsewhere classified: Secondary | ICD-10-CM

## 2018-10-29 DIAGNOSIS — M7752 Other enthesopathy of left foot: Secondary | ICD-10-CM

## 2018-10-29 NOTE — Progress Notes (Signed)
Patient cast for CMFO to address 1st MPJ cap Right.  Plan on K-wedge and 1st met head cut out.

## 2018-11-18 ENCOUNTER — Ambulatory Visit: Payer: Managed Care, Other (non HMO)

## 2018-11-25 ENCOUNTER — Ambulatory Visit: Payer: Managed Care, Other (non HMO)

## 2018-11-26 ENCOUNTER — Other Ambulatory Visit (INDEPENDENT_AMBULATORY_CARE_PROVIDER_SITE_OTHER): Payer: Managed Care, Other (non HMO) | Admitting: Orthotics

## 2018-12-11 ENCOUNTER — Ambulatory Visit
Admission: RE | Admit: 2018-12-11 | Discharge: 2018-12-11 | Disposition: A | Discharge status: Home / Self Care | Payer: Managed Care, Other (non HMO) | Source: Ambulatory Visit | Attending: Family Medicine | Admitting: Family Medicine

## 2018-12-11 DIAGNOSIS — Z01419 Encounter for gynecological examination (general) (routine) without abnormal findings: Secondary | ICD-10-CM | POA: Insufficient documentation

## 2019-01-16 ENCOUNTER — Other Ambulatory Visit: Payer: Self-pay | Admitting: Physician Assistant

## 2019-01-16 DIAGNOSIS — E039 Hypothyroidism, unspecified: Secondary | ICD-10-CM

## 2019-01-23 ENCOUNTER — Other Ambulatory Visit: Payer: Self-pay | Admitting: Family Medicine

## 2019-01-23 DIAGNOSIS — J302 Other seasonal allergic rhinitis: Secondary | ICD-10-CM

## 2019-02-01 ENCOUNTER — Other Ambulatory Visit: Payer: Self-pay | Admitting: Physician Assistant

## 2019-02-01 ENCOUNTER — Telehealth: Payer: Self-pay | Admitting: Family Medicine

## 2019-02-01 DIAGNOSIS — J45909 Unspecified asthma, uncomplicated: Secondary | ICD-10-CM

## 2019-02-01 DIAGNOSIS — J302 Other seasonal allergic rhinitis: Secondary | ICD-10-CM

## 2019-02-01 DIAGNOSIS — E039 Hypothyroidism, unspecified: Secondary | ICD-10-CM

## 2019-02-01 NOTE — Telephone Encounter (Signed)
Please call patient to schedule routine follow up in about 2-3 months.

## 2019-02-02 NOTE — Telephone Encounter (Signed)
PT notified

## 2019-02-02 NOTE — Telephone Encounter (Signed)
lvm @ 9:06 informing pt that her prescriptions have been called into the pharmacy. Please schedule appt with Raquel Sarna in 2-3 months

## 2019-05-11 ENCOUNTER — Other Ambulatory Visit: Payer: Self-pay | Admitting: Physician Assistant

## 2019-05-11 DIAGNOSIS — M542 Cervicalgia: Secondary | ICD-10-CM

## 2019-05-14 NOTE — Telephone Encounter (Signed)
This is not a patient at [facility]. 

## 2019-06-08 ENCOUNTER — Other Ambulatory Visit: Payer: Self-pay | Admitting: Physician Assistant

## 2019-06-08 DIAGNOSIS — E039 Hypothyroidism, unspecified: Secondary | ICD-10-CM

## 2019-07-16 ENCOUNTER — Other Ambulatory Visit: Payer: Self-pay | Admitting: Family Medicine

## 2019-07-16 DIAGNOSIS — J302 Other seasonal allergic rhinitis: Secondary | ICD-10-CM

## 2019-10-22 ENCOUNTER — Other Ambulatory Visit: Payer: Self-pay

## 2019-10-22 ENCOUNTER — Ambulatory Visit (INDEPENDENT_AMBULATORY_CARE_PROVIDER_SITE_OTHER): Payer: Managed Care, Other (non HMO) | Admitting: Family Medicine

## 2019-10-22 ENCOUNTER — Encounter: Payer: Self-pay | Admitting: Family Medicine

## 2019-10-22 VITALS — BP 124/80 | HR 68 | Temp 97.8°F | Resp 14 | Ht 64.0 in | Wt 133.7 lb

## 2019-10-22 DIAGNOSIS — J45909 Unspecified asthma, uncomplicated: Secondary | ICD-10-CM | POA: Diagnosis not present

## 2019-10-22 DIAGNOSIS — R5382 Chronic fatigue, unspecified: Secondary | ICD-10-CM | POA: Diagnosis not present

## 2019-10-22 DIAGNOSIS — Z0000 Encounter for general adult medical examination without abnormal findings: Secondary | ICD-10-CM | POA: Diagnosis not present

## 2019-10-22 DIAGNOSIS — Z1283 Encounter for screening for malignant neoplasm of skin: Secondary | ICD-10-CM | POA: Diagnosis not present

## 2019-10-22 MED ORDER — ALBUTEROL SULFATE (2.5 MG/3ML) 0.083% IN NEBU: 2.5000 mg | INHALATION_SOLUTION | Freq: Four times a day (QID) | RESPIRATORY_TRACT | 1 refills | Status: AC | PRN

## 2019-10-22 NOTE — Progress Notes (Signed)
Name: Brenda Price   MRN: [id number]    DOB: 04-23-1971   Date:10/22/2019       Progress Note  Subjective  Chief Complaint  Chief Complaint  Patient presents with  . Annual Exam    HPI  Patient presents for annual CPE.  Diet: Balanced - tries to eat plenty of fruits and vegetables. Exercise: Walking more at home now.  Has had some increased fatigue lately - we will check labs today.  USPSTF grade A and B recommendations    Office Visit from 10/20/2018 in [facility]  AUDIT-C Score  1    Rarely  Depression: Phq 9 is  negative Depression screen [facility] 2/9 10/20/2018 08/21/2018 10/16/2017 10/09/2016 10/04/2015  Decreased Interest 0 0 0 0 0  Down, Depressed, Hopeless 0 0 0 0 0  PHQ - 2 Score 0 0 0 0 0  Altered sleeping 0 0 0 - -  Tired, decreased energy 0 1 3 - -  Change in appetite 0 0 0 - -  Feeling bad or failure about yourself  0 0 0 - -  Trouble concentrating 0 0 0 - -  Moving slowly or fidgety/restless 0 0 0 - -  Suicidal thoughts 0 0 0 - -  PHQ-9 Score 0 - 3 - -  Difficult doing work/chores Not difficult at all Not difficult at all Not difficult at all - -   Hypertension: BP Readings from Last 3 Encounters:  10/22/19 124/80  10/20/18 122/72  09/12/18 119/68   Obesity: Wt Readings from Last 3 Encounters:  10/22/19 133 lb 11.2 oz (60.6 kg)  10/20/18 135 lb 1.6 oz (61.3 kg)  08/21/18 131 lb 8 oz (59.6 kg)   BMI Readings from Last 3 Encounters:  10/22/19 22.95 kg/m  10/20/18 23.19 kg/m  08/21/18 22.57 kg/m     Hep C Screening: Negative in 2019 STD testing and prevention (HIV/chl/gon/syphilis): Negative HIV in 2019; no new partners, declines testing today Intimate partner violence: No concerns Sexual History/Pain during Intercourse:  No concerns Menstrual History/LMP/Abnormal Bleeding: Had hysterectomy - has 1 ovary left. She is starting to notice some hot flashes at night here and there.  Incontinence Symptoms: Endorses some stress  incontinence.  Doing kegels - this makes a big difference. Declines PT referral now, but may consider in the future.   Breast cancer:  - Last Mammogram: Due in January 2021; last year was negative. - BRCA gene screening: No family history  Osteoporosis: discussed high calcium and vitamin D supplementation. No family history.   Cervical cancer screening: UTD.   Skin cancer: discussed atypical lesion monitoring; she has a lot of moles and would like to see dermatology for annual skin survey Colorectal cancer: Has had colonoscopy in the past - was told she has "an extra long colon" and diverticulosis.  She does not want to go for repeat colonoscopy - discussed new USPSTF recommendation to have colonoscopy at 48yo.  Denies family or personal history of colorectal cancer, no changes in BM's - no blood in stool, dark and tarry stool, mucus in stool, or constipation/diarrhea. Lung cancer:  Never smoker Low Dose CT Chest recommended if Age 41-80 years, 30 pack-year currently smoking OR have quit w/in 15years. Patient does not qualify.   ECG: Denies chest pain, palpitations; gets some shortness of breath when her asthma flares in the fall.  Advanced Care Planning: A voluntary discussion about advance care planning including the explanation and discussion of advance directives.  Discussed health  care proxy and Living will, and the patient was able to identify a health care proxy as Tomeko Scoville (Husband) .  Patient does not have a living will at present time. If patient does have living will, I have requested they bring this to the clinic to be scanned in to their chart.  Lipids: Lab Results  Component Value Date   CHOL 222 (H) 09/05/2018   CHOL 193 10/31/2017   CHOL 195 10/31/2016   Lab Results  Component Value Date   HDL 78 09/05/2018   HDL 64 10/31/2017   HDL 72 10/31/2016   Lab Results  Component Value Date   LDLCALC 131 (H) 09/05/2018   LDLCALC 112 (H) 10/31/2017   LDLCALC 113 (H)  10/31/2016   Lab Results  Component Value Date   TRIG 63 09/05/2018   TRIG 83 10/31/2017   TRIG 51 10/31/2016   Lab Results  Component Value Date   CHOLHDL 2.8 09/05/2018   CHOLHDL 3.0 10/31/2017   CHOLHDL 2.7 10/31/2016   No results found for: LDLDIRECT  Glucose: Glucose  Date Value Ref Range Status  09/05/2018 72 65 - 99 mg/dL Final  03/14/2018 92 65 - 99 mg/dL Final  10/31/2016 80 65 - 99 mg/dL Final   Glucose, Bld  Date Value Ref Range Status  03/03/2016 126 (H) 65 - 99 mg/dL Final    Patient Active Problem List   Diagnosis Date Noted  . Bilateral occipital neuralgia 08/21/2018  . 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 Dizziness 03/21/2016  . 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 Cardiac murmur 09/29/2015  . 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 Headache, migraine 09/29/2015  . 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 Cervical pain 09/29/2015  . Neutropenia ([place]) 09/29/2015  . Fungal infection of nail 09/29/2015  . Allergic rhinitis, seasonal 09/29/2015  . 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 Gastro-esophageal reflux disease without esophagitis 09/29/2015  . Asthma 08/29/2015    Past Surgical History:  Procedure Laterality Date  . CESAREAN SECTION    . KNEE ARTHROSCOPY    . LAPAROSCOPIC TOTAL HYSTERECTOMY     with BS. Dr. Norton Blizzard OBGYN  . LAPAROSCOPY Right 03/03/2016   Procedure:  diagnostic laparoscopy and right oophrectomy;  Surgeon: Aletha Halim, MD;  Location: [facility] ORS;  Service: Gynecology;  Laterality: Right;    Family History  Problem Relation Age of Onset  . Heart attack Father   . Arthritis Mother   . Hyperlipidemia Mother   . Thyroid disease Brother   . Breast cancer Neg Hx     Social History   Socioeconomic History  . Marital status: Married    Spouse name: Gerald Stabs  . Number of children: 2  . Years of education: Not on file  . Highest education level: Not on file  Occupational History  . Not  on file  Tobacco Use  . Smoking status: Never Smoker  . Smokeless tobacco: Never Used  Substance and Sexual Activity  . Alcohol use: Yes    Comment: occasional  . Drug use: No  . Sexual activity: Yes    Partners: Female  Other Topics Concern  . Not on file  Social History Narrative  . Not on file   Social Determinants of Health   Financial Resource Strain: Low Risk   . Difficulty of Paying Living Expenses: Not hard at all  Food Insecurity: No Food Insecurity  . Worried About Charity fundraiser in the Last Year: Never true  .  Ran Out of Food in the Last Year: Never true  Transportation Needs: No Transportation Needs  . Lack of Transportation (Medical): No  . Lack of Transportation (Non-Medical): No  Physical Activity: Insufficiently Active  . Days of Exercise per Week: 3 days  . Minutes of Exercise per Session: 20 min  Stress: No Stress Concern Present  . Feeling of Stress : Not at all  Social Connections: Not Isolated  . Frequency of Communication with Friends and Family: More than three times a week  . Frequency of Social Gatherings with Friends and Family: More than three times a week  . Attends Religious Services: More than 4 times per year  . Active Member of Clubs or Organizations: Yes  . Attends Archivist Meetings: Never  . Marital Status: Married  Human resources officer Violence: Not At Risk  . Fear of Current or Ex-Partner: No  . Emotionally Abused: No  . Physically Abused: No  . Sexually Abused: No     Current Outpatient Medications:  .  baclofen (LIORESAL) 10 MG tablet, Take 1 tablet (10 mg total) by mouth 3 (three) times daily as needed for muscle spasms., Disp: 30 each, Rfl: 5 .  cetirizine (ZYRTEC) 10 MG tablet, Take 10 mg by mouth daily., Disp: , Rfl:  .  Galcanezumab-gnlm 120 MG/ML SOAJ, Inject 120 mg into the skin every 30 (thirty) days., Disp: , Rfl:  .  ibuprofen (MOTRIN IB) 200 MG tablet, Take 3 tablets (600 mg total) by mouth every 6 (six)  hours as needed for headache, mild pain or cramping., Disp: 30 tablet, Rfl: 0 .  levocetirizine (XYZAL) 5 MG tablet, TAKE ONE-HALF TABLET BY  MOUTH EVERY EVENING, Disp: 45 tablet, Rfl: 1 .  levothyroxine (SYNTHROID) 50 MCG tablet, TAKE 1 TABLET BY MOUTH  DAILY, Disp: 90 tablet, Rfl: 3 .  montelukast (SINGULAIR) 10 MG tablet, TAKE 1 TABLET BY MOUTH  DAILY, Disp: 90 tablet, Rfl: 3 .  nortriptyline (PAMELOR) 10 MG capsule, Take 10 mg by mouth at bedtime., Disp: , Rfl:  .  omeprazole (PRILOSEC) 20 MG capsule, Take 20 mg by mouth daily., Disp: , Rfl:  .  pregabalin (LYRICA) 50 MG capsule, , Disp: , Rfl:  .  PROAIR HFA 108 (90 Base) MCG/ACT inhaler, USE 2 PUFFS BY MOUTH EVERY  6 HOURS AS NEEDED FOR  WHEEZING OR SHORTNESS OF  BREATH, Disp: 17 g, Rfl: 3 .  terbinafine (LAMISIL) 250 MG tablet, Take 1 tablet (250 mg total) by mouth daily. (Patient not taking: Reported on 10/20/2018), Disp: 90 tablet, Rfl: 0  Allergies  Allergen Reactions  . Aspirin Other (See Comments)    Causes asthma to act up   . Erythromycin Other (See Comments)    Stomach cramping    ROS  Constitutional: Negative for fever or weight change.  Respiratory: Negative for cough and shortness of breath.   Cardiovascular: Negative for chest pain or palpitations.  Gastrointestinal: Negative for abdominal pain, no bowel changes.  Musculoskeletal: Negative for gait problem or joint swelling.  Skin: Negative for rash.  Neurological: Negative for dizziness or headache.  No other specific complaints in a complete review of systems (except as listed in HPI above).  Objective  Vitals:   10/22/19 0915  BP: 124/80  Pulse: 68  Resp: 14  Temp: 97.8 F (36.6 C)  TempSrc: Temporal  SpO2: 99%  Weight: 133 lb 11.2 oz (60.6 kg)  Height: _0  (1.626 m)    Body mass index is 22.95 kg/m.  Physical Exam  Constitutional: Patient appears well-developed and well-nourished. No distress.  HENT: Head: Normocephalic and atraumatic. Ears: B  TMs ok, no erythema or effusion; Nose: Nose normal. Mouth/Throat: Oropharynx is clear and moist. No oropharyngeal exudate.  Eyes: Conjunctivae and EOM are normal. Pupils are equal, round, and reactive to light. No scleral icterus.  Neck: Normal range of motion. Neck supple. No JVD present. No thyromegaly present.  Cardiovascular: Normal rate, regular rhythm and normal heart sounds.  No murmur heard. No BLE edema. Pulmonary/Chest: Effort normal and breath sounds normal. No respiratory distress. Abdominal: Soft. Bowel sounds are normal, no distension. There is no tenderness. no masses Breast: no lumps or masses, no nipple discharge or rashes FEMALE GENITALIA: Deferred Musculoskeletal: Normal range of motion, no joint effusions. No gross deformities Neurological: he is alert and oriented to person, place, and time. No cranial nerve deficit. Coordination, balance, strength, speech and gait are normal.  Skin: Skin is warm and dry. No rash noted. No erythema.  Psychiatric: Patient has a normal mood and affect. behavior is normal. Judgment and thought content normal.  No results found for this or any previous visit (from the past 2160 hour(s)).  Fall Risk: Fall Risk  10/20/2018 08/21/2018 10/04/2015  Falls in the past year? 0 No No  Number falls in past yr: 0 - -  Injury with Fall? 0 - -   Assessment & Plan  1. Well woman exam (no gynecological exam) -USPSTF grade A and B recommendations reviewed with patient; age-appropriate recommendations, preventive care, screening tests, etc discussed and encouraged; healthy living encouraged; see AVS for patient education given to patient -Discussed importance of 150 minutes of physical activity weekly, eat two servings of fish weekly, eat one serving of tree nuts ( cashews, pistachios, pecans, almonds.Marland Kitchen) every other day, eat 6 servings of fruit/vegetables daily and drink plenty of water and avoid sweet beverages.  - Comprehensive metabolic panel - CBC with  Differential/Platelet - Lipid panel  2. Skin cancer screening - MM 3D SCREEN BREAST BILATERAL; Future - Ambulatory referral to Dermatology  3. Asthma, unspecified asthma severity, unspecified whether complicated, unspecified whether persistent - albuterol (PROVENTIL) (2.5 MG/3ML) 0.083% nebulizer solution; Take 3 mLs (2.5 mg total) by nebulization every 6 (six) hours as needed for wheezing or shortness of breath.  Dispense: 150 mL; Refill: 1  4. Chronic fatigue - MM 3D SCREEN BREAST BILATERAL; Future - Ambulatory referral to Dermatology - albuterol (PROVENTIL) (2.5 MG/3ML) 0.083% nebulizer solution; Take 3 mLs (2.5 mg total) by nebulization every 6 (six) hours as needed for wheezing or shortness of breath.  Dispense: 150 mL; Refill: 1 - Comprehensive metabolic panel - CBC with Differential/Platelet - Thyroid Panel With TSH - VITAMIN D 25 Hydroxy (Vit-D Deficiency, Fractures) - B12 and Folate Panel - Iron, TIBC and Ferritin Panel

## 2019-11-02 ENCOUNTER — Other Ambulatory Visit: Payer: Self-pay | Admitting: Internal Medicine

## 2019-11-02 DIAGNOSIS — D352 Benign neoplasm of pituitary gland: Secondary | ICD-10-CM

## 2019-11-04 LAB — COMPREHENSIVE METABOLIC PANEL
ALT: 16 IU/L (ref 0–32)
AST: 22 IU/L (ref 0–40)
Albumin/Globulin Ratio: 2.2 (ref 1.2–2.2)
Albumin: 4.7 g/dL (ref 3.8–4.8)
Alkaline Phosphatase: 64 IU/L (ref 39–117)
BUN/Creatinine Ratio: 14 (ref 9–23)
BUN: 12 mg/dL (ref 6–24)
Bilirubin Total: 0.7 mg/dL (ref 0.0–1.2)
CO2: 23 mmol/L (ref 20–29)
Calcium: 8.9 mg/dL (ref 8.7–10.2)
Chloride: 102 mmol/L (ref 96–106)
Creatinine, Ser: 0.85 mg/dL (ref 0.57–1.00)
GFR calc Af Amer: 94 mL/min/{1.73_m2} (ref >59)
GFR calc non Af Amer: 82 mL/min/{1.73_m2} (ref >59)
Globulin, Total: 2.1 g/dL (ref 1.5–4.5)
Glucose: 83 mg/dL (ref 65–99)
Potassium: 4.5 mmol/L (ref 3.5–5.2)
Sodium: 139 mmol/L (ref 134–144)
Total Protein: 6.8 g/dL (ref 6.0–8.5)

## 2019-11-04 LAB — THYROID PANEL WITH TSH
Free Thyroxine Index: 2.7 (ref 1.2–4.9)
T3 Uptake Ratio: 30 % (ref 24–39)
T4, Total: 9.1 ug/dL (ref 4.5–12.0)
TSH: 3.46 u[IU]/mL (ref 0.450–4.500)

## 2019-11-04 LAB — CBC WITH DIFFERENTIAL/PLATELET
Basophils Absolute: 0.1 10*3/uL (ref 0.0–0.2)
Basos: 1 %
EOS (ABSOLUTE): 0.2 10*3/uL (ref 0.0–0.4)
Eos: 4 %
Hematocrit: 40 % (ref 34.0–46.6)
Hemoglobin: 13.3 g/dL (ref 11.1–15.9)
Immature Grans (Abs): 0 10*3/uL (ref 0.0–0.1)
Immature Granulocytes: 0 %
Lymphocytes Absolute: 1.7 10*3/uL (ref 0.7–3.1)
Lymphs: 39 %
MCH: 29.6 pg (ref 26.6–33.0)
MCHC: 33.3 g/dL (ref 31.5–35.7)
MCV: 89 fL (ref 79–97)
Monocytes Absolute: 0.3 10*3/uL (ref 0.1–0.9)
Monocytes: 8 %
Neutrophils Absolute: 2 10*3/uL (ref 1.4–7.0)
Neutrophils: 48 %
Platelets: 161 10*3/uL (ref 150–450)
RBC: 4.5 x10E6/uL (ref 3.77–5.28)
RDW: 12.7 % (ref 11.7–15.4)
WBC: 4.3 10*3/uL (ref 3.4–10.8)

## 2019-11-04 LAB — LIPID PANEL
Chol/HDL Ratio: 2.5 ratio (ref 0.0–4.4)
Cholesterol, Total: 195 mg/dL (ref 100–199)
HDL: 79 mg/dL (ref >39)
LDL Chol Calc (NIH): 105 mg/dL — ABNORMAL HIGH (ref 0–99)
Triglycerides: 59 mg/dL (ref 0–149)
VLDL Cholesterol Cal: 11 mg/dL (ref 5–40)

## 2019-11-04 LAB — B12 AND FOLATE PANEL
Folate: >20 ng/mL (ref >3.0)
Vitamin B-12: 917 pg/mL (ref 232–1245)

## 2019-11-04 LAB — IRON,TIBC AND FERRITIN PANEL
Ferritin: 56 ng/mL (ref 15–150)
Iron Saturation: 33 % (ref 15–55)
Iron: 100 ug/dL (ref 27–159)
Total Iron Binding Capacity: 307 ug/dL (ref 250–450)
UIBC: 207 ug/dL (ref 131–425)

## 2019-11-04 LAB — VITAMIN D 25 HYDROXY (VIT D DEFICIENCY, FRACTURES): Vit D, 25-Hydroxy: 40.4 ng/mL (ref 30.0–100.0)

## 2019-11-12 ENCOUNTER — Ambulatory Visit
Admission: RE | Admit: 2019-11-12 | Discharge: 2019-11-12 | Disposition: A | Discharge status: Home / Self Care | Payer: Managed Care, Other (non HMO) | Source: Ambulatory Visit | Attending: Internal Medicine | Admitting: Internal Medicine

## 2019-11-12 ENCOUNTER — Other Ambulatory Visit: Payer: Self-pay

## 2019-11-12 DIAGNOSIS — D352 Benign neoplasm of pituitary gland: Secondary | ICD-10-CM | POA: Insufficient documentation

## 2019-11-12 MED ORDER — GADOBUTROL 1 MMOL/ML IV SOLN
6.0000 mL | Freq: Once | INTRAVENOUS | Status: AC | PRN
  Administered 2019-11-12: 10:00:00 6 mL via INTRAVENOUS

## 2019-11-17 ENCOUNTER — Other Ambulatory Visit: Payer: Self-pay | Admitting: Family Medicine

## 2019-11-17 DIAGNOSIS — J302 Other seasonal allergic rhinitis: Secondary | ICD-10-CM

## 2019-11-17 DIAGNOSIS — J45909 Unspecified asthma, uncomplicated: Secondary | ICD-10-CM

## 2019-11-18 ENCOUNTER — Telehealth: Payer: Managed Care, Other (non HMO)

## 2019-11-18 ENCOUNTER — Ambulatory Visit
Admission: EM | Admit: 2019-11-18 | Discharge: 2019-11-18 | Disposition: A | Discharge status: Home / Self Care | Payer: Managed Care, Other (non HMO) | Attending: Emergency Medicine | Admitting: Emergency Medicine

## 2019-11-18 ENCOUNTER — Other Ambulatory Visit: Payer: Self-pay

## 2019-11-18 DIAGNOSIS — M62838 Other muscle spasm: Secondary | ICD-10-CM | POA: Diagnosis not present

## 2019-11-18 DIAGNOSIS — W19XXXA Unspecified fall, initial encounter: Secondary | ICD-10-CM

## 2019-11-18 MED ORDER — IBUPROFEN 800 MG PO TABS: 800.0000 mg | ORAL_TABLET | Freq: Three times a day (TID) | ORAL | 0 refills | Status: DC | PRN

## 2019-11-18 MED ORDER — CYCLOBENZAPRINE HCL 10 MG PO TABS: 10.0000 mg | ORAL_TABLET | Freq: Two times a day (BID) | ORAL | 0 refills | Status: DC | PRN

## 2019-11-18 NOTE — ED Triage Notes (Signed)
Pt presents with chin injury, neck pain, bilateral shoulder pain, and back pain after a fall on wooden stairs on Monday while trying to walk into her house. No LOC

## 2019-11-18 NOTE — ED Provider Notes (Signed)
Brenda Price    CSN: [id number] Arrival date & time: 11/18/19  1401      History   Chief Complaint Chief Complaint  Patient presents with  . Fall    HPI Brenda Price is a 49 y.o. female.   Patient presents with muscular pain in her shoulders and upper back x3 days.  The discomfort began after she fell at home on some steps.  She denies head injury or LOC.  She has a bruise on her chin from the fall.  She denies injury to her teeth or mouth.  She denies numbness, weakness, paresthesias.  She has attempted treatment at home with Tylenol and ibuprofen.    The history is provided by the patient.    Past Medical History:  Diagnosis Date  . Allergy   . Asthma   . Embedded toenail 09/29/2015  . Family planning 09/29/2015  . Fibromyalgia   . Heart murmur   . Hemorrhoid 09/29/2015  . IBS (irritable bowel syndrome)   . Thyroid disease     Patient Active Problem List   Diagnosis Date Noted  . Bilateral occipital neuralgia 08/21/2018  . 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 Dizziness 03/21/2016  . 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 Cardiac murmur 09/29/2015  . 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 Headache, migraine 09/29/2015  . 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 Cervical pain 09/29/2015  . Neutropenia ([place]) 09/29/2015  . Fungal infection of nail 09/29/2015  . Allergic rhinitis, seasonal 09/29/2015  . 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 Gastro-esophageal reflux disease without esophagitis 09/29/2015  . Asthma 08/29/2015    Past Surgical History:  Procedure Laterality Date  . CESAREAN SECTION    . KNEE ARTHROSCOPY    . LAPAROSCOPIC TOTAL HYSTERECTOMY     with BS. Dr. Norton Blizzard OBGYN  . LAPAROSCOPY Right 03/03/2016   Procedure:  diagnostic laparoscopy and right oophrectomy;  Surgeon: Aletha Halim, MD;  Location: [facility] ORS;  Service: Gynecology;  Laterality: Right;    OB History    Gravida  2   Para  1   Term      Preterm      AB      Living        SAB      TAB      Ectopic      Multiple      Live Births               Home Medications    Prior to Admission medications   Medication Sig Start Date End Date Taking? Authorizing Provider  albuterol (PROVENTIL) (2.5 MG/3ML) 0.083% nebulizer solution Take 3 mLs (2.5 mg total) by nebulization every 6 (six) hours as needed for wheezing or shortness of breath. 10/22/19   Hubbard Hartshorn, FNP  baclofen (LIORESAL) 10 MG tablet Take 1 tablet (10 mg total) by mouth 3 (three) times daily as needed for muscle spasms. 10/11/17   Trinna Post, PA-C  cetirizine (ZYRTEC) 10 MG tablet Take 10 mg by mouth daily.    [provider]  cyclobenzaprine (FLEXERIL) 10 MG tablet Take 1 tablet (10 mg total) by mouth 2 (two) times daily as needed for muscle spasms. 11/18/19   Sharion Balloon, NP  Galcanezumab-gnlm 120 MG/ML SOAJ Inject 120 mg into the skin every 30 (thirty) days.    [provider]  ibuprofen (ADVIL) 800 MG tablet Take 1 tablet (800 mg total) by mouth every  8 (eight) hours as needed. 11/18/19   Sharion Balloon, NP  ketorolac (TORADOL) 10 MG tablet Take 10 mg by mouth every 6 (six) hours as needed. 10/16/19   [provider]  levocetirizine (XYZAL) 5 MG tablet TAKE ONE-HALF TABLET BY  MOUTH IN THE EVENING 11/18/19   Hubbard Hartshorn, FNP  levothyroxine (SYNTHROID) 50 MCG tablet TAKE 1 TABLET BY MOUTH  DAILY 06/08/19   Mar Daring, PA-C  montelukast (SINGULAIR) 10 MG tablet TAKE 1 TABLET BY MOUTH  DAILY 07/16/19   Hubbard Hartshorn, FNP  nortriptyline (PAMELOR) 10 MG capsule Take 10 mg by mouth at bedtime.    [provider]  omeprazole (PRILOSEC) 20 MG capsule Take 20 mg by mouth daily.    [provider]  ondansetron (ZOFRAN) 4 MG tablet  10/16/19   [provider]  pregabalin (LYRICA) 50 MG capsule  10/07/17   [provider]  PROAIR HFA 108 (90 Base) MCG/ACT inhaler USE 2 PUFFS BY  MOUTH EVERY  6 HOURS AS NEEDED FOR  WHEEZING OR SHORTNESS OF  BREATH 02/01/19   Hubbard Hartshorn, FNP  promethazine (PHENERGAN) 12.5 MG tablet Take 12.5 mg by mouth. 10/16/19   [provider]  terbinafine (LAMISIL) 250 MG tablet Take 1 tablet (250 mg total) by mouth daily. Patient not taking: Reported on 10/20/2018 09/12/18   Edrick Kins, DPM    Family History Family History  Problem Relation Age of Onset  . Heart attack Father   . Arthritis Mother   . Hyperlipidemia Mother   . Thyroid disease Brother   . Breast cancer Neg Hx     Social History Social History   Tobacco Use  . Smoking status: Never Smoker  . Smokeless tobacco: Never Used  Substance Use Topics  . Alcohol use: Yes    Comment: occasional  . Drug use: No     Allergies   Aspirin and Erythromycin   Review of Systems Review of Systems  Constitutional: Negative for chills and fever.  HENT: Negative for ear pain and sore throat.   Eyes: Negative for pain and visual disturbance.  Respiratory: Negative for cough and shortness of breath.   Cardiovascular: Negative for chest pain and palpitations.  Gastrointestinal: Negative for abdominal pain and vomiting.  Genitourinary: Negative for dysuria and hematuria.  Musculoskeletal: Positive for myalgias. Negative for arthralgias and back pain.  Skin: Negative for rash.  Neurological: Negative for seizures, syncope, weakness and numbness.  All other systems reviewed and are negative.    Physical Exam Triage Vital Signs ED Triage Vitals  Enc Vitals Group     BP      Pulse      Resp      Temp      Temp src      SpO2      Weight      Height      Head Circumference      Peak Flow      Pain Score      Pain Loc      Pain Edu?      Excl. in [place]?    No data found.  Updated Vital Signs BP 130/84 (BP Location: Right Arm)   Pulse 85   Temp 98.7 F (37.1 C) (Oral)   Resp 16   SpO2 98%   Visual Acuity Right Eye Distance:   Left Eye Distance:     Bilateral Distance:    Right Eye Near:  Left Eye Near:    Bilateral Near:     Physical Exam Vitals and nursing note reviewed.  Constitutional:      General: She is not in acute distress.    Appearance: She is well-developed. She is not ill-appearing.  HENT:     Head: Normocephalic and atraumatic.     Right Ear: Tympanic membrane normal.     Left Ear: Tympanic membrane normal.     Nose: Nose normal.     Mouth/Throat:     Mouth: Mucous membranes are moist.     Pharynx: Oropharynx is clear.  Eyes:     Conjunctiva/sclera: Conjunctivae normal.  Cardiovascular:     Rate and Rhythm: Normal rate and regular rhythm.     Heart sounds: No murmur.  Pulmonary:     Effort: Pulmonary effort is normal. No respiratory distress.     Breath sounds: Normal breath sounds.  Abdominal:     General: Bowel sounds are normal.     Palpations: Abdomen is soft.     Tenderness: There is no abdominal tenderness. There is no guarding or rebound.  Musculoskeletal:        General: Tenderness present. No swelling or deformity. Normal range of motion.     Cervical back: Neck supple.     Comments: Tenderness to palpation of bilateral trapezius.  Skin:    General: Skin is warm and dry.     Findings: Bruising present. No rash.     Comments: Ecchymosis under chin; no lesions.    Neurological:     General: No focal deficit present.     Mental Status: She is alert and oriented to person, place, and time.     Sensory: No sensory deficit.     Motor: No weakness.     Gait: Gait normal.  Psychiatric:        Mood and Affect: Mood normal.        Behavior: Behavior normal.      UC Treatments / Results  Labs (all labs ordered are listed, but only abnormal results are displayed) Labs Reviewed - No data to display  EKG   Radiology No results found.  Procedures Procedures (including critical care time)  Medications Ordered in UC Medications - No data to display  Initial Impression / Assessment and  Plan / UC Course  I have reviewed the triage vital signs and the nursing notes.  Pertinent labs & imaging results that were available during my care of the patient were reviewed by me and considered in my medical decision making (see chart for details).    Muscle spasm due to fall on 11/16/2019.  Treating with ibuprofen and Flexeril.  Precautions for drowsiness with Flexeril discussed with patient.  Instructed her to follow-up with her PCP or an orthopedist if her symptoms persist.  Instructed her to go to the emergency department she has increased pain or new symptoms such as weakness, numbness, fever, chills, paresthesias, or other concerns.     Final Clinical Impressions(s) / UC Diagnoses   Final diagnoses:  Muscle spasm  Fall, initial encounter     Discharge Instructions     Take the prescribed ibuprofen as needed for your pain.  Take the muscle relaxer Flexeril as needed for muscle spasm; do not drive, operate machinery, or drink alcohol with this medication as it may make you drowsy.    Follow up with an orthopedist or your primary care provider if your pain is not improving.  Go to the emergency  department if you have worsening pain or develop new symptoms such as difficulty with urination, weakness, numbness, fever, chills or other concerns.     ED Prescriptions    Medication Sig Dispense Auth. Provider   ibuprofen (ADVIL) 800 MG tablet Take 1 tablet (800 mg total) by mouth every 8 (eight) hours as needed. 21 tablet Sharion Balloon, NP   cyclobenzaprine (FLEXERIL) 10 MG tablet Take 1 tablet (10 mg total) by mouth 2 (two) times daily as needed for muscle spasms. 20 tablet Sharion Balloon, NP     I have reviewed the PDMP during this encounter.   Sharion Balloon, NP 11/18/19 [phone]

## 2019-11-18 NOTE — Discharge Instructions (Addendum)
Take the prescribed ibuprofen as needed for your pain.  Take the muscle relaxer Flexeril as needed for muscle spasm; do not drive, operate machinery, or drink alcohol with this medication as it may make you drowsy.    Follow up with an orthopedist or your primary care provider if your pain is not improving.  Go to the emergency department if you have worsening pain or develop new symptoms such as difficulty with urination, weakness, numbness, fever, chills or other concerns.

## 2020-01-23 ENCOUNTER — Ambulatory Visit: Payer: Managed Care, Other (non HMO) | Attending: Internal Medicine

## 2020-01-23 DIAGNOSIS — Z23 Encounter for immunization: Secondary | ICD-10-CM

## 2020-01-23 NOTE — Progress Notes (Signed)
   Covid-19 Vaccination Clinic  Name:  Brenda Price    MRN: [id number] DOB: Mar 22, 1971  01/23/2020  [name] was observed post Covid-19 immunization for 15 minutes without incident. She was provided with Vaccine Information Sheet and instruction to access the V-Safe system.   [name] was instructed to call 911 with any severe reactions post vaccine: Marland Kitchen Difficulty breathing  . Swelling of face and throat  . A fast heartbeat  . A bad rash all over body  . Dizziness and weakness   Immunizations Administered    Name Date Dose VIS Date Route   Pfizer COVID-19 Vaccine 01/23/2020  1:20 PM 0.3 mL 10/23/2019 Intramuscular   Manufacturer: [place]   Lot: [id number]   [place]: [id number]

## 2020-01-25 ENCOUNTER — Other Ambulatory Visit: Payer: Self-pay | Admitting: Family Medicine

## 2020-01-25 ENCOUNTER — Ambulatory Visit
Admission: RE | Admit: 2020-01-25 | Discharge: 2020-01-25 | Disposition: A | Discharge status: Home / Self Care | Payer: Managed Care, Other (non HMO) | Source: Ambulatory Visit | Attending: Family Medicine | Admitting: Family Medicine

## 2020-01-25 DIAGNOSIS — R5382 Chronic fatigue, unspecified: Secondary | ICD-10-CM

## 2020-01-25 DIAGNOSIS — Z1231 Encounter for screening mammogram for malignant neoplasm of breast: Secondary | ICD-10-CM | POA: Diagnosis present

## 2020-01-25 DIAGNOSIS — Z1283 Encounter for screening for malignant neoplasm of skin: Secondary | ICD-10-CM | POA: Diagnosis not present

## 2020-01-25 DIAGNOSIS — R928 Other abnormal and inconclusive findings on diagnostic imaging of breast: Secondary | ICD-10-CM

## 2020-01-25 DIAGNOSIS — N6489 Other specified disorders of breast: Secondary | ICD-10-CM

## 2020-02-08 ENCOUNTER — Ambulatory Visit
Admission: RE | Admit: 2020-02-08 | Discharge: 2020-02-08 | Disposition: A | Discharge status: Home / Self Care | Payer: Managed Care, Other (non HMO) | Source: Ambulatory Visit | Attending: Family Medicine | Admitting: Family Medicine

## 2020-02-08 DIAGNOSIS — N6489 Other specified disorders of breast: Secondary | ICD-10-CM

## 2020-02-08 DIAGNOSIS — R928 Other abnormal and inconclusive findings on diagnostic imaging of breast: Secondary | ICD-10-CM

## 2020-02-23 ENCOUNTER — Ambulatory Visit: Payer: Managed Care, Other (non HMO) | Attending: Internal Medicine

## 2020-02-23 DIAGNOSIS — Z23 Encounter for immunization: Secondary | ICD-10-CM

## 2020-02-23 NOTE — Progress Notes (Signed)
   Covid-19 Vaccination Clinic  Name:  JACIE VANWERT    MRN: [id number] DOB: 07-02-1971  02/23/2020  Ms. Dunnaway was observed post Covid-19 immunization for 30 minutes based on pre-vaccination screening without incident. She was provided with Vaccine Information Sheet and instruction to access the V-Safe system.   Ms. School was instructed to call 911 with any severe reactions post vaccine: Marland Kitchen Difficulty breathing  . Swelling of face and throat  . A fast heartbeat  . A bad rash all over body  . Dizziness and weakness   Immunizations Administered    Name Date Dose VIS Date Route   Pfizer COVID-19 Vaccine 02/23/2020  9:01 AM 0.3 mL 10/23/2019 Intramuscular   Manufacturer: [place]   Lot: [id number]   [place]: [id number]

## 2020-03-14 ENCOUNTER — Ambulatory Visit: Payer: Managed Care, Other (non HMO) | Admitting: Dermatology

## 2020-04-08 ENCOUNTER — Other Ambulatory Visit: Payer: Self-pay

## 2020-04-08 ENCOUNTER — Emergency Department: Payer: Managed Care, Other (non HMO)

## 2020-04-08 ENCOUNTER — Emergency Department
Admission: EM | Admit: 2020-04-08 | Discharge: 2020-04-08 | Disposition: A | Discharge status: Home / Self Care | Payer: Managed Care, Other (non HMO) | Attending: Emergency Medicine | Admitting: Emergency Medicine

## 2020-04-08 DIAGNOSIS — E039 Hypothyroidism, unspecified: Secondary | ICD-10-CM | POA: Diagnosis not present

## 2020-04-08 DIAGNOSIS — R519 Headache, unspecified: Secondary | ICD-10-CM | POA: Diagnosis present

## 2020-04-08 DIAGNOSIS — Z79899 Other long term (current) drug therapy: Secondary | ICD-10-CM | POA: Diagnosis not present

## 2020-04-08 DIAGNOSIS — G43809 Other migraine, not intractable, without status migrainosus: Secondary | ICD-10-CM | POA: Diagnosis not present

## 2020-04-08 DIAGNOSIS — G510 Bell's palsy: Secondary | ICD-10-CM | POA: Diagnosis not present

## 2020-04-08 DIAGNOSIS — J45909 Unspecified asthma, uncomplicated: Secondary | ICD-10-CM | POA: Diagnosis not present

## 2020-04-08 LAB — COMPREHENSIVE METABOLIC PANEL
ALT: 18 U/L (ref 0–44)
AST: 24 U/L (ref 15–41)
Albumin: 5.1 g/dL — ABNORMAL HIGH (ref 3.5–5.0)
Alkaline Phosphatase: 51 U/L (ref 38–126)
Anion gap: 10 (ref 5–15)
BUN: 12 mg/dL (ref 6–20)
CO2: 27 mmol/L (ref 22–32)
Calcium: 9.7 mg/dL (ref 8.9–10.3)
Chloride: 100 mmol/L (ref 98–111)
Creatinine, Ser: 0.8 mg/dL (ref 0.44–1.00)
GFR calc Af Amer: >60 mL/min (ref >60)
GFR calc non Af Amer: >60 mL/min (ref >60)
Glucose, Bld: 97 mg/dL (ref 70–99)
Potassium: 3.9 mmol/L (ref 3.5–5.1)
Sodium: 137 mmol/L (ref 135–145)
Total Bilirubin: 1.2 mg/dL (ref 0.3–1.2)
Total Protein: 7.7 g/dL (ref 6.5–8.1)

## 2020-04-08 LAB — URINE DRUG SCREEN, QUALITATIVE (ARMC ONLY)
Amphetamines, Ur Screen: NOT DETECTED
Barbiturates, Ur Screen: NOT DETECTED
Benzodiazepine, Ur Scrn: NOT DETECTED
Cannabinoid 50 Ng, Ur ~~LOC~~: NOT DETECTED
Cocaine Metabolite,Ur ~~LOC~~: NOT DETECTED
MDMA (Ecstasy)Ur Screen: NOT DETECTED
Methadone Scn, Ur: NOT DETECTED
Opiate, Ur Screen: NOT DETECTED
Phencyclidine (PCP) Ur S: NOT DETECTED
Tricyclic, Ur Screen: POSITIVE — AB

## 2020-04-08 LAB — DIFFERENTIAL
Abs Immature Granulocytes: 0.01 10*3/uL (ref 0.00–0.07)
Basophils Absolute: 0 10*3/uL (ref 0.0–0.1)
Basophils Relative: 1 %
Eosinophils Absolute: 0.1 10*3/uL (ref 0.0–0.5)
Eosinophils Relative: 3 %
Immature Granulocytes: 0 %
Lymphocytes Relative: 39 %
Lymphs Abs: 1.5 10*3/uL (ref 0.7–4.0)
Monocytes Absolute: 0.3 10*3/uL (ref 0.1–1.0)
Monocytes Relative: 7 %
Neutro Abs: 1.9 10*3/uL (ref 1.7–7.7)
Neutrophils Relative %: 50 %

## 2020-04-08 LAB — URINALYSIS, ROUTINE W REFLEX MICROSCOPIC
Bilirubin Urine: NEGATIVE
Glucose, UA: NEGATIVE mg/dL
Hgb urine dipstick: NEGATIVE
Ketones, ur: NEGATIVE mg/dL
Leukocytes,Ua: NEGATIVE
Nitrite: NEGATIVE
Protein, ur: NEGATIVE mg/dL
Specific Gravity, Urine: 1.005 (ref 1.005–1.030)
pH: 7 (ref 5.0–8.0)

## 2020-04-08 LAB — CBC
HCT: 39.7 % (ref 36.0–46.0)
Hemoglobin: 14.1 g/dL (ref 12.0–15.0)
MCH: 30.1 pg (ref 26.0–34.0)
MCHC: 35.5 g/dL (ref 30.0–36.0)
MCV: 84.6 fL (ref 80.0–100.0)
Platelets: 190 10*3/uL (ref 150–400)
RBC: 4.69 MIL/uL (ref 3.87–5.11)
RDW: 13.1 % (ref 11.5–15.5)
WBC: 3.9 10*3/uL — ABNORMAL LOW (ref 4.0–10.5)
nRBC: 0 % (ref 0.0–0.2)

## 2020-04-08 LAB — APTT: aPTT: 37 seconds — ABNORMAL HIGH (ref 24–36)

## 2020-04-08 LAB — PROTIME-INR
INR: 1 (ref 0.8–1.2)
Prothrombin Time: 13.2 seconds (ref 11.4–15.2)

## 2020-04-08 LAB — POCT PREGNANCY, URINE: Preg Test, Ur: NEGATIVE

## 2020-04-08 LAB — ETHANOL: Alcohol, Ethyl (B): <10 mg/dL (ref <10)

## 2020-04-08 MED ORDER — PREDNISONE 20 MG PO TABS: 60.0000 mg | ORAL_TABLET | Freq: Every day | ORAL | 0 refills | Status: AC

## 2020-04-08 MED ORDER — PROCHLORPERAZINE EDISYLATE 10 MG/2ML IJ SOLN
10.0000 mg | Freq: Once | INTRAMUSCULAR | Status: AC
  Administered 2020-04-08: 10 mg via INTRAVENOUS
  Filled 2020-04-08: qty 2

## 2020-04-08 MED ORDER — SODIUM CHLORIDE 0.9 % IV SOLN: Freq: Once | INTRAVENOUS | Status: AC

## 2020-04-08 NOTE — ED Notes (Signed)
Pt continues with inability to raise right eyebrow and noted asymmetry to smile with less movement on right.  PT states headache continues.  Medications given per order, see MAR.  Pt understanding of POC at this time.  Pt remains in subwait. Will continue to monitor.

## 2020-04-08 NOTE — ED Notes (Signed)
Provider states pt is to be discharged and that the stroke [place] screen is not needed.

## 2020-04-08 NOTE — ED Triage Notes (Signed)
Pt states that she started with her headache yesterday and states that it reminds her of her typical migraine, pt went to neurology for a reg follow up which she happened to have a migraine, provider was concerned due to pt being unable to raise her right eyebrow, pt also noted to have some asymmetry when smiling on the right side, pt denies weakness to either side of body, pt has equal hand grips and is able to hold arms suspended in air without drift

## 2020-04-08 NOTE — ED Provider Notes (Signed)
[facility] Emergency Department Provider Note   ____________________________________________   I have reviewed the triage vital signs and the nursing notes.   HISTORY  Chief Complaint Headache   History limited by: Not Limited   HPI Brenda Price is a 49 y.o. female who presents to the emergency department today from neurology office because of concerns for right forehead weakness.  The patient states that she was her neurologist appointment she has been suffering from migraine headaches.  She has history of migraine headaches.  While there the neurologist noticed some right forehead weakness.  Patient denies noticing this herself.  She has had some numbness to the right temple but states she has had this in the past.  At the time my exam the patient states that her migraine headache has greatly improved.  Patient denies any vision change.  Denies any change in hearing.  Denies any change in speech.  Records reviewed. Per medical record review patient has a history of migraine.  Past Medical History:  Diagnosis Date  . Allergy   . Asthma   . Embedded toenail 09/29/2015  . Family planning 09/29/2015  . Fibromyalgia   . Heart murmur   . Hemorrhoid 09/29/2015  . IBS (irritable bowel syndrome)   . Thyroid disease     Patient Active Problem List   Diagnosis Date Noted  . Bilateral occipital neuralgia 08/21/2018  . 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 Dizziness 03/21/2016  . 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 Cardiac murmur 09/29/2015  . 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 Headache, migraine 09/29/2015  . 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 Cervical pain 09/29/2015  . Neutropenia ([place]) 09/29/2015  . Fungal infection of nail 09/29/2015  . Allergic rhinitis, seasonal 09/29/2015  . 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 Gastro-esophageal reflux disease without esophagitis 09/29/2015  . Asthma 08/29/2015     Past Surgical History:  Procedure Laterality Date  . ABDOMINAL HYSTERECTOMY    . CESAREAN SECTION    . KNEE ARTHROSCOPY    . LAPAROSCOPIC TOTAL HYSTERECTOMY     with BS. Dr. Norton Blizzard [facility]  . LAPAROSCOPY Right 03/03/2016   Procedure:  diagnostic laparoscopy and right oophrectomy;  Surgeon: Aletha Halim, MD;  Location: [facility] ORS;  Service: Gynecology;  Laterality: Right;    Prior to Admission medications   Medication Sig Start Date End Date Taking? Authorizing Provider  albuterol (PROVENTIL) (2.5 MG/3ML) 0.083% nebulizer solution Take 3 mLs (2.5 mg total) by nebulization every 6 (six) hours as needed for wheezing or shortness of breath. 10/22/19   Hubbard Hartshorn, FNP  baclofen (LIORESAL) 10 MG tablet Take 1 tablet (10 mg total) by mouth 3 (three) times daily as needed for muscle spasms. 10/11/17   Trinna Post, PA-C  cetirizine (ZYRTEC) 10 MG tablet Take 10 mg by mouth daily.    [provider]  cyclobenzaprine (FLEXERIL) 10 MG tablet Take 1 tablet (10 mg total) by mouth 2 (two) times daily as needed for muscle spasms. 11/18/19   Sharion Balloon, NP  Galcanezumab-gnlm 120 MG/ML SOAJ Inject 120 mg into the skin every 30 (thirty) days.    [provider]  ibuprofen (ADVIL) 800 MG tablet Take 1 tablet (800 mg total) by mouth every 8 (eight) hours as needed. 11/18/19   Sharion Balloon, NP  ketorolac (TORADOL) 10 MG tablet Take 10 mg by mouth every 6 (six) hours as needed. 10/16/19   [provider]  levocetirizine (XYZAL) 5 MG tablet TAKE ONE-HALF TABLET BY  MOUTH IN THE EVENING 11/18/19   Hubbard Hartshorn, FNP  levothyroxine (SYNTHROID) 50 MCG tablet TAKE 1 TABLET BY MOUTH  DAILY 06/08/19   Mar Daring, PA-C  montelukast (SINGULAIR) 10 MG tablet TAKE 1 TABLET BY MOUTH  DAILY 07/16/19   Hubbard Hartshorn, FNP  nortriptyline (PAMELOR) 10 MG capsule Take 10 mg by mouth at bedtime.    [provider]  omeprazole (PRILOSEC) 20 MG capsule Take 20 mg by mouth  daily.    [provider]  ondansetron (ZOFRAN) 4 MG tablet  10/16/19   [provider]  pregabalin (LYRICA) 50 MG capsule  10/07/17   [provider]  PROAIR HFA 108 (90 Base) MCG/ACT inhaler USE 2 PUFFS BY MOUTH EVERY  6 HOURS AS NEEDED FOR  WHEEZING OR SHORTNESS OF  BREATH 02/01/19   Hubbard Hartshorn, FNP  promethazine (PHENERGAN) 12.5 MG tablet Take 12.5 mg by mouth. 10/16/19   [provider]  terbinafine (LAMISIL) 250 MG tablet Take 1 tablet (250 mg total) by mouth daily. Patient not taking: Reported on 10/20/2018 09/12/18   Edrick Kins, DPM    Allergies Aspirin and Erythromycin  Family History  Problem Relation Age of Onset  . Heart attack Father   . Arthritis Mother   . Hyperlipidemia Mother   . Thyroid disease Brother   . Breast cancer Neg Hx     Social History Social History   Tobacco Use  . Smoking status: Never Smoker  . Smokeless tobacco: Never Used  Substance Use Topics  . Alcohol use: Yes    Comment: occasional  . Drug use: No    Review of Systems Constitutional: No fever/chills Eyes: No visual changes. ENT: No sore throat. Cardiovascular: Denies chest pain. Respiratory: Denies shortness of breath. Gastrointestinal: No abdominal pain.  No nausea, no vomiting.  No diarrhea.   Genitourinary: Negative for dysuria. Musculoskeletal: Negative for back pain. Skin: Negative for rash. Neurological: Positive for headache, right sided forehead weakness, temple numbness ____________________________________________   PHYSICAL EXAM:  VITAL SIGNS: ED Triage Vitals  Enc Vitals Group     BP 04/08/20 1347 (!) 142/84     Pulse Rate 04/08/20 1347 74     Resp 04/08/20 1347 18     Temp 04/08/20 1347 98 F (36.7 C)     Temp Source 04/08/20 1347 Oral     SpO2 04/08/20 1347 100 %     Weight --      Height 04/08/20 1348 5\' 4"  (1.626 m)     Head Circumference --      Peak Flow --      Pain Score 04/08/20 1348 7   Constitutional:  Alert and oriented.  Eyes: Conjunctivae are normal.  ENT      Head: Normocephalic and atraumatic.      Nose: No congestion/rhinnorhea.      Mouth/Throat: Mucous membranes are moist.      Neck: No stridor. Hematological/Lymphatic/Immunilogical: No cervical lymphadenopathy. Cardiovascular: Normal rate, regular rhythm.  No murmurs, rubs, or gallops.  Respiratory: Normal respiratory effort without tachypnea nor retractions. Breath sounds are clear and equal bilaterally. No wheezes/rales/rhonchi. Gastrointestinal: Soft and non tender. No rebound. No guarding.  Genitourinary: Deferred Musculoskeletal: Normal range of motion in all extremities. No lower extremity edema. Neurologic:  Normal speech and language. EOMI. PERRL. Significant decrease in ability to raise right eyebrow. Tongue midline. Strength 5/5 in upper and lower extremities. Sensation grossly  intact. Skin:  Skin is warm, dry and intact. No rash noted. Psychiatric: Mood and affect are normal. Speech and behavior are normal. Patient exhibits appropriate insight and judgment.  ____________________________________________    LABS (pertinent positives/negatives)  UDS positive tricyclic Upreg negative UA hazy otherwise unremarkable Ethanol <10 CMP wnl except alb 5.1 CBC wbc 3.9, hgb 14.1, plt 190  ____________________________________________   EKG  I, Nance Pear, attending physician, personally viewed and interpreted this EKG  EKG Time: 1920 Rate: 63 Rhythm: normal sinus rhythm Axis: right axis  Intervals: qtc 397 QRS: narrow ST changes: no st elevation Impression: abnormal ekg  ____________________________________________    RADIOLOGY  CT head Normal non contrast ct head  ____________________________________________   PROCEDURES  Procedures  ____________________________________________   INITIAL IMPRESSION / ASSESSMENT AND PLAN / ED COURSE  Pertinent labs & imaging results that were available  during my care of the patient were reviewed by me and considered in my medical decision making (see chart for details).   Patient presented to the emergency department today because of concerns for right forehead weakness.  On exam she she does have significant weakness to her right forehead.  CT scan did not show any concerning findings.  I did have a discussion with the patient.  At this point I do think Bell's palsy likely.  Also discussed possibility of complicated migraine.  Patient's migraine has improved.  Did offer MRI however I have low suspicion for stroke and patient felt comfortable deferring.  We did discuss Bell's palsy care.  Discussed return precautions.  ____________________________________________   FINAL CLINICAL IMPRESSION(S) / ED DIAGNOSES  Final diagnoses:  Other migraine without status migrainosus, not intractable  Bell's palsy     Note: This dictation was prepared with Dragon dictation. Any transcriptional errors that result from this process are unintentional     Nance Pear, MD 04/08/20 1940

## 2020-04-08 NOTE — Discharge Instructions (Signed)
Please seek medical attention for any high fevers, chest pain, shortness of breath, change in behavior, persistent vomiting, bloody stool or any other new or concerning symptoms.  

## 2020-06-21 ENCOUNTER — Other Ambulatory Visit: Payer: Self-pay | Admitting: Physician Assistant

## 2020-06-21 DIAGNOSIS — E039 Hypothyroidism, unspecified: Secondary | ICD-10-CM

## 2020-06-21 NOTE — Telephone Encounter (Signed)
Requested Prescriptions  Pending Prescriptions Disp Refills  . levothyroxine (SYNTHROID) 50 MCG tablet [Pharmacy Med Name: Levothyroxine Sodium 50 MCG Oral Tablet] 90 tablet 1    Sig: TAKE 1 TABLET BY MOUTH  DAILY     Endocrinology:  Hypothyroid Agents Failed - 06/21/2020  9:23 PM      Failed - TSH needs to be rechecked within 3 months after an abnormal result. Refill until TSH is due.      Passed - TSH in normal range and within 360 days    TSH  Date Value Ref Range Status  11/03/2019 3.460 0.450 - 4.500 uIU/mL Final         Passed - Valid encounter within last 12 months    Recent Outpatient Visits          8 months ago Well woman exam (no gynecological exam)   [place], FNP   1 year ago Well woman exam   [place], FNP   1 year ago Migraine without status migrainosus, not intractable, unspecified migraine type   [place], FNP   2 years ago Other fatigue   [place], [place], [place]   2 years ago Viral URI with cough   [facility] Trinna Post, [place]      Future Appointments            In 1 week Brendolyn Patty, MD [place]   In 4 months Delsa Grana, PA-C [facility]

## 2020-06-22 ENCOUNTER — Telehealth: Payer: Self-pay

## 2020-06-22 NOTE — Telephone Encounter (Signed)
Pt states she will continue using OTC medications

## 2020-06-22 NOTE — Telephone Encounter (Signed)
Needs appt for refills

## 2020-06-24 ENCOUNTER — Telehealth: Payer: Self-pay

## 2020-06-24 NOTE — Telephone Encounter (Signed)
Pt needs appt for refills

## 2020-06-27 NOTE — Telephone Encounter (Signed)
lft vm to call to schedule appt with one of the other provider . Raquel Sarna gone

## 2020-06-28 ENCOUNTER — Other Ambulatory Visit: Payer: Self-pay

## 2020-06-28 ENCOUNTER — Ambulatory Visit (INDEPENDENT_AMBULATORY_CARE_PROVIDER_SITE_OTHER): Payer: Managed Care, Other (non HMO) | Admitting: Dermatology

## 2020-06-28 ENCOUNTER — Other Ambulatory Visit: Payer: Self-pay | Admitting: Dermatology

## 2020-06-28 DIAGNOSIS — D489 Neoplasm of uncertain behavior, unspecified: Secondary | ICD-10-CM

## 2020-06-28 DIAGNOSIS — L814 Other melanin hyperpigmentation: Secondary | ICD-10-CM

## 2020-06-28 DIAGNOSIS — D485 Neoplasm of uncertain behavior of skin: Secondary | ICD-10-CM

## 2020-06-28 DIAGNOSIS — B351 Tinea unguium: Secondary | ICD-10-CM | POA: Diagnosis not present

## 2020-06-28 NOTE — Patient Instructions (Addendum)

## 2020-06-28 NOTE — Progress Notes (Signed)
   Follow-Up Visit   Subjective  Brenda Price is a 49 y.o. female who presents for the following: Follow-up.  Patient presents today for follow up on OV 11/11/19 for Toe nail fungus, Using Jublia QD and also taking Diflucan 200 mg qw since January. Just took last dose last week. No side effects. Patient would also like to have a few areas of concern looked at on her left shoulder and right side of nose. Patient states that she does not have a skin cancer history.  The following portions of the chart were reviewed this encounter and updated as appropriate:      Review of Systems:  No other skin or systemic complaints except as noted in HPI or Assessment and Plan.  Objective  Well appearing patient in no apparent distress; mood and affect are within normal limits.  A focused examination was performed including Left shoulder, left side of eye, b/l feet. Relevant physical exam findings are noted in the Assessment and Plan.  Objective  toenails: Nail dystrophy with mild thickening and discoloration distally, much improved when compared to previous photos . Clear nail about 50% on each nail right Great, R 2nd and left 2nd.  Objective  Left anterior shoulder: 5 mm fleshy brown papule  Objective  Right Nasal Dorsum: 5 mm light brown macule   Assessment & Plan  Tinea unguium toenails  Much improved. 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  Epidermal / dermal shaving  Lesion diameter (cm):  0.5 Informed consent: discussed and consent obtained   Anesthesia: the lesion was anesthetized in a standard fashion   Anesthetic:  1% lidocaine w/ epinephrine 1-100,000 buffered w/ 8.4% NaHCO3 Instrument used: flexible razor blade   Hemostasis achieved with: pressure,  aluminum chloride and electrodesiccation   Outcome: patient tolerated procedure well   Post-procedure details: wound care instructions given   Post-procedure details comment:  Mupirocin ointment and bandage applied Additional details:  0.6 post treatment defect  Shave removal today Sent to [facility]  Irritated nevus vs other  Other Related Procedures Surgical pathology  Lentigines Right Nasal Dorsum  Benign-appearing.  Observation.  Call clinic for new or changing areas.  Recommend daily use of broad spectrum spf 30+ sunscreen to sun-exposed areas.     Return in about 5 months (around 11/28/2020) for TBSE.  I, Donzetta Kohut, CMA, am acting as scribe for Brendolyn Patty, MD .  Documentation: I have reviewed the above documentation for accuracy and completeness, and I agree with the above.  Brendolyn Patty MD

## 2020-07-02 LAB — ANATOMIC PATHOLOGY REPORT

## 2020-07-11 ENCOUNTER — Telehealth: Payer: Self-pay

## 2020-07-11 NOTE — Telephone Encounter (Signed)
Lft pt msg to call for bx results/sh °

## 2020-07-11 NOTE — Telephone Encounter (Signed)
-----   Message from Brendolyn Patty, MD sent at 07/11/2020  8:39 AM EDT ----- L anterior shoulder- compound nevus  Benign nevus

## 2020-07-13 ENCOUNTER — Telehealth: Payer: Self-pay

## 2020-07-13 NOTE — Telephone Encounter (Signed)
-----   Message from Brendolyn Patty, MD sent at 07/11/2020  8:39 AM EDT ----- L anterior shoulder- compound nevus  Benign nevus

## 2020-07-13 NOTE — Telephone Encounter (Signed)
Advised pt of bx results/sh ?

## 2020-08-15 ENCOUNTER — Other Ambulatory Visit: Payer: Self-pay

## 2020-08-15 ENCOUNTER — Encounter: Payer: Self-pay | Admitting: Family Medicine

## 2020-08-15 ENCOUNTER — Ambulatory Visit (INDEPENDENT_AMBULATORY_CARE_PROVIDER_SITE_OTHER): Payer: Managed Care, Other (non HMO) | Admitting: Family Medicine

## 2020-08-15 VITALS — BP 120/72 | HR 99 | Temp 98.3°F | Resp 14 | Ht 64.0 in | Wt 133.4 lb

## 2020-08-15 DIAGNOSIS — J302 Other seasonal allergic rhinitis: Secondary | ICD-10-CM | POA: Diagnosis not present

## 2020-08-15 DIAGNOSIS — J45909 Unspecified asthma, uncomplicated: Secondary | ICD-10-CM

## 2020-08-15 DIAGNOSIS — E039 Hypothyroidism, unspecified: Secondary | ICD-10-CM

## 2020-08-15 DIAGNOSIS — B029 Zoster without complications: Secondary | ICD-10-CM

## 2020-08-15 MED ORDER — VALACYCLOVIR HCL 1 G PO TABS: 1000.0000 mg | ORAL_TABLET | Freq: Three times a day (TID) | ORAL | 0 refills | Status: DC

## 2020-08-15 MED ORDER — ALBUTEROL SULFATE HFA 108 (90 BASE) MCG/ACT IN AERS: INHALATION_SPRAY | RESPIRATORY_TRACT | 3 refills | Status: DC

## 2020-08-15 MED ORDER — MONTELUKAST SODIUM 10 MG PO TABS: 10.0000 mg | ORAL_TABLET | Freq: Every day | ORAL | 3 refills | Status: DC

## 2020-08-15 NOTE — Progress Notes (Signed)
Patient ID: Brenda Price, female    DOB: 07/14/1971, 48 y.o.   MRN: [id number]  PCP: [name]  Chief Complaint  Patient presents with  . Rash    left side of body, red, painful, itching    Subjective:   Brenda Price is a 49 y.o. female, presents to clinic with CC of the following:  Rash This is a new problem. Episode onset: 3- 4 days ago. The problem has been gradually worsening since onset. Location: started to left side of neck wraps around neck to left trapezius and goes half way across upper left chest. The rash is characterized by blistering, itchiness, pain and redness. She was exposed to nothing. Associated symptoms include fatigue. Pertinent negatives include no anorexia, congestion, cough, diarrhea, eye pain, facial edema, fever, joint pain, nail changes, rhinorrhea, shortness of breath, sore throat or vomiting. Past treatments include topical steroids. The treatment provided mild relief.    Asthma - on singulair and albuterol rescue inhaler as needed, well controlled- needs refills  Hx of HA's/migraines managed by neurology - on nortriptyline and lyrica  Fibromyalgia - she has meds noted above and toradol po, ibuprofen, baclofen and flexeril prn  Her sx started last Thursday with neck and muscle pain, she took a baclofen, thought it was fibromyalgia flare - then she noticed a few scattered red raised bumps to back of neck, over the past couple days more rash developed, raised red, then blister fluid filled, then they pop and scale over.  Itching and burning  Patient Active Problem List   Diagnosis Date Noted  . Bilateral occipital neuralgia 08/21/2018  . 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 Dizziness 03/21/2016  . 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 Cardiac murmur 09/29/2015  . 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 Headache, migraine 09/29/2015  . 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 Cervical pain  09/29/2015  . Neutropenia ([place]) 09/29/2015  . Fungal infection of nail 09/29/2015  . Allergic rhinitis, seasonal 09/29/2015  . 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 Gastro-esophageal reflux disease without esophagitis 09/29/2015  . Asthma 08/29/2015      Current Outpatient Medications:  .  albuterol (PROVENTIL) (2.5 MG/3ML) 0.083% nebulizer solution, Take 3 mLs (2.5 mg total) by nebulization every 6 (six) hours as needed for wheezing or shortness of breath., Disp: 150 mL, Rfl: 1 .  baclofen (LIORESAL) 10 MG tablet, Take 1 tablet (10 mg total) by mouth 3 (three) times daily as needed for muscle spasms., Disp: 30 each, Rfl: 5 .  cetirizine (ZYRTEC) 10 MG tablet, Take 10 mg by mouth daily., Disp: , Rfl:  .  cyclobenzaprine (FLEXERIL) 10 MG tablet, Take 1 tablet (10 mg total) by mouth 2 (two) times daily as needed for muscle spasms., Disp: 20 tablet, Rfl: 0 .  Galcanezumab-gnlm 120 MG/ML SOAJ, Inject 120 mg into the skin every 30 (thirty) days., Disp: , Rfl:  .  ibuprofen (ADVIL) 800 MG tablet, Take 1 tablet (800 mg total) by mouth every 8 (eight) hours as needed., Disp: 21 tablet, Rfl: 0 .  ketorolac (TORADOL) 10 MG tablet, Take 10 mg by mouth every 6 (six) hours as needed., Disp: , Rfl:  .  levocetirizine (XYZAL) 5 MG tablet, TAKE ONE-HALF TABLET BY  MOUTH IN THE EVENING, Disp: 45 tablet, Rfl: 1 .  levothyroxine (SYNTHROID) 50 MCG tablet, TAKE 1 TABLET BY MOUTH  DAILY, Disp: 90 tablet, Rfl: 1 .  montelukast (SINGULAIR) 10 MG tablet,  TAKE 1 TABLET BY MOUTH  DAILY, Disp: 90 tablet, Rfl: 3 .  nortriptyline (PAMELOR) 10 MG capsule, Take 10 mg by mouth at bedtime., Disp: , Rfl:  .  omeprazole (PRILOSEC) 20 MG capsule, Take 20 mg by mouth daily., Disp: , Rfl:  .  ondansetron (ZOFRAN) 4 MG tablet, , Disp: , Rfl:  .  pregabalin (LYRICA) 50 MG capsule, , Disp: , Rfl:  .  PROAIR HFA 108 (90 Base) MCG/ACT inhaler, USE 2 PUFFS BY MOUTH EVERY  6 HOURS AS NEEDED FOR  WHEEZING OR SHORTNESS OF  BREATH,  Disp: 17 g, Rfl: 3 .  promethazine (PHENERGAN) 12.5 MG tablet, Take 12.5 mg by mouth., Disp: , Rfl:  .  terbinafine (LAMISIL) 250 MG tablet, Take 1 tablet (250 mg total) by mouth daily. (Patient not taking: Reported on 10/20/2018), Disp: 90 tablet, Rfl: 0   Allergies  Allergen Reactions  . Aspirin Other (See Comments)    Causes asthma to act up   . Erythromycin Other (See Comments)    Stomach cramping      Social History   Tobacco Use  . Smoking status: Never Smoker  . Smokeless tobacco: Never Used  Vaping Use  . Vaping Use: Never used  Substance Use Topics  . Alcohol use: Yes    Comment: occasional  . Drug use: No      Chart Review Today: I personally reviewed active problem list, medication list, allergies, family history, social history, health maintenance, notes from last encounter, lab results, imaging with the patient/caregiver today.   Review of Systems  Constitutional: Positive for fatigue. Negative for fever.  HENT: Negative.  Negative for congestion, rhinorrhea and sore throat.   Eyes: Negative.  Negative for pain.  Respiratory: Negative.  Negative for cough and shortness of breath.   Cardiovascular: Negative.   Gastrointestinal: Negative.  Negative for anorexia, diarrhea and vomiting.  Endocrine: Negative.   Genitourinary: Negative.   Musculoskeletal: Negative.  Negative for joint pain.  Skin: Positive for rash. Negative for nail changes.  Allergic/Immunologic: Negative.   Neurological: Negative.   Hematological: Negative.   Psychiatric/Behavioral: Negative.   All other systems reviewed and are negative.      Objective:   Vitals:   08/15/20 0950  BP: 120/72  Pulse: 99  Resp: 14  Temp: 98.3 F (36.8 C)  TempSrc: Oral  SpO2: 100%  Weight: 133 lb 6.4 oz (60.5 kg)  Height: 5\' 4"  (1.626 m)    Body mass index is 22.9 kg/m.  Physical Exam Vitals and nursing note reviewed.  Constitutional:      Appearance: She is well-developed.  HENT:      Head: Normocephalic and atraumatic.     Nose: Nose normal.  Eyes:     General:        Right eye: No discharge.        Left eye: No discharge.     Conjunctiva/sclera: Conjunctivae normal.  Neck:     Trachea: No tracheal deviation.  Cardiovascular:     Rate and Rhythm: Normal rate and regular rhythm.     Pulses: Normal pulses.     Heart sounds: Normal heart sounds.  Pulmonary:     Effort: Pulmonary effort is normal. No respiratory distress.     Breath sounds: Normal breath sounds. No stridor. No wheezing, rhonchi or rales.  Musculoskeletal:        General: Normal range of motion.  Skin:    General: Skin is warm and dry.  Findings: Rash present.     Comments: Rash from left posterior neck from hairline, down neck, wrapping around left shoulder and to left anterior upper chest - clusters of erythematous papules, pustules and some scaled and scabbed over clusters, not crossing midline anteriorly or posteriorly, ttp, no surrounding edema, induration, erythema, no purulent discharge  Neurological:     Mental Status: She is alert.     Motor: No abnormal muscle tone.     Coordination: Coordination normal.  Psychiatric:        Behavior: Behavior normal.      Results for orders placed or performed in visit on 06/28/20  Anatomic Pathology Report  Result Value Ref Range   Diagnosis synopsis: Comment    Specimen: Comment    Clinical history: Comment    Diagnosis: Comment    Microscopic description: Comment    Gross description: Comment    Electronically signed by: Comment    CPT code(s): Comment    CPT Disclaimer: Comment    Clinician provided ICD: D48.9    Pathologist provided ICD: D22.62        Assessment & Plan:   1. Herpes zoster without complication Onset of sx 3-4 days ago with rash following, unilateral, following dermatome, not crossing midline, discussed shingles tx, duration, self limiting, avoiding secondary bacterial infections.  Her pain seems well controlled with  fibromyalgia meds and OTC meds Reviewed valtrex and SE, f/up if any worsening - valACYclovir (VALTREX) 1000 MG tablet; Take 1 tablet (1,000 mg total) by mouth 3 (three) times daily.  Dispense: 21 tablet; Refill: 0  2. Allergic rhinitis, seasonal Refill on meds - montelukast (SINGULAIR) 10 MG tablet; Take 1 tablet (10 mg total) by mouth daily.  Dispense: 90 tablet; Refill: 3  3. Asthma, unspecified asthma severity, unspecified whether complicated, unspecified whether persistent Well controlled, PCP moved out of area, refill on her medications, lungs clear today - montelukast (SINGULAIR) 10 MG tablet; Take 1 tablet (10 mg total) by mouth daily.  Dispense: 90 tablet; Refill: 3 - albuterol (PROAIR HFA) 108 (90 Base) MCG/ACT inhaler; USE 2 PUFFS BY MOUTH EVERY  6 HOURS AS NEEDED FOR  WHEEZING OR SHORTNESS OF  BREATH  Dispense: 17 g; Refill: 3  4. Adult hypothyroidism She got refill on levothyroxine 1-2 months ago, was overdue for routine f/up, TSH labs to recheck, she is fatigued currently - will check labs and adjust meds as needed, she has a CPE coming up in 2 months  - TSH      Delsa Grana, PA-C 08/15/20 9:56 AM

## 2020-08-15 NOTE — Patient Instructions (Signed)
Shingles  Shingles is an infection. It gives you a painful skin rash and blisters that have fluid in them. Shingles is caused by the same germ (virus) that causes chickenpox. Shingles only happens in people who:  Have had chickenpox.  Have been given a shot of medicine (vaccine) to protect against chickenpox. Shingles is rare in this group. The first symptoms of shingles may be itching, tingling, or pain in an area on your skin. A rash will show on your skin a few days or weeks later. The rash is likely to be on one side of your body. The rash usually has a shape like a belt or a band. Over time, the rash turns into fluid-filled blisters. The blisters will break open, change into scabs, and dry up. Medicines may:  Help with pain and itching.  Help you get better sooner.  Help to prevent long-term problems. Follow these instructions at home: Medicines  Take over-the-counter and prescription medicines only as told by your doctor.  Put on an anti-itch cream or numbing cream where you have a rash, blisters, or scabs. Do this as told by your doctor. Helping with itching and discomfort   Put cold, wet cloths (cold compresses) on the area of the rash or blisters as told by your doctor.  Cool baths can help you feel better. Try adding baking soda or dry oatmeal to the water to lessen itching. Do not bathe in hot water. Blister and rash care  Keep your rash covered with a loose bandage (dressing).  Wear loose clothing that does not rub on your rash.  Keep your rash and blisters clean. To do this, wash the area with mild soap and cool water as told by your doctor.  Check your rash every day for signs of infection. Check for: ? More redness, swelling, or pain. ? Fluid or blood. ? Warmth. ? Pus or a bad smell.  Do not scratch your rash. Do not pick at your blisters. To help you to not scratch: ? Keep your fingernails clean and cut short. ? Wear gloves or mittens when you sleep, if  scratching is a problem. General instructions  Rest as told by your doctor.  Keep all follow-up visits as told by your doctor. This is important.  Wash your hands often with soap and water. If soap and water are not available, use hand sanitizer. Doing this lowers your chance of getting a skin infection caused by germs (bacteria).  Your infection can cause chickenpox in people who have never had chickenpox or never got a shot of chickenpox vaccine. If you have blisters that did not change into scabs yet, try not to touch other people or be around other people, especially: ? Babies. ? Pregnant women. ? Children who have areas of red, itchy, or rough skin (eczema). ? Very old people who have transplants. ? People who have a long-term (chronic) sickness, like cancer or AIDS. Contact a doctor if:  Your pain does not get better with medicine.  Your pain does not get better after the rash heals.  You have any signs of infection in the rash area. These signs include: ? More redness, swelling, or pain around the rash. ? Fluid or blood coming from the rash. ? The rash area feeling warm to the touch. ? Pus or a bad smell coming from the rash. Get help right away if:  The rash is on your face or nose.  You have pain in your face or pain by   your eye.  You lose feeling on one side of your face.  You have trouble seeing.  You have ear pain, or you have ringing in your ear.  You have a loss of taste.  Your condition gets worse. Summary  Shingles gives you a painful skin rash and blisters that have fluid in them.  Shingles is an infection. It is caused by the same germ (virus) that causes chickenpox.  Keep your rash covered with a loose bandage (dressing). Wear loose clothing that does not rub on your rash.  If you have blisters that did not change into scabs yet, try not to touch other people or be around people. This information is not intended to replace advice given to you by  your health care provider. Make sure you discuss any questions you have with your health care provider. Document Revised: 02/20/2019 Document Reviewed: 07/03/2017 Elsevier Patient Education  2020 [employer]  

## 2020-08-18 ENCOUNTER — Ambulatory Visit: Payer: Self-pay | Admitting: Family Medicine

## 2020-10-25 ENCOUNTER — Encounter: Payer: Managed Care, Other (non HMO) | Admitting: Family Medicine

## 2020-10-27 ENCOUNTER — Encounter: Payer: Managed Care, Other (non HMO) | Admitting: Family Medicine

## 2020-10-28 ENCOUNTER — Encounter: Payer: Self-pay | Admitting: Family Medicine

## 2020-10-28 ENCOUNTER — Ambulatory Visit (INDEPENDENT_AMBULATORY_CARE_PROVIDER_SITE_OTHER): Payer: Managed Care, Other (non HMO) | Admitting: Family Medicine

## 2020-10-28 ENCOUNTER — Other Ambulatory Visit: Payer: Self-pay

## 2020-10-28 VITALS — BP 120/80 | HR 93 | Temp 98.2°F | Resp 16 | Ht 64.0 in | Wt 134.1 lb

## 2020-10-28 DIAGNOSIS — Z1321 Encounter for screening for nutritional disorder: Secondary | ICD-10-CM

## 2020-10-28 DIAGNOSIS — Z1231 Encounter for screening mammogram for malignant neoplasm of breast: Secondary | ICD-10-CM | POA: Diagnosis not present

## 2020-10-28 DIAGNOSIS — J302 Other seasonal allergic rhinitis: Secondary | ICD-10-CM

## 2020-10-28 DIAGNOSIS — L309 Dermatitis, unspecified: Secondary | ICD-10-CM

## 2020-10-28 DIAGNOSIS — E039 Hypothyroidism, unspecified: Secondary | ICD-10-CM

## 2020-10-28 DIAGNOSIS — J45909 Unspecified asthma, uncomplicated: Secondary | ICD-10-CM

## 2020-10-28 DIAGNOSIS — Z13220 Encounter for screening for lipoid disorders: Secondary | ICD-10-CM

## 2020-10-28 DIAGNOSIS — Z0000 Encounter for general adult medical examination without abnormal findings: Secondary | ICD-10-CM | POA: Diagnosis not present

## 2020-10-28 DIAGNOSIS — Z130 Encounter for screening for diseases of the blood and blood-forming organs and certain disorders involving the immune mechanism: Secondary | ICD-10-CM

## 2020-10-28 DIAGNOSIS — Z13228 Encounter for screening for other metabolic disorders: Secondary | ICD-10-CM

## 2020-10-28 DIAGNOSIS — K219 Gastro-esophageal reflux disease without esophagitis: Secondary | ICD-10-CM

## 2020-10-28 DIAGNOSIS — Z1329 Encounter for screening for other suspected endocrine disorder: Secondary | ICD-10-CM

## 2020-10-28 DIAGNOSIS — M797 Fibromyalgia: Secondary | ICD-10-CM

## 2020-10-28 MED ORDER — LEVOCETIRIZINE DIHYDROCHLORIDE 5 MG PO TABS: ORAL_TABLET | ORAL | 3 refills | Status: DC

## 2020-10-28 MED ORDER — TRIAMCINOLONE ACETONIDE 0.1 % EX OINT: 1.0000 "application " | TOPICAL_OINTMENT | Freq: Two times a day (BID) | CUTANEOUS | 2 refills | Status: DC

## 2020-10-28 NOTE — Patient Instructions (Addendum)
Health Maintenance  Topic Date Due  . COVID-19 Vaccine (3 - Pfizer risk 4-dose series) 03/22/2020  . Mammogram  01/24/2021  . Pap Smear  10/21/2021  . Tetanus Vaccine  07/07/2022  . Flu Shot  Completed  .  Hepatitis C: One time screening is recommended by Center for Disease Control  (CDC) for  adults born from [birth year] through 1964.   Completed  . HIV Screening  Completed   [facility] [place],  [place]  [id number] Get Driving Directions Main: [phone]     Dyshidrotic Eczema Dyshidrotic eczema (pompholyx) is a type of eczema that causes very itchy (pruritic), fluid-filled blisters (vesicles) to form on the hands and feet. It can affect people of any age, but is more common before the age of 38. There is no cure, but treatment and certain lifestyle changes can help relieve symptoms. What are the causes? The cause of this condition is not known. What increases the risk? You are more likely to develop this condition if:  You wash your hands frequently.  You have a personal history or family history of eczema, allergies, asthma, or hay fever.  You are allergic to metals such as nickel or cobalt.  You work with cement.  You smoke. What are the signs or symptoms? Symptoms of this condition may affect the hands, feet, or both. Symptoms may come and go (recur), and may include:  Severe itching, which may happen before blisters appear.  Blisters. These may form suddenly. ? In the early stages, blisters may form near the fingertips. ? In severe cases, blisters may grow to large blister masses (bullae). ? Blisters resolve in 2-3 weeks without bursting. This is followed by a dry phase in which itching eases.  Pain and swelling.  Cracks or long, narrow openings (fissures) in the skin.  Severe dryness.  Ridges on the nails. How is this diagnosed? This condition may be diagnosed based on:  A physical exam.  Your  symptoms.  Your medical history.  Skin scrapings to rule out a fungal infection.  Testing a swab of fluid for bacteria (culture).  Removing and checking a small piece of skin (biopsy) in order to test for infection or to rule out other conditions.  Skin patch tests. These tests involve taking patches that contain possible allergens and placing them on your back. Your health care provider will wait a few days and then check to see if an allergic reaction occurred. These tests may be done if your health care provider suspects allergic reactions, or to rule out other types of eczema. You may be referred to a health care provider who specializes in the skin (dermatologist) to help diagnose and treat this condition. How is this treated? There is no cure for this condition, but treatment can help relieve symptoms. Depending on how many blisters you have and how severe they are, your health care provider may suggest:  Avoiding allergens, irritants, or triggers that worsen symptoms. This may involve lifestyle changes such as: ? Using different lotions or soaps. ? Avoiding hot weather or places that will cause you to sweat a lot. ? Managing stress with coping techniques such as relaxation and exercise, and asking for help when you need it. ? Diet changes as recommended by your health care provider.  Using a clean, damp towel (cool compress) to relieve symptoms.  Soaking in a bath that contains a type of salt that relieves irritation (aluminum acetate soaks).  Medicine taken by mouth to reduce itching (oral antihistamines).  Medicine applied to the skin to reduce swelling and irritation (topical corticosteroids).  Medicine that reduces the activity of the body's disease-fighting system (immunosuppressants) to treat inflammation. This may be given in severe cases.  Antibiotic medicines to treat bacterial infection.  Light therapy (phototherapy). This involves shining ultraviolet (UV) light on  affected skin in order to reduce itchiness and inflammation. Follow these instructions at home: Bathing and skin care   Wash skin gently. After bathing or washing your hands, pat your skin dry. Avoid rubbing your skin.  Remove all jewelry before bathing. If the skin under the jewelry stays wet, blisters may form or get worse.  Apply cool compresses as told by your health care provider: ? Soak a clean towel in cool water. ? Wring out excess water until towel is damp. ? Place the towel over affected skin. Leave the towel on for 20 minutes at a time, 2-3 times a day.  Use mild soaps, cleansers, and lotions that do not contain dyes, perfumes, or other irritants.  Keep your skin hydrated. To do this: ? Avoid very hot water. Take lukewarm baths or showers. ? Apply moisturizer within three minutes of bathing. This locks in moisture. Medicines  Take and apply over-the-counter and prescription medicines only as told by your health care provider.  If you were prescribed antibiotic medicine, take or apply it as told by your health care provider. Do not stop using the antibiotic even if you start to feel better. General instructions  Identify and avoid triggers and allergens.  Keep fingernails short to avoid breaking open the skin while scratching.  Use waterproof gloves to protect your hands when doing work that keeps your hands wet for a long time.  Wear socks to keep your feet dry.  Do not use any products that contain nicotine or tobacco, such as cigarettes and e-cigarettes. If you need help quitting, ask your health care provider.  Keep all follow-up visits as told by your health care provider. This is important. Contact a health care provider if:  You have symptoms that do not go away.  You have signs of infection, such as: ? Crusting, pus, or a bad smell. ? More redness, swelling, or pain. ? Increased warmth in the affected area. Summary  Dyshidrotic eczema (pompholyx) is a  type of eczema that causes very itchy (pruritic), fluid-filled blisters (vesicles) to form on the hands and feet.  The cause of this condition is not known.  There is no cure for this condition, but treatment can help relieve symptoms. Treatment depends on how many blisters you have and how severe they are.  Use mild soaps, cleansers, and lotions that do not contain dyes, perfumes, or other irritants. Keep your skin hydrated. This information is not intended to replace advice given to you by your health care provider. Make sure you discuss any questions you have with your health care provider. Document Revised: 02/18/2019 Document Reviewed: 03/14/2017 Elsevier Patient Education  2020 [employer] Inc.    Preventive Care 30-9 Years Old, Female Preventive care refers to visits with your health care provider and lifestyle choices that can promote health and wellness. This includes:  A yearly physical exam. This may also be called an annual well check.  Regular dental visits and eye exams.  Immunizations.  Screening for certain conditions.  Healthy lifestyle choices, such as eating a healthy diet, getting regular exercise, not using drugs or products that contain nicotine and  tobacco, and limiting alcohol use. What can I expect for my preventive care visit? Physical exam Your health care provider will check your:  Height and weight. This may be used to calculate body mass index (BMI), which tells if you are at a healthy weight.  Heart rate and blood pressure.  Skin for abnormal spots. Counseling Your health care provider may ask you questions about your:  Alcohol, tobacco, and drug use.  Emotional well-being.  Home and relationship well-being.  Sexual activity.  Eating habits.  Work and work Statistician.  Method of birth control.  Menstrual cycle.  Pregnancy history. What immunizations do I need?  Influenza (flu) vaccine  This is recommended every year. Tetanus,  diphtheria, and pertussis (Tdap) vaccine  You may need a Td booster every 10 years. Varicella (chickenpox) vaccine  You may need this if you have not been vaccinated. Zoster (shingles) vaccine  You may need this after age 64. Measles, mumps, and rubella (MMR) vaccine  You may need at least one dose of MMR if you were born in 1957 or later. You may also need a second dose. Pneumococcal conjugate (PCV13) vaccine  You may need this if you have certain conditions and were not previously vaccinated. Pneumococcal polysaccharide (PPSV23) vaccine  You may need one or two doses if you smoke cigarettes or if you have certain conditions. Meningococcal conjugate (MenACWY) vaccine  You may need this if you have certain conditions. Hepatitis A vaccine  You may need this if you have certain conditions or if you travel or work in places where you may be exposed to hepatitis A. Hepatitis B vaccine  You may need this if you have certain conditions or if you travel or work in places where you may be exposed to hepatitis B. Haemophilus influenzae type b (Hib) vaccine  You may need this if you have certain conditions. Human papillomavirus (HPV) vaccine  If recommended by your health care provider, you may need three doses over 6 months. You may receive vaccines as individual doses or as more than one vaccine together in one shot (combination vaccines). Talk with your health care provider about the risks and benefits of combination vaccines. What tests do I need? Blood tests  Lipid and cholesterol levels. These may be checked every 5 years, or more frequently if you are over 77 years old.  Hepatitis C test.  Hepatitis B test. Screening  Lung cancer screening. You may have this screening every year starting at age 56 if you have a 30-pack-year history of smoking and currently smoke or have quit within the past 15 years.  Colorectal cancer screening. All adults should have this screening  starting at age 28 and continuing until age 52. Your health care provider may recommend screening at age 61 if you are at increased risk. You will have tests every 1-10 years, depending on your results and the type of screening test.  Diabetes screening. This is done by checking your blood sugar (glucose) after you have not eaten for a while (fasting). You may have this done every 1-3 years.  Mammogram. This may be done every 1-2 years. Talk with your health care provider about when you should start having regular mammograms. This may depend on whether you have a family history of breast cancer.  BRCA-related cancer screening. This may be done if you have a family history of breast, ovarian, tubal, or peritoneal cancers.  Pelvic exam and Pap test. This may be done every 3 years starting at  age 72. Starting at age 9, this may be done every 5 years if you have a Pap test in combination with an HPV test. Other tests  Sexually transmitted disease (STD) testing.  Bone density scan. This is done to screen for osteoporosis. You may have this scan if you are at high risk for osteoporosis. Follow these instructions at home: Eating and drinking  Eat a diet that includes fresh fruits and vegetables, whole grains, lean protein, and low-fat dairy.  Take vitamin and mineral supplements as recommended by your health care provider.  Do not drink alcohol if: ? Your health care provider tells you not to drink. ? You are pregnant, may be pregnant, or are planning to become pregnant.  If you drink alcohol: ? Limit how much you have to 0-1 drink a day. ? Be aware of how much alcohol is in your drink. In the [place] one drink equals one 12 oz bottle of beer (355 mL), one 5 oz glass of wine (148 mL), or one 1 oz glass of hard liquor (44 mL). Lifestyle  Take daily care of your teeth and gums.  Stay active. Exercise for at least 30 minutes on 5 or more days each week.  Do not use any products that contain  nicotine or tobacco, such as cigarettes, e-cigarettes, and chewing tobacco. If you need help quitting, ask your health care provider.  If you are sexually active, practice safe sex. Use a condom or other form of birth control (contraception) in order to prevent pregnancy and STIs (sexually transmitted infections).  If told by your health care provider, take low-dose aspirin daily starting at age 38. What's next?  Visit your health care provider once a year for a well check visit.  Ask your health care provider how often you should have your eyes and teeth checked.  Stay up to date on all vaccines. This information is not intended to replace advice given to you by your health care provider. Make sure you discuss any questions you have with your health care provider. Document Revised: 07/10/2018 Document Reviewed: 07/10/2018 Elsevier Patient Education  2020 [employer].

## 2020-10-28 NOTE — Progress Notes (Signed)
Patient: Brenda Price, Female    DOB: [birth year], 49 y.o.   MRN: [id number] [place] Visit Date: 10/28/2020  Today's Provider: Delsa Grana, PA-C   Chief Complaint  Patient presents with  . Annual Exam   Subjective:   Annual physical exam:  Brenda Price is a 49 y.o. female who presents today for complete physical exam:  Exercise/Activity: daily at least a 10 min walk Diet/nutrition:overall healthy Sleep: fairly well  Migraine/headache syndrome managed by [facility] neurology on injectable migraine medication, phenergan zofran ketorolac prn and nortriptyline Fibromyalgia - lyrica, baclofen she continues to deal with a lot of fatigue with fibromyalgia syndrome and migraines also with increased work stress and holidays  Allergies, asthma and eczema -rash burning and cracked skin to her hands is severe and worse during winter months She has seasonal allergies she manages this with Xyzal half a tablet at bedtime and usually another antihistamine in the morning also is on Singulair and has albuterol rescue inhaler.  The changing weather does trigger more chest tightness shortness of breath and wheeze but she has not felt like she needed steroids lately.  Recently had shingles has improved from this  GERD on Prilosec 20 mg daily, also is prone to constipation has several GI diagnoses, has not been to follow-up with GI in a while she was waiting till Covid come down she would still like to wait until next year for any referrals for colon cancer screening or returning to GI for follow-up, no change in bowels no melena, hematochezia, she manages constipation with over-the-counter stool softeners   Pt wished to discuss acute complaints and do routine f/up on chronic conditions today in addition to CPE. Advised pt of separate visit billing/coding Asked to come back in 6 months for routine follow-up and then 12 months from now for CPE  USPSTF grade A and B  recommendations - reviewed and addressed today  Depression:  Phq 9 completed today by patient, was reviewed by me with patient in the room PHQ score is negative, pt feels good but a little fatigued and stressed PHQ 2/9 Scores 10/28/2020 08/15/2020 10/22/2019 10/20/2018  PHQ - 2 Score 0 0 0 0  PHQ- 9 Score - - 0 0   Depression screen [facility] 2/9 10/28/2020 08/15/2020 10/22/2019 10/20/2018 08/21/2018  Decreased Interest 0 0 0 0 0  Down, Depressed, Hopeless 0 0 0 0 0  PHQ - 2 Score 0 0 0 0 0  Altered sleeping - - 0 0 0  Tired, decreased energy - - 0 0 1  Change in appetite - - 0 0 0  Feeling bad or failure about yourself  - - 0 0 0  Trouble concentrating - - 0 0 0  Moving slowly or fidgety/restless - - 0 0 0  Suicidal thoughts - - 0 0 0  PHQ-9 Score - - 0 0 -  Difficult doing work/chores - - Not difficult at all Not difficult at all Not difficult at all    Alcohol screening: [place] Office Visit from 10/28/2020 in [facility]  AUDIT-C Score 0      Immunizations and Health Maintenance: Health Maintenance  Topic Date Due  . COVID-19 Vaccine (3 - Pfizer risk 4-dose series) 03/22/2020  . MAMMOGRAM  01/24/2021  . PAP SMEAR-Modifier  10/21/2021  . TETANUS/TDAP  07/07/2022  . INFLUENZA VACCINE  Completed  . Hepatitis C Screening  Completed  . HIV Screening  Completed  Hep C Screening: Done  STD testing and prevention (HIV/chl/gon/syphilis):  see above, no additional testing desired by pt today -monogamous no exposures no desire for testing  Intimate partner violence: Feels safe  Sexual History/Pain during Intercourse: Married -no concerns or symptoms  Menstrual History/LMP/Abnormal Bleeding: No abnormal uterine bleeding, no reported sexual issues, improved stress incontinence after hysterectomy No LMP recorded. Patient has had a hysterectomy.  Incontinence Symptoms: Better see above, doing Kegels  Breast cancer: Up-to-date, due in March Last  Mammogram: *see HM list above BRCA gene screening: None known  Cervical cancer screening: Up-to-date   Osteoporosis:   Discussion on osteoporosis per age, including high calcium and vitamin D supplementation, weight bearing exercises Pt is supplementing with daily calcium/Vit D.  Skin cancer:  Hx of skin CA -  NO Discussed atypical lesions   Colorectal cancer:   Colonoscopy is due per age -will let us know when she is ready to do GI follow-up Discussed concerning signs and sx of CRC, pt denies melena, hematochezia, change in bowels Lung cancer:   Low Dose CT Chest recommended if Age 58-80 years, 30 pack-year currently smoking OR have quit w/in 15years. Patient does not qualify.    Social History   Tobacco Use  . Smoking status: Never Smoker  . Smokeless tobacco: Never Used  Vaping Use  . Vaping Use: Never used  Substance Use Topics  . Alcohol use: Yes    Comment: occasional  . Drug use: No     Flowsheet Row Office Visit from 10/28/2020 in [facility]  AUDIT-C Score 0      Family History  Problem Relation Age of Onset  . Heart attack Father   . Arthritis Mother   . Hyperlipidemia Mother   . Thyroid disease Brother   . Breast cancer Neg Hx      Blood pressure/Hypertension: BP Readings from Last 3 Encounters:  10/28/20 120/80  08/15/20 120/72  04/08/20 132/89    Weight/Obesity: Wt Readings from Last 3 Encounters:  10/28/20 134 lb 1.6 oz (60.8 kg)  08/15/20 133 lb 6.4 oz (60.5 kg)  10/22/19 133 lb 11.2 oz (60.6 kg)   BMI Readings from Last 3 Encounters:  10/28/20 23.02 kg/m  08/15/20 22.90 kg/m  04/08/20 22.95 kg/m     Lipids:  Lab Results  Component Value Date   CHOL 195 11/03/2019   CHOL 222 (H) 09/05/2018   CHOL 193 10/31/2017   Lab Results  Component Value Date   HDL 79 11/03/2019   HDL 78 09/05/2018   HDL 64 10/31/2017   Lab Results  Component Value Date   LDLCALC 105 (H) 11/03/2019   LDLCALC 131 (H) 09/05/2018    LDLCALC 112 (H) 10/31/2017   Lab Results  Component Value Date   TRIG 59 11/03/2019   TRIG 63 09/05/2018   TRIG 83 10/31/2017   Lab Results  Component Value Date   CHOLHDL 2.5 11/03/2019   CHOLHDL 2.8 09/05/2018   CHOLHDL 3.0 10/31/2017   No results found for: LDLDIRECT Based on the results of lipid panel his/her cardiovascular risk factor ( using [place] )  in the next 10 years is: The 10-year ASCVD risk score Mikey Bussing DC Jr., et al., 2013) is: 0.5%   Values used to calculate the score:     Age: 19 years     Sex: Female     Is Non-Hispanic African American: No     Diabetic: No     Tobacco smoker: No  Systolic Blood Pressure: 161 mmHg     Is BP treated: No     HDL Cholesterol: 79 mg/dL     Total Cholesterol: 195 mg/dL Glucose:  Glucose  Date Value Ref Range Status  11/03/2019 83 65 - 99 mg/dL Final  09/05/2018 72 65 - 99 mg/dL Final  03/14/2018 92 65 - 99 mg/dL Final   Glucose, Bld  Date Value Ref Range Status  04/08/2020 97 70 - 99 mg/dL Final    Comment:    Glucose reference range applies only to samples taken after fasting for at least 8 hours.  03/03/2016 126 (H) 65 - 99 mg/dL Final   Hypertension: BP Readings from Last 3 Encounters:  10/28/20 120/80  08/15/20 120/72  04/08/20 132/89   Obesity: Wt Readings from Last 3 Encounters:  10/28/20 134 lb 1.6 oz (60.8 kg)  08/15/20 133 lb 6.4 oz (60.5 kg)  10/22/19 133 lb 11.2 oz (60.6 kg)   BMI Readings from Last 3 Encounters:  10/28/20 23.02 kg/m  08/15/20 22.90 kg/m  04/08/20 22.95 kg/m      Advanced Care Planning:  A voluntary discussion about advance care planning including the explanation and discussion of advance directives.     Social History      She        Social History   Socioeconomic History  . Marital status: Married    Spouse name: Gerald Stabs  . Number of children: 2  . Years of education: Not on file  . Highest education level: Not on file  Occupational History  . Not on  file  Tobacco Use  . Smoking status: Never Smoker  . Smokeless tobacco: Never Used  Vaping Use  . Vaping Use: Never used  Substance and Sexual Activity  . Alcohol use: Yes    Comment: occasional  . Drug use: No  . Sexual activity: Yes    Partners: Female  Other Topics Concern  . Not on file  Social History Narrative  . Not on file   Social Determinants of Health   Financial Resource Strain: Low Risk   . Difficulty of Paying Living Expenses: Not hard at all  Food Insecurity: No Food Insecurity  . Worried About Charity fundraiser in the Last Year: Never true  . Ran Out of Food in the Last Year: Never true  Transportation Needs: No Transportation Needs  . Lack of Transportation (Medical): No  . Lack of Transportation (Non-Medical): No  Physical Activity: Insufficiently Active  . Days of Exercise per Week: 7 days  . Minutes of Exercise per Session: 10 min  Stress: Stress Concern Present  . Feeling of Stress : To some extent  Social Connections: Moderately Isolated  . Frequency of Communication with Friends and Family: Once a week  . Frequency of Social Gatherings with Friends and Family: Once a week  . Attends Religious Services: 1 to 4 times per year  . Active Member of Clubs or Organizations: No  . Attends Archivist Meetings: Patient refused  . Marital Status: Married    Family History        Family History  Problem Relation Age of Onset  . Heart attack Father   . Arthritis Mother   . Hyperlipidemia Mother   . Thyroid disease Brother   . Breast cancer Neg Hx     Patient Active Problem List   Diagnosis Date Noted  . Bilateral occipital neuralgia 08/21/2018  . 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 Dizziness 03/21/2016  .  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 Cardiac murmur 09/29/2015  . 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 Headache, migraine 09/29/2015  . 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 Cervical pain 09/29/2015   . Neutropenia ([place]) 09/29/2015  . Fungal infection of nail 09/29/2015  . Allergic rhinitis, seasonal 09/29/2015  . 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 Gastro-esophageal reflux disease without esophagitis 09/29/2015  . Asthma 08/29/2015    Past Surgical History:  Procedure Laterality Date  . ABDOMINAL HYSTERECTOMY    . CESAREAN SECTION    . KNEE ARTHROSCOPY    . LAPAROSCOPIC TOTAL HYSTERECTOMY     with BS. Dr. Norton Blizzard [facility]  . LAPAROSCOPY Right 03/03/2016   Procedure:  diagnostic laparoscopy and right oophrectomy;  Surgeon: Aletha Halim, MD;  Location: [facility] ORS;  Service: Gynecology;  Laterality: Right;     Current Outpatient Medications:  .  albuterol (PROAIR HFA) 108 (90 Base) MCG/ACT inhaler, USE 2 PUFFS BY MOUTH EVERY  6 HOURS AS NEEDED FOR  WHEEZING OR SHORTNESS OF  BREATH, Disp: 17 g, Rfl: 3 .  albuterol (PROVENTIL) (2.5 MG/3ML) 0.083% nebulizer solution, Take 3 mLs (2.5 mg total) by nebulization every 6 (six) hours as needed for wheezing or shortness of breath., Disp: 150 mL, Rfl: 1 .  baclofen (LIORESAL) 10 MG tablet, Take 1 tablet (10 mg total) by mouth 3 (three) times daily as needed for muscle spasms., Disp: 30 each, Rfl: 5 .  cetirizine (ZYRTEC) 10 MG tablet, Take 10 mg by mouth daily., Disp: , Rfl:  .  Galcanezumab-gnlm 120 MG/ML SOAJ, Inject 120 mg into the skin every 30 (thirty) days., Disp: , Rfl:  .  ibuprofen (ADVIL) 800 MG tablet, Take 1 tablet (800 mg total) by mouth every 8 (eight) hours as needed., Disp: 21 tablet, Rfl: 0 .  ketorolac (TORADOL) 10 MG tablet, Take 10 mg by mouth every 6 (six) hours as needed., Disp: , Rfl:  .  levocetirizine (XYZAL) 5 MG tablet, TAKE ONE-HALF TABLET BY  MOUTH IN THE EVENING, Disp: 45 tablet, Rfl: 1 .  levothyroxine (SYNTHROID) 50 MCG tablet, TAKE 1 TABLET BY MOUTH  DAILY, Disp: 90 tablet, Rfl: 1 .  montelukast (SINGULAIR) 10 MG tablet, Take 1 tablet (10 mg total) by mouth daily., Disp: 90 tablet, Rfl: 3 .   nortriptyline (PAMELOR) 10 MG capsule, Take 10 mg by mouth at bedtime., Disp: , Rfl:  .  omeprazole (PRILOSEC) 20 MG capsule, Take 20 mg by mouth daily., Disp: , Rfl:  .  ondansetron (ZOFRAN) 4 MG tablet, , Disp: , Rfl:  .  pregabalin (LYRICA) 50 MG capsule, , Disp: , Rfl:  .  promethazine (PHENERGAN) 12.5 MG tablet, Take 12.5 mg by mouth., Disp: , Rfl:  .  valACYclovir (VALTREX) 1000 MG tablet, Take 1 tablet (1,000 mg total) by mouth 3 (three) times daily., Disp: 21 tablet, Rfl: 0 .  cyclobenzaprine (FLEXERIL) 10 MG tablet, Take 1 tablet (10 mg total) by mouth 2 (two) times daily as needed for muscle spasms. (Patient not taking: Reported on 10/28/2020), Disp: 20 tablet, Rfl: 0 .  terbinafine (LAMISIL) 250 MG tablet, Take 1 tablet (250 mg total) by mouth daily. (Patient not taking: Reported on 10/20/2018), Disp: 90 tablet, Rfl: 0  Allergies  Allergen Reactions  . Aspirin Other (See Comments)    Causes asthma to act up   . Erythromycin Other (See Comments)    Stomach cramping     Patient Care Team: [place]  as PCP - General (Family Medicine)  Review of Systems  10 Systems reviewed and are negative for acute change except as noted in the HPI.   I personally reviewed active problem list, medication list, allergies, family history, social history, health maintenance, notes from last encounter, lab results, imaging with the patient/caregiver today.        Objective:   Vitals:  Vitals:   10/28/20 1201  BP: 120/80  Pulse: 93  Resp: 16  Temp: 98.2 F (36.8 C)  TempSrc: Oral  SpO2: 96%  Weight: 134 lb 1.6 oz (60.8 kg)  Height: _0  (1.626 m)    Body mass index is 23.02 kg/m.  Physical Exam Vitals and nursing note reviewed.  Constitutional:      General: She is not in acute distress.    Appearance: Normal appearance. She is well-developed. She is not ill-appearing, toxic-appearing or diaphoretic.     Interventions: Face mask in place.  HENT:     Head:  Normocephalic and atraumatic.     Right Ear: External ear normal.     Left Ear: External ear normal.  Eyes:     General: Lids are normal. No scleral icterus.       Right eye: No discharge.        Left eye: No discharge.     Conjunctiva/sclera: Conjunctivae normal.  Neck:     Trachea: Phonation normal. No tracheal deviation.  Cardiovascular:     Rate and Rhythm: Normal rate and regular rhythm.     Pulses: Normal pulses.          Radial pulses are 2+ on the right side and 2+ on the left side.       Posterior tibial pulses are 2+ on the right side and 2+ on the left side.     Heart sounds: Normal heart sounds. No murmur heard. No friction rub. No gallop.   Pulmonary:     Effort: Pulmonary effort is normal. No respiratory distress.     Breath sounds: Normal breath sounds. No stridor. No wheezing, rhonchi or rales.  Chest:     Chest wall: No tenderness.  Abdominal:     General: Bowel sounds are normal. There is no distension.     Palpations: Abdomen is soft.  Musculoskeletal:     Right lower leg: No edema.     Left lower leg: No edema.  Skin:    General: Skin is warm and dry.     Coloration: Skin is not jaundiced or pale.     Findings: No rash.  Neurological:     Mental Status: She is alert.     Motor: No abnormal muscle tone.     Gait: Gait normal.  Psychiatric:        Mood and Affect: Mood normal.        Speech: Speech normal.        Behavior: Behavior normal.       Fall Risk: Fall Risk  10/28/2020 08/15/2020 10/22/2019 10/20/2018 08/21/2018  Falls in the past year? 1 0 0 0 No  Number falls in past yr: 0 0 0 0 -  Injury with Fall? 1 0 0 0 -  Follow up Falls evaluation completed Falls evaluation completed Falls evaluation completed - -    Functional Status Survey: Is the patient deaf or have difficulty hearing?: No Does the patient have difficulty seeing, even when wearing glasses/contacts?: No Does the patient have difficulty concentrating, remembering, or making  decisions?: No Does  the patient have difficulty walking or climbing stairs?: No Does the patient have difficulty dressing or bathing?: No Does the patient have difficulty doing errands alone such as visiting a doctor's office or shopping?: No   Assessment & Plan:    CPE completed today  . USPSTF grade A and B recommendations reviewed with patient; age-appropriate recommendations, preventive care, screening tests, etc discussed and encouraged; healthy living encouraged; see AVS for patient education given to patient  . Discussed importance of 150 minutes of physical activity weekly, AHA exercise recommendations given to pt in AVS/handout  . Discussed importance of healthy diet:  eating lean meats and proteins, avoiding trans fats and saturated fats, avoid simple sugars and excessive carbs in diet, eat 6 servings of fruit/vegetables daily and drink plenty of water and avoid sweet beverages.    . Recommended pt to do annual eye exam and routine dental exams/cleanings  . Depression, alcohol, fall screening completed as documented above and per flowsheets  . Reviewed Health Maintenance: Health Maintenance  Topic Date Due  . COVID-19 Vaccine (3 - Pfizer risk 4-dose series) 03/22/2020  . MAMMOGRAM  01/24/2021  . PAP SMEAR-Modifier  10/21/2021  . TETANUS/TDAP  07/07/2022  . INFLUENZA VACCINE  Completed  . Hepatitis C Screening  Completed  . HIV Screening  Completed    . Immunizations: Immunization History  Administered Date(s) Administered  . Influenza,inj,Quad PF,6+ Mos 09/05/2015, 09/03/2017, 08/21/2018, 08/28/2019  . Influenza-Unspecified 08/21/2017, 07/15/2020  . PFIZER SARS-COV-2 Vaccination 01/23/2020, 02/23/2020  . Td 07/07/2002  . Tdap 07/07/2012      ICD-10-CM   1. Adult general medical exam  Z00.00 Comprehensive metabolic panel    Lipid panel    CBC with Differential/Platelet    Thyroid Panel With TSH  2. Encounter for screening mammogram for malignant neoplasm of  breast  Z12.31 MM 3D SCREEN BREAST BILATERAL   Ordered, due in March  3. Seasonal allergic rhinitis, unspecified trigger  J30.2 levocetirizine (XYZAL) 5 MG tablet   On half dose Xyzal, second antihistamine in the morning, Singulair  4. Asthma, unspecified asthma severity, unspecified whether complicated, unspecified whether persistent  J45.909 levocetirizine (XYZAL) 5 MG tablet   Stable, well-controlled, usually triggered by weather changes and allergies, has albuterol inhaler and all her allergy medication including Singulair  5. Fibromyalgia syndrome  M79.7    Managed by neurology specialist at [facility] on Lyrica, baclofen  6. Adult hypothyroidism  E03.9 CBC with Differential/Platelet    Thyroid Panel With TSH   Did not do labs that were previously ordered, no new symptoms, chronic fatigue, on 50 mcg levothyroxine daily good compliant recheck labs  7. Gastro-esophageal reflux disease without esophagitis  K21.9    On Prilosec daily, no change in her symptoms, does not want follow-up with GI at this time but will let us know next year when she is ready  8. Lipid screening  Z13.220 Lipid panel  9. Screening for endocrine, nutritional, metabolic and immunity disorder  Z13.29 Comprehensive metabolic panel   U93.23 Lipid panel   Z13.228 Thyroid Panel With TSH   Z13.0   10. Screening, anemia, deficiency, iron  Z13.0 CBC with Differential/Platelet  11. Eczema, unspecified type  L30.9    Severe rash and burning to hands bilaterally suspect dyshidrotic eczema, discussed management, hydration with Vaseline, can use prescribe steroids twice daily   6 month f/up for routine conditions 12 month CPE  Delsa Grana, PA-C 10/28/20 12:17 PM  [facility]

## 2020-11-10 LAB — COMPREHENSIVE METABOLIC PANEL
ALT: 20 IU/L (ref 0–32)
AST: 22 IU/L (ref 0–40)
Albumin/Globulin Ratio: 2 (ref 1.2–2.2)
Albumin: 4.5 g/dL (ref 3.8–4.8)
Alkaline Phosphatase: 73 IU/L (ref 44–121)
BUN/Creatinine Ratio: 14 (ref 9–23)
BUN: 11 mg/dL (ref 6–24)
Bilirubin Total: 0.6 mg/dL (ref 0.0–1.2)
CO2: 24 mmol/L (ref 20–29)
Calcium: 8.9 mg/dL (ref 8.7–10.2)
Chloride: 101 mmol/L (ref 96–106)
Creatinine, Ser: 0.78 mg/dL (ref 0.57–1.00)
GFR calc Af Amer: 104 mL/min/{1.73_m2} (ref >59)
GFR calc non Af Amer: 90 mL/min/{1.73_m2} (ref >59)
Globulin, Total: 2.2 g/dL (ref 1.5–4.5)
Glucose: 69 mg/dL (ref 65–99)
Potassium: 4.5 mmol/L (ref 3.5–5.2)
Sodium: 142 mmol/L (ref 134–144)
Total Protein: 6.7 g/dL (ref 6.0–8.5)

## 2020-11-10 LAB — CBC WITH DIFFERENTIAL/PLATELET
Basophils Absolute: 0 10*3/uL (ref 0.0–0.2)
Basos: 1 %
EOS (ABSOLUTE): 0.2 10*3/uL (ref 0.0–0.4)
Eos: 3 %
Hematocrit: 38.9 % (ref 34.0–46.6)
Hemoglobin: 12.9 g/dL (ref 11.1–15.9)
Immature Grans (Abs): 0 10*3/uL (ref 0.0–0.1)
Immature Granulocytes: 0 %
Lymphocytes Absolute: 1.4 10*3/uL (ref 0.7–3.1)
Lymphs: 27 %
MCH: 29.4 pg (ref 26.6–33.0)
MCHC: 33.2 g/dL (ref 31.5–35.7)
MCV: 89 fL (ref 79–97)
Monocytes Absolute: 0.4 10*3/uL (ref 0.1–0.9)
Monocytes: 8 %
Neutrophils Absolute: 3.2 10*3/uL (ref 1.4–7.0)
Neutrophils: 61 %
Platelets: 189 10*3/uL (ref 150–450)
RBC: 4.39 x10E6/uL (ref 3.77–5.28)
RDW: 13 % (ref 11.7–15.4)
WBC: 5.2 10*3/uL (ref 3.4–10.8)

## 2020-11-10 LAB — LIPID PANEL
Chol/HDL Ratio: 2.6 ratio (ref 0.0–4.4)
Cholesterol, Total: 213 mg/dL — ABNORMAL HIGH (ref 100–199)
HDL: 81 mg/dL (ref >39)
LDL Chol Calc (NIH): 122 mg/dL — ABNORMAL HIGH (ref 0–99)
Triglycerides: 57 mg/dL (ref 0–149)
VLDL Cholesterol Cal: 10 mg/dL (ref 5–40)

## 2020-11-10 LAB — THYROID PANEL WITH TSH
Free Thyroxine Index: 2.6 (ref 1.2–4.9)
T3 Uptake Ratio: 29 % (ref 24–39)
T4, Total: 8.8 ug/dL (ref 4.5–12.0)
TSH: 2.03 u[IU]/mL (ref 0.450–4.500)

## 2020-11-21 ENCOUNTER — Ambulatory Visit (INDEPENDENT_AMBULATORY_CARE_PROVIDER_SITE_OTHER): Payer: Managed Care, Other (non HMO) | Admitting: Dermatology

## 2020-11-21 ENCOUNTER — Other Ambulatory Visit: Payer: Self-pay

## 2020-11-21 DIAGNOSIS — B351 Tinea unguium: Secondary | ICD-10-CM

## 2020-11-21 DIAGNOSIS — L578 Other skin changes due to chronic exposure to nonionizing radiation: Secondary | ICD-10-CM

## 2020-11-21 DIAGNOSIS — D2262 Melanocytic nevi of left upper limb, including shoulder: Secondary | ICD-10-CM | POA: Diagnosis not present

## 2020-11-21 DIAGNOSIS — L814 Other melanin hyperpigmentation: Secondary | ICD-10-CM

## 2020-11-21 DIAGNOSIS — D2271 Melanocytic nevi of right lower limb, including hip: Secondary | ICD-10-CM | POA: Diagnosis not present

## 2020-11-21 DIAGNOSIS — D229 Melanocytic nevi, unspecified: Secondary | ICD-10-CM

## 2020-11-21 DIAGNOSIS — L309 Dermatitis, unspecified: Secondary | ICD-10-CM

## 2020-11-21 DIAGNOSIS — D692 Other nonthrombocytopenic purpura: Secondary | ICD-10-CM

## 2020-11-21 DIAGNOSIS — D1800 Hemangioma unspecified site: Secondary | ICD-10-CM

## 2020-11-21 DIAGNOSIS — L858 Other specified epidermal thickening: Secondary | ICD-10-CM

## 2020-11-21 DIAGNOSIS — Z1283 Encounter for screening for malignant neoplasm of skin: Secondary | ICD-10-CM | POA: Diagnosis not present

## 2020-11-21 DIAGNOSIS — L821 Other seborrheic keratosis: Secondary | ICD-10-CM | POA: Diagnosis not present

## 2020-11-21 MED ORDER — JUBLIA 10 % EX SOLN: CUTANEOUS | 6 refills | Status: DC

## 2020-11-21 MED ORDER — FLUCONAZOLE 200 MG PO TABS: 200.0000 mg | ORAL_TABLET | Freq: Every day | ORAL | 0 refills | Status: AC

## 2020-11-21 NOTE — Progress Notes (Signed)
Follow-Up Visit   Subjective  Brenda Price is a 50 y.o. female who presents for the following: Annual Exam (No history of skin cancer or abnormal moles. No changes noticed./She has tinea unguium of the toenails. She is using Jublia currently, and Diflucan in the past. Her toenails are starting to worsen some.).  She also has a hand dermatitis that is currently flared.  She uses [facility] cream which helps a little but doesn't clear it.   The following portions of the chart were reviewed this encounter and updated as appropriate:       Review of Systems:  No other skin or systemic complaints except as noted in HPI or Assessment and Plan.  Objective  Well appearing patient in no apparent distress; mood and affect are within normal limits.  A full examination was performed including scalp, head, eyes, ears, nose, lips, neck, chest, axillae, abdomen, back, buttocks, bilateral upper extremities, bilateral lower extremities, hands, feet, fingers, toes, fingernails, and toenails. All findings within normal limits unless otherwise noted below.  Objective  Right Medial Calf: Medium dark brown macule, 2.62mm  Left Forearm: 3.27mm speckled brown macule  Objective  Right Top of Shoulder: Speckled waxy brown papule, 5.79mm  Objective  Hands: Erythema and scale of the fingers with fissures, especially tips.  Objective  toenails: Thickening distal bil great toenails. Compared to previous photos, increased involvement of left foot gt toenail and decreased involvement of right foot gt toenail. Purpuric macule R distal 2nd toe. New photos taken today.  Images       Assessment & Plan   Skin cancer screening performed today.  Actinic Damage - chronic, secondary to cumulative UV radiation exposure/sun exposure over time - diffuse scaly erythematous macules with underlying dyspigmentation - Recommend daily broad spectrum sunscreen SPF 30+ to sun-exposed areas, reapply every 2 hours as  needed.  - Call for new or changing lesions. Lentigines - Scattered tan macules - Discussed due to sun exposure - Benign, observe - Recommend daily broad spectrum sunscreen SPF 30+ to sun-exposed areas, reapply every 2 hours as needed. - Call for any changes Melanocytic Nevi - Tan-brown and/or pink-flesh-colored symmetric macules and papules - Benign appearing on exam today - Observation - Call clinic for new or changing moles - Recommend daily use of broad spectrum spf 30+ sunscreen to sun-exposed areas.   Seborrheic Keratoses - Stuck-on, waxy, tan-brown papules and plaques  - Discussed benign etiology and prognosis. - Observe - Call for any changes   Purpura - - Violaceous macule of the right 2nd toe distal - Benign - Likely related to trauma of dystrophic nail - Observe, should grow out - Call for worsening or other concerns  Keratosis Pilaris - Tiny follicular keratotic papules - Benign. Genetic in nature. No cure. - Observe. - If desired, patient can use an emollient (moisturizer) containing ammonium lactate, urea or salicylic acid once a day to smooth the area  Hemangiomas - Red papules - Discussed benign nature - Observe - Call for any changes    Nevus (2) Left Forearm; Right Medial Calf  Benign-appearing.  Stable. Observation.  Call clinic for new or changing moles.  Recommend daily use of broad spectrum spf 30+ sunscreen to sun-exposed areas.    Seborrheic keratosis Right Top of Shoulder  Benign-appearing, observe. Stable.   Hand dermatitis Hands  Chronic condition, with flare Start Clobetasol Cream - Apply to affected areas hands/fingers QD/BID until improved. Patient has at home. Avoid f/g/a.  Topical steroids (such as  triamcinolone, fluocinolone, fluocinonide, mometasone, clobetasol, halobetasol, betamethasone, hydrocortisone) can cause thinning and lightening of the skin if they are used for too long in the same area. Your physician has  selected the right strength medicine for your problem and area affected on the body. Please use your medication only as directed by your physician to prevent side effects.   Hand Dermatitis is a chronic type of eczema that can come and go on the hands and fingers.  While there is no cure, the rash and symptoms can be managed with topical prescription medications, and for more severe cases, with systemic medications.  Recommend mild soap and routine use of moisturizing cream after handwashing.  Minimize soap/water exposure when possible.      Tinea unguium toenails  Not at goal Restart fluconazole 200mg  take 1 po q week (Rx written 1 po QD #30).  Discussed rare risk liver inflammation  Continue Jublia solution qhs.  fluconazole (DIFLUCAN) 200 MG tablet - toenails  Efinaconazole (JUBLIA) 10 % SOLN - toenails  Return in about 4 months (around 03/21/2021) for Toenails.  IJamesetta Orleans, CMA, am acting as scribe for Brendolyn Patty, MD .  Documentation: I have reviewed the above documentation for accuracy and completeness, and I agree with the above.  Brendolyn Patty MD

## 2020-11-21 NOTE — Patient Instructions (Addendum)
Fluconazole 200mg  - Take 1 tablet by mouth every week. (Prescription will be written to take daily.)  Clobetasol Cream - Apply to hands/fingers 1-2 times a day as needed for hand eczema.  Hand Dermatitis is a chronic type of eczema that can come and go on the hands and fingers.  While there is no cure, the rash and symptoms can be managed with topical prescription medications, and for more severe cases, with systemic medications.  Recommend mild soap and routine use of moisturizing cream after handwashing.  Minimize soap/water exposure when possible.    Topical steroids (such as triamcinolone, fluocinolone, fluocinonide, mometasone, clobetasol, halobetasol, betamethasone, hydrocortisone) can cause thinning and lightening of the skin if they are used for too long in the same area. Your physician has selected the right strength medicine for your problem and area affected on the body. Please use your medication only as directed by your physician to prevent side effects.

## 2020-12-08 ENCOUNTER — Other Ambulatory Visit: Payer: Self-pay | Admitting: Family Medicine

## 2020-12-08 DIAGNOSIS — E039 Hypothyroidism, unspecified: Secondary | ICD-10-CM

## 2021-03-21 ENCOUNTER — Ambulatory Visit: Payer: Managed Care, Other (non HMO) | Admitting: Dermatology

## 2021-04-04 ENCOUNTER — Other Ambulatory Visit: Payer: Self-pay | Admitting: Neurology

## 2021-04-04 DIAGNOSIS — G5133 Clonic hemifacial spasm, bilateral: Secondary | ICD-10-CM

## 2021-04-06 ENCOUNTER — Other Ambulatory Visit: Payer: Self-pay | Admitting: Family Medicine

## 2021-04-06 DIAGNOSIS — J45909 Unspecified asthma, uncomplicated: Secondary | ICD-10-CM

## 2021-04-13 ENCOUNTER — Other Ambulatory Visit: Payer: Self-pay

## 2021-04-13 ENCOUNTER — Ambulatory Visit
Admission: RE | Admit: 2021-04-13 | Discharge: 2021-04-13 | Disposition: A | Discharge status: Home / Self Care | Payer: Managed Care, Other (non HMO) | Source: Ambulatory Visit | Attending: Neurology | Admitting: Neurology

## 2021-04-13 DIAGNOSIS — G5133 Clonic hemifacial spasm, bilateral: Secondary | ICD-10-CM | POA: Diagnosis present

## 2021-04-13 MED ORDER — GADOBUTROL 1 MMOL/ML IV SOLN
6.0000 mL | Freq: Once | INTRAVENOUS | Status: AC | PRN
  Administered 2021-04-13: 6 mL via INTRAVENOUS

## 2021-05-02 ENCOUNTER — Ambulatory Visit: Payer: Managed Care, Other (non HMO) | Admitting: Family Medicine

## 2021-05-12 ENCOUNTER — Ambulatory Visit: Payer: Managed Care, Other (non HMO) | Admitting: Family Medicine

## 2021-05-17 ENCOUNTER — Other Ambulatory Visit: Payer: Self-pay | Admitting: Family Medicine

## 2021-05-17 DIAGNOSIS — J45909 Unspecified asthma, uncomplicated: Secondary | ICD-10-CM

## 2021-05-17 DIAGNOSIS — J302 Other seasonal allergic rhinitis: Secondary | ICD-10-CM

## 2021-05-24 ENCOUNTER — Ambulatory Visit: Payer: Managed Care, Other (non HMO) | Admitting: Family Medicine

## 2021-06-01 ENCOUNTER — Encounter: Payer: Self-pay | Admitting: Family Medicine

## 2021-06-01 ENCOUNTER — Other Ambulatory Visit: Payer: Self-pay

## 2021-06-01 ENCOUNTER — Ambulatory Visit: Payer: Managed Care, Other (non HMO) | Admitting: Family Medicine

## 2021-06-01 VITALS — BP 138/76 | Temp 98.1°F | Resp 16 | Ht 64.0 in | Wt 137.9 lb

## 2021-06-01 DIAGNOSIS — Z5181 Encounter for therapeutic drug level monitoring: Secondary | ICD-10-CM

## 2021-06-01 DIAGNOSIS — J302 Other seasonal allergic rhinitis: Secondary | ICD-10-CM

## 2021-06-01 DIAGNOSIS — E039 Hypothyroidism, unspecified: Secondary | ICD-10-CM

## 2021-06-01 DIAGNOSIS — M797 Fibromyalgia: Secondary | ICD-10-CM

## 2021-06-01 DIAGNOSIS — M5481 Occipital neuralgia: Secondary | ICD-10-CM

## 2021-06-01 DIAGNOSIS — J45909 Unspecified asthma, uncomplicated: Secondary | ICD-10-CM

## 2021-06-01 DIAGNOSIS — E782 Mixed hyperlipidemia: Secondary | ICD-10-CM

## 2021-06-01 DIAGNOSIS — G5133 Clonic hemifacial spasm, bilateral: Secondary | ICD-10-CM

## 2021-06-01 NOTE — Progress Notes (Signed)
Name: Brenda Price   MRN: [id number]    DOB: 1971-01-08   Date:06/01/2021       Progress Note  Chief Complaint  Patient presents with   Hypothyroidism   Follow-up    6 month recheck     Subjective:   Brenda Price is a 50 y.o. female, presents to clinic for routine f/up  Hypothyroidism: Current Medication Regimen: 50 mcg synthroid Takes medicine correctly Current Symptoms: denies fatigue, weight changes, heat/cold intolerance, bowel/skin changes or CVS symptoms Most recent results are below; we will be repeating labs today. Lab Results  Component Value Date   TSH 2.030 11/09/2020   T4TOTAL 8.8 11/09/2020   Hyperlipidemia: Not on meds, low risk per ASCVD The 10-year ASCVD risk score Mikey Bussing DC Jr., et al., 2013) is: 0.8%   Values used to calculate the score:     Age: 50 years     Sex: Female     Is Non-Hispanic African American: No     Diabetic: No     Tobacco smoker: No     Systolic Blood Pressure: 300 mmHg     Is BP treated: No     HDL Cholesterol: 81 mg/dL     Total Cholesterol: 213 mg/dL  Last Lipids: Lab Results  Component Value Date   CHOL 213 (H) 11/09/2020   HDL 81 11/09/2020   LDLCALC 122 (H) 11/09/2020   TRIG 57 11/09/2020   CHOLHDL 2.6 11/09/2020   - Denies: Chest pain, shortness of breath, myalgias, claudication  Seeing [facility] for HA/migraine/occipital neuralgia, hemifacial spasm affecting both sides - recent MRI, procedures, med changes reviewed  Last OV A&P May 2022 per Neurology: 1. Intractable migraine headaches daily, severe with CGRP therapy due to insurance We will write notes for both insurance (to be written) and work (below).  To Whom It May Concern,  Brenda Price, DOB 1971-10-13, is under our medical care. She is having significant deterioration in her health and due to the need for regular medication dosing which can impact level of alertness it is strongly recommended that she have a consistent work schedule,  preferably day shifts without any night shifts.    Preventative Treatments - Emgality injections for migraine prevention are being prepared at pharmacy. (not taking)  - Refilled/Continue nortripyline 50 mg tablet  Take 1 tablet nightly  Abortive Treatments - Toradol 60 mg in office today, no phenergan  Ms. Gombos is having significant severe headaches that have not been resolved by conservative treatment. Patient asked for urgent intervention. We gave patient toradol (ketorolac) injection 60 mg intramuscular once.  -Refilled Nurtec 75 mg tablet Take 1 tablet as needed for acute headache Disp #8 with 11 refills  - Start benadryl 12.5 mg at night to help mitigate headaches/dizziness and allergies. This is over the counter  - Continue Zofran 4 mg as needed for nausea.  - Continue phenergan 12.5 mg as needed for nausea.   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  2. Occipital Neuralgia - Stable  - Continue Lyrica 25 cap Take 1 in morning and 50 mg at night  - Occipital Neuralgia is typically caused by involvement of occipital nerve at the base of the skull. It can cause intense sharp, jabbing, electric shock like pain in back of the head and neck. Patient can have radiating pain in other parts of the  head and face. Arthritic changes, neck muscle tension/spasm, trauma and other things can cause occipital neuralgia. Conservative measures such as heat application, rest, massage, over the counter medications, etc can be helpful. Occipital nerve block can be beneficial in patients when other interventions don't help. -Refill/Continue baclofen 10 mg as needed.   3. 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 Then increase to 1 tablet twice daily.  Labs: BMP in 4 weeks.   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  MRI results reviewed by me today: Brenda Price   CLINICAL DATA:  Head and neck tension/pain for 20 years. Right arm  spasms for 4-5 weeks. Hemi facial spasm affecting both sides.   EXAM:  MRI HEAD WITHOUT AND WITH CONTRAST   MRI CERVICAL SPINE WITHOUT AND WITH CONTRAST   TECHNIQUE:  Multiplanar, multiecho pulse sequences of the brain and surrounding  structures, and cervical spine, to include the craniocervical  junction and cervicothoracic junction, were obtained without and  with intravenous contrast.   CONTRAST:  14mL GADAVIST GADOBUTROL 1 MMOL/ML IV SOLN   COMPARISON:  04/08/2020   FINDINGS:  MRI HEAD FINDINGS   Brain: No acute infarction, hemorrhage, hydrocephalus, extra-axial  collection or mass effect. 4 mm left pituitary mass which is  slightly regressed if at all changed from prior. Tiny remote insult  in the right cerebellum which has been present since at least 2018.  Normal brain volume. No chronic white matter disease or abnormal  enhancement. Thin section images of the posterior fossa were  obtained showing no abnormality in the brainstem, cisterns, internal  auditory canals, or temporal bones.   Vascular: Negative   Skull and upper cervical spine: Negative   Sinuses/Orbits: Negative   MRI CERVICAL SPINE FINDINGS   Alignment: Degenerative reversal of cervical lordosis.   Vertebrae: No fracture, evidence of discitis, or bone lesion.   Cord: Normal signal and morphology.   Posterior Fossa, vertebral arteries, paraspinal tissues: Negative.   Disc levels:   C2-3: Unremarkable.   C3-4: Mild facet spurring and disc bulging   C4-5: Disc bulging and left paracentral protrusion. Negative facets.   C5-6: Disc narrowing with bulging and endplate/uncovertebral  ridging.  Biforaminal impingement. Disc material contacts the ventral  cord without compression.   C6-7: Circumferential disc bulging with biforaminal protrusion and  neural impingement. Patent canal   C7-T1:Unremarkable.   IMPRESSION:  Brain MRI:   Stable since at least 2018.  No evident cause for symptoms.   Cervical MRI:   1. Disc degeneration most notable at C5-6 and C6-7 where there is  biforaminal impingement.  2. Up to mild spinal stenosis at C5-6.   Electronically Signed    By: Monte Fantasia M.D.    On: 04/13/2021 12:08  Of note there were prior labs in care everywhere for cortisol, TSH, ACTH, prolactin - likely related to work up for pituitary mass    Allergies - on zyrted, singulair, benadryl per neuro  Current Outpatient Medications:    albuterol (PROVENTIL) (2.5 MG/3ML) 0.083% nebulizer solution, Take 3 mLs (2.5 mg total) by nebulization every 6 (six) hours as needed for wheezing or shortness of breath., Disp: 150 mL, Rfl: 1   albuterol (VENTOLIN HFA) 108 (90 Base) MCG/ACT inhaler, USE  2 INHALATIONS BY MOUTH  EVERY 6 HOURS AS NEEDED FOR WHEEZING OR SHORTNESS OF  BREATH, Disp: 34 g, Rfl: 3   baclofen (LIORESAL) 10 MG tablet, Take 1 tablet (10 mg total) by mouth 3 (three) times daily as needed for muscle spasms., Disp: 30 each, Rfl: 5   cetirizine (ZYRTEC) 10 MG tablet, Take 10 mg by mouth daily., Disp: , Rfl:    Efinaconazole (JUBLIA) 10 % SOLN, Apply to toenails every night., Disp: 8 mL, Rfl: 6   Galcanezumab-gnlm 120 MG/ML SOAJ, Inject 120 mg into the skin every 30 (thirty) days., Disp: , Rfl:    ibuprofen (ADVIL) 800 MG tablet, Take 1 tablet (800 mg total) by mouth every 8 (eight) hours as needed., Disp: 21 tablet, Rfl: 0   ketorolac (TORADOL) 10 MG tablet, Take 10 mg by mouth every 6 (six) hours as needed., Disp: , Rfl:    levocetirizine (XYZAL) 5 MG tablet, TAKE ONE-HALF TABLET BY  MOUTH IN THE EVENING, Disp: 45 tablet, Rfl: 3   levothyroxine (SYNTHROID) 50 MCG tablet,  TAKE 1 TABLET BY MOUTH  DAILY, Disp: 90 tablet, Rfl: 3   montelukast (SINGULAIR) 10 MG tablet, TAKE 1 TABLET(10 MG) BY MOUTH DAILY, Disp: 90 tablet, Rfl: 3   nortriptyline (PAMELOR) 10 MG capsule, Take 10 mg by mouth at bedtime., Disp: , Rfl:    omeprazole (PRILOSEC) 20 MG capsule, Take 20 mg by mouth daily., Disp: , Rfl:    ondansetron (ZOFRAN) 4 MG tablet, , Disp: , Rfl:    pregabalin (LYRICA) 50 MG capsule, , Disp: , Rfl:    promethazine (PHENERGAN) 12.5 MG tablet, Take 12.5 mg by mouth., Disp: , Rfl:    triamcinolone ointment (KENALOG) 0.1 %, Apply 1 application topically 2 (two) times daily., Disp: 30 g, Rfl: 2  Patient Active Problem List   Diagnosis Date Noted   Fibromyalgia syndrome 10/28/2020   Bilateral occipital neuralgia 08/21/2018   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 Dr. Norton Blizzard [facility]   LAPAROSCOPY Right 03/03/2016   Procedure:  diagnostic laparoscopy and right oophrectomy;  Surgeon: Aletha Halim, MD;  Location: [facility];  Service: Gynecology;  Laterality: Right;    Family History  Problem Relation Age of Onset   Heart attack Father    Arthritis Mother    Hyperlipidemia Mother    Thyroid disease Brother    Breast cancer Neg Hx     Social History   Tobacco Use   Smoking status: Never   Smokeless tobacco: Never  Vaping Use   Vaping Use: Never used  Substance Use Topics   Alcohol use: Yes    Comment: occasional   Drug use: No     Allergies   Allergen Reactions   Aspirin Other (See Comments)    Causes asthma to act up    Erythromycin Other (See Comments)    Stomach cramping     Health Maintenance  Topic Date Due   Pneumococcal Vaccine 36-53 Years old (1 -  PCV) Never done   COLONOSCOPY (Pts 45-78yrs Insurance coverage will need to be confirmed)  Never done   MAMMOGRAM  01/24/2021   COVID-19 Vaccine (4 - Booster for Pfizer series) 02/20/2021   INFLUENZA VACCINE  06/12/2021   PAP SMEAR-Modifier  10/21/2021   TETANUS/TDAP  07/07/2022   Hepatitis C Screening  Completed   HIV Screening  Completed   HPV VACCINES  Aged Out    Chart Review Today: I personally reviewed active problem list, medication list, allergies, family history, social history, health maintenance, notes from last encounter, lab results, Price with the patient/caregiver today.   Review of Systems  Constitutional: Negative.   HENT: Negative.    Eyes: Negative.   Respiratory: Negative.    Cardiovascular: Negative.   Gastrointestinal: Negative.   Endocrine: Negative.   Genitourinary: Negative.   Musculoskeletal: Negative.   Skin: Negative.   Allergic/Immunologic: Negative.   Neurological: Negative.   Hematological: Negative.   Psychiatric/Behavioral: Negative.    All other systems reviewed and are negative.   Objective:   Vitals:   06/01/21 1548  BP: 138/76  Resp: 16  Temp: 98.1 F (36.7 C)  TempSrc: Oral  SpO2: 98%  Weight: 137 lb 14.4 oz (62.6 kg)  Height: 5\' 4"  (1.626 m)    Body mass index is 23.67 kg/m.  Physical Exam Vitals and nursing note reviewed.  Constitutional:      General: She is not in acute distress.    Appearance: Normal appearance. She is well-developed. She is not ill-appearing, toxic-appearing or diaphoretic.     Interventions: Face mask in place.  HENT:     Head: Normocephalic and atraumatic.     Right Ear: External ear normal.     Left Ear: External ear normal.  Eyes:     General: Lids are normal. No  scleral icterus.       Right eye: No discharge.        Left eye: No discharge.     Conjunctiva/sclera: Conjunctivae normal.  Neck:     Trachea: Phonation normal. No tracheal deviation.  Cardiovascular:     Rate and Rhythm: Normal rate and regular rhythm.     Pulses: Normal pulses.          Radial pulses are 2+ on the right side and 2+ on the left side.       Posterior tibial pulses are 2+ on the right side and 2+ on the left side.     Heart sounds: Normal heart sounds. No murmur heard.   No friction rub. No gallop.  Pulmonary:     Effort: Pulmonary effort is normal. No respiratory distress.     Breath sounds: Normal breath sounds. No stridor. No wheezing, rhonchi or rales.  Chest:     Chest wall: No tenderness.  Abdominal:     General: Bowel sounds are normal. There is no distension.     Palpations: Abdomen is soft.  Musculoskeletal:     Right lower leg: No edema.     Left lower leg: No edema.  Skin:    General: Skin is warm and dry.     Coloration: Skin is not jaundiced or pale.     Findings: No rash.  Neurological:     Mental Status: She is alert.     Motor: No abnormal muscle tone.     Gait: Gait normal.  Psychiatric:        Mood and Affect: Mood normal.        Speech:  Speech normal.        Behavior: Behavior normal.        Assessment & Plan:     ICD-10-CM   1. Seasonal allergic rhinitis, unspecified trigger  J30.2    allergy meds have been controlled symptoms    2. Asthma, unspecified asthma severity, unspecified whether complicated, unspecified whether persistent  J45.909    rare sx, not on inhalers or having exacerbations    3. Fibromyalgia syndrome  M79.7    per neuro - last OV, MRI, meds and plan all reviewed today    4. Adult hypothyroidism  E03.9 Thyroid Panel With TSH   recheck labs    5. Encounter for medication monitoring  Z51.81 CBC with Differential/Platelet    Thyroid Panel With TSH    Comprehensive metabolic panel    6. Therapeutic drug  monitoring  Z51.81 CBC with Differential/Platelet    Comprehensive metabolic panel    7. Mixed hyperlipidemia  E78.2 Comprehensive metabolic panel   monitoring lfts    8. Bilateral occipital neuralgia  M54.81    per specialists as noted above    9. 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   Delsa Grana, PA-C 06/01/21 3:41 PM

## 2021-06-14 LAB — COMPREHENSIVE METABOLIC PANEL
ALT: 14 IU/L (ref 0–32)
AST: 20 IU/L (ref 0–40)
Albumin/Globulin Ratio: 1.9 (ref 1.2–2.2)
Albumin: 4.5 g/dL (ref 3.8–4.8)
Alkaline Phosphatase: 69 IU/L (ref 44–121)
BUN/Creatinine Ratio: 7 — ABNORMAL LOW (ref 9–23)
BUN: 6 mg/dL (ref 6–24)
Bilirubin Total: 0.6 mg/dL (ref 0.0–1.2)
CO2: 26 mmol/L (ref 20–29)
Calcium: 9.5 mg/dL (ref 8.7–10.2)
Chloride: 98 mmol/L (ref 96–106)
Creatinine, Ser: 0.91 mg/dL (ref 0.57–1.00)
Globulin, Total: 2.4 g/dL (ref 1.5–4.5)
Glucose: 75 mg/dL (ref 65–99)
Potassium: 4.4 mmol/L (ref 3.5–5.2)
Sodium: 136 mmol/L (ref 134–144)
Total Protein: 6.9 g/dL (ref 6.0–8.5)
eGFR: 78 mL/min/{1.73_m2} (ref >59)

## 2021-06-14 LAB — CBC WITH DIFFERENTIAL/PLATELET
Basophils Absolute: 0 10*3/uL (ref 0.0–0.2)
Basos: 1 %
EOS (ABSOLUTE): 0.2 10*3/uL (ref 0.0–0.4)
Eos: 5 %
Hematocrit: 41.5 % (ref 34.0–46.6)
Hemoglobin: 13.9 g/dL (ref 11.1–15.9)
Immature Grans (Abs): 0 10*3/uL (ref 0.0–0.1)
Immature Granulocytes: 0 %
Lymphocytes Absolute: 1.4 10*3/uL (ref 0.7–3.1)
Lymphs: 42 %
MCH: 29.3 pg (ref 26.6–33.0)
MCHC: 33.5 g/dL (ref 31.5–35.7)
MCV: 87 fL (ref 79–97)
Monocytes Absolute: 0.3 10*3/uL (ref 0.1–0.9)
Monocytes: 9 %
Neutrophils Absolute: 1.4 10*3/uL (ref 1.4–7.0)
Neutrophils: 43 %
Platelets: 217 10*3/uL (ref 150–450)
RBC: 4.75 x10E6/uL (ref 3.77–5.28)
RDW: 13.7 % (ref 11.7–15.4)
WBC: 3.3 10*3/uL — ABNORMAL LOW (ref 3.4–10.8)

## 2021-06-14 LAB — THYROID PANEL WITH TSH
Free Thyroxine Index: 2.5 (ref 1.2–4.9)
T3 Uptake Ratio: 28 % (ref 24–39)
T4, Total: 8.9 ug/dL (ref 4.5–12.0)
TSH: 3.04 u[IU]/mL (ref 0.450–4.500)

## 2021-06-15 ENCOUNTER — Encounter: Payer: Self-pay | Admitting: Family Medicine

## 2021-10-09 ENCOUNTER — Other Ambulatory Visit: Payer: Self-pay | Admitting: Family Medicine

## 2021-10-09 DIAGNOSIS — Z1231 Encounter for screening mammogram for malignant neoplasm of breast: Secondary | ICD-10-CM

## 2021-10-31 ENCOUNTER — Encounter: Payer: Managed Care, Other (non HMO) | Admitting: Family Medicine

## 2021-11-08 ENCOUNTER — Encounter: Payer: Self-pay | Admitting: Internal Medicine

## 2021-11-08 ENCOUNTER — Ambulatory Visit (INDEPENDENT_AMBULATORY_CARE_PROVIDER_SITE_OTHER): Payer: Managed Care, Other (non HMO) | Admitting: Internal Medicine

## 2021-11-08 VITALS — BP 118/78 | HR 93 | Temp 98.2°F | Resp 16 | Ht 64.0 in | Wt 135.1 lb

## 2021-11-08 DIAGNOSIS — Z1211 Encounter for screening for malignant neoplasm of colon: Secondary | ICD-10-CM | POA: Diagnosis not present

## 2021-11-08 DIAGNOSIS — Z0000 Encounter for general adult medical examination without abnormal findings: Secondary | ICD-10-CM | POA: Diagnosis not present

## 2021-11-08 DIAGNOSIS — E782 Mixed hyperlipidemia: Secondary | ICD-10-CM | POA: Diagnosis not present

## 2021-11-08 DIAGNOSIS — Z124 Encounter for screening for malignant neoplasm of cervix: Secondary | ICD-10-CM | POA: Diagnosis not present

## 2021-11-08 DIAGNOSIS — J302 Other seasonal allergic rhinitis: Secondary | ICD-10-CM

## 2021-11-08 MED ORDER — LEVOCETIRIZINE DIHYDROCHLORIDE 5 MG PO TABS: ORAL_TABLET | ORAL | 3 refills | Status: DC

## 2021-11-08 NOTE — Patient Instructions (Addendum)
°  It was great seeing you today!  Plan discussed at today's visit: -Blood work ordered today, lab slips given. -Allergy medication refilled.  -Pap today, we will either call or send a Mychart message with results -Referral to GI for colonoscopy ordered today as well.  Follow up in: 1 year  Take care and let us know if you have any questions or concerns prior to your next visit.  Dr. Rosana Berger   Kegel Exercises Kegel exercises can help strengthen your pelvic floor muscles. The pelvic floor is a group of muscles that support your rectum, small intestine, and bladder. In females, pelvic floor muscles also help support the uterus. These muscles help you control the flow of urine and stool (feces). Kegel exercises are painless and simple. They do not require any equipment. Your provider may suggest Kegel exercises to: Improve bladder and bowel control. Improve sexual response. Improve weak pelvic floor muscles after surgery to remove the uterus (hysterectomy) or after pregnancy, in females. Improve weak pelvic floor muscles after prostate gland removal or surgery, in males. Kegel exercises involve squeezing your pelvic floor muscles. These are the same muscles you squeeze when you try to stop the flow of urine or keep from passing gas. The exercises can be done while sitting, standing, or lying down, but it is best to vary your position. Ask your health care provider which exercises are safe for you. Do exercises exactly as told by your health care provider and adjust them as directed. Do not begin these exercises until told by your health care provider. Exercises How to do Kegel exercises: Squeeze your pelvic floor muscles tight. You should feel a tight lift in your rectal area. If you are a female, you should also feel a tightness in your vaginal area. Keep your stomach, buttocks, and legs relaxed. Hold the muscles tight for up to 10 seconds. Breathe normally. Relax your muscles for up to 10  seconds. Repeat as told by your health care provider. Repeat this exercise daily as told by your health care provider. Continue to do this exercise for at least 4-6 weeks, or for as long as told by your health care provider. You may be referred to a physical therapist who can help you learn more about how to do Kegel exercises. Depending on your condition, your health care provider may recommend: Varying how long you squeeze your muscles. Doing several sets of exercises every day. Doing exercises for several weeks. Making Kegel exercises a part of your regular exercise routine. This information is not intended to replace advice given to you by your health care provider. Make sure you discuss any questions you have with your health care provider. Document Revised: 03/09/2021 Document Reviewed: 03/09/2021 Elsevier Patient Education  2022 [employer].

## 2021-11-08 NOTE — Progress Notes (Signed)
Name: Brenda Price   MRN: [id number]    DOB: 1971-07-13   Date:11/08/2021       Progress Note  Subjective  Chief Complaint  Chief Complaint  Patient presents with   Annual Exam    HPI  Patient presents for annual CPE. Doing well.   Diet: fruits and vegetables with each meal, does eat some processed foods Exercise: 15 minute walks most days with her dog   [place] Office Visit from 06/01/2021 in [facility]  AUDIT-C Score 0      Depression: Phq 9 is  negative Depression screen [facility] 2/9 11/08/2021 06/01/2021 10/28/2020 08/15/2020 10/22/2019  Decreased Interest 0 0 0 0 0  Down, Depressed, Hopeless 0 0 0 0 0  PHQ - 2 Score 0 0 0 0 0  Altered sleeping 0 - - - 0  Tired, decreased energy 0 - - - 0  Change in appetite 0 - - - 0  Feeling bad or failure about yourself  0 - - - 0  Trouble concentrating 0 - - - 0  Moving slowly or fidgety/restless 0 - - - 0  Suicidal thoughts 0 - - - 0  PHQ-9 Score 0 - - - 0  Difficult doing work/chores Not difficult at all - - - Not difficult at all   Hypertension: BP Readings from Last 3 Encounters:  11/08/21 118/78  06/01/21 138/76  10/28/20 120/80   Obesity: Wt Readings from Last 3 Encounters:  11/08/21 135 lb 1.6 oz (61.3 kg)  06/01/21 137 lb 14.4 oz (62.6 kg)  10/28/20 134 lb 1.6 oz (60.8 kg)   BMI Readings from Last 3 Encounters:  11/08/21 23.19 kg/m  06/01/21 23.67 kg/m  10/28/20 23.02 kg/m     Vaccines:  HPV: up to at age 38 , ask insurance if age between 69-45  Shingrix: 63-64 yo and ask insurance if covered when patient above 22 yo Pneumonia: Educated and discussed with patient. Flu: Educated and discussed with patient.  Hep C Screening: negative in 2019 STD testing and prevention (HIV/chl/gon/syphilis): No Intimate partner violence:None Menstrual History/LMP/Abnormal Bleeding: Hysterectomy, still has has cervix, no hx of abnormal Paps Incontinence Symptoms: since hysterectomy, does have  some stress incontinence, had been doing Kegels in the past  Breast cancer:  - Last Mammogram: scheduled in January, last mammogram in 3/21 questioned asymmetries and follow up diagnostic mammogram and [place] ordered, recommended screening mammogram in 1 year.  Osteoporosis: Discussed high calcium and vitamin D supplementation, weight bearing exercises  Cervical cancer screening: Pap today  Skin cancer: Discussed monitoring for atypical lesions. Goes to Dermatologist once a year.  Colorectal cancer: Due, colonoscopy ordered today. Lung cancer:  Low Dose CT Chest recommended if Age 49-80 years, 20 pack-year currently smoking OR have quit w/in 15years. Patient does not qualify.   ECG: 2021  Advanced Care Planning: A voluntary discussion about advance care planning including the explanation and discussion of advance directives.  Discussed health care proxy and Living will, and the patient was able to identify a health care proxy as husband Fatim Vanderschaaf and mother Zara Chess.  Patient does not have a living will at present time. If patient does have living will, I have requested they bring this to the clinic to be scanned in to their chart.   Lipids: Lab Results  Component Value Date   CHOL 213 (H) 11/09/2020   CHOL 195 11/03/2019   CHOL 222 (H) 09/05/2018   Lab Results  Component Value Date  HDL 81 11/09/2020   HDL 79 11/03/2019   HDL 78 09/05/2018   Lab Results  Component Value Date   LDLCALC 122 (H) 11/09/2020   LDLCALC 105 (H) 11/03/2019   LDLCALC 131 (H) 09/05/2018   Lab Results  Component Value Date   TRIG 57 11/09/2020   TRIG 59 11/03/2019   TRIG 63 09/05/2018   Lab Results  Component Value Date   CHOLHDL 2.6 11/09/2020   CHOLHDL 2.5 11/03/2019   CHOLHDL 2.8 09/05/2018   No results found for: LDLDIRECT  Glucose: Glucose  Date Value Ref Range Status  06/13/2021 75 65 - 99 mg/dL Final  11/09/2020 69 65 - 99 mg/dL Final  11/03/2019 83 65 - 99 mg/dL Final    Glucose, Bld  Date Value Ref Range Status  04/08/2020 97 70 - 99 mg/dL Final    Comment:    Glucose reference range applies only to samples taken after fasting for at least 8 hours.  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 Dr. Norton Blizzard OBG[facility]   LAPAROSCOPY Right 03/03/2016   Procedure:  diagnostic laparoscopy and right oophrectomy;  Surgeon: Aletha Halim, MD;  Location: [facility] ORS;  Service: Gynecology;  Laterality: Right;    Family History  Problem Relation Age of Onset   Heart attack Father    Arthritis Mother    Hyperlipidemia Mother    Thyroid disease Brother    Breast cancer Neg Hx     Social History   Socioeconomic History   Marital status: Married    Spouse name: Gerald Stabs   Number of children: 2   Years of education: Not on file   Highest education level: Not on file  Occupational History   Not on file  Tobacco Use   Smoking status: Never   Smokeless tobacco: Never  Vaping Use   Vaping Use: Never used  Substance and Sexual Activity   Alcohol use: Yes    Comment: occasional   Drug use: No   Sexual activity: Yes    Partners: Female  Other Topics Concern   Not on file  Social History Narrative   Not on file   Social Determinants of Health   Financial Resource Strain: Low Risk     Difficulty of Paying Living Expenses: Not hard at all  Food Insecurity: No Food Insecurity   Worried About Charity fundraiser in the Last Year: Never true   [place] in the Last Year: Never true  Transportation Needs: No Transportation Needs   Lack of Transportation (Medical): No   Lack of Transportation (Non-Medical): No  Physical Activity: Insufficiently Active   Days of Exercise per Week: 6 days   Minutes of Exercise per Session: 20 min  Stress: Stress Concern Present   Feeling of Stress :  To some extent  Social Connections: Moderately Integrated   Frequency of Communication with Friends and Family: More than three times a week   Frequency of Social Gatherings with Friends and Family: More than three times a week   Attends Religious Services: 1 to 4 times per year   Active Member of [employer] or Organizations: No   Attends Archivist Meetings: Never   Marital Status: Married  Human resources officer Violence: Not At Risk   Fear of Current or Ex-Partner: No   Emotionally Abused: No   Physically Abused: No   Sexually Abused: No     Current Outpatient Medications:    albuterol (PROVENTIL) (2.5 MG/3ML) 0.083% nebulizer solution, Take 3 mLs (2.5 mg total) by nebulization every 6 (six) hours as needed for wheezing or shortness of breath., Disp: 150 mL, Rfl: 1   albuterol (VENTOLIN HFA) 108 (90 Base) MCG/ACT inhaler, USE 2 INHALATIONS BY MOUTH  EVERY 6 HOURS AS NEEDED FOR WHEEZING OR SHORTNESS OF  BREATH, Disp: 34 g, Rfl: 3   baclofen (LIORESAL) 10 MG tablet, Take 1 tablet (10 mg total) by mouth 3 (three) times daily as needed for muscle spasms., Disp: 30 each, Rfl: 5   cetirizine (ZYRTEC) 10 MG tablet, Take 10 mg by mouth daily., Disp: , Rfl:    Galcanezumab-gnlm 120 MG/ML SOAJ, Inject 120 mg into the skin every 30 (thirty) days., Disp: , Rfl:    ketorolac (TORADOL) 10 MG tablet, Take 10 mg by mouth every 6 (six) hours as needed., Disp: , Rfl:    levocetirizine (XYZAL) 5 MG  tablet, TAKE ONE-HALF TABLET BY  MOUTH IN THE EVENING, Disp: 45 tablet, Rfl: 3   levothyroxine (SYNTHROID) 50 MCG tablet, TAKE 1 TABLET BY MOUTH  DAILY, Disp: 90 tablet, Rfl: 3   montelukast (SINGULAIR) 10 MG tablet, TAKE 1 TABLET(10 MG) BY MOUTH DAILY, Disp: 90 tablet, Rfl: 3   nortriptyline (PAMELOR) 10 MG capsule, Take 10 mg by mouth at bedtime., Disp: , Rfl:    ondansetron (ZOFRAN) 4 MG tablet, , Disp: , Rfl:    OXcarbazepine (TRILEPTAL) 150 MG tablet, Take 150 mg by mouth 2 (two) times daily., Disp: , Rfl:    pregabalin (LYRICA) 50 MG capsule, , Disp: , Rfl:    promethazine (PHENERGAN) 12.5 MG tablet, Take 12.5 mg by mouth., Disp: , Rfl:    triamcinolone ointment (KENALOG) 0.1 %, Apply 1 application topically 2 (two) times daily., Disp: 30 g, Rfl: 2  Allergies  Allergen Reactions   Aspirin Other (See Comments)    Causes asthma to act up    Erythromycin Other (See Comments)    Stomach cramping      Review of Systems  Constitutional: Negative.   HENT: Negative.    Respiratory: Negative.    Cardiovascular: Negative.   Gastrointestinal: Negative.   Genitourinary: Negative.   Musculoskeletal: Negative.   Skin: Negative.     Objective  Vitals:   11/08/21 0846  BP: 118/78  Pulse: 93  Resp: 16  Temp: 98.2 F (36.8 C)  SpO2: 99%  Weight: 135 lb 1.6 oz (61.3 kg)  Height: 5\' 4"  (1.626 m)    Body mass index is 23.19 kg/m.  Physical Exam Constitutional:      Appearance: Normal appearance.  HENT:     Head: Normocephalic and atraumatic.  Eyes:     Conjunctiva/sclera: Conjunctivae normal.  Cardiovascular:     Rate and Rhythm: Normal rate and regular rhythm.  Pulmonary:     Effort: Pulmonary  effort is normal.     Breath sounds: Normal breath sounds.  Chest:  Breasts:    Right: Normal. No swelling, bleeding, inverted nipple, mass, nipple discharge, skin change or tenderness.     Left: Normal. No swelling, bleeding, inverted nipple, mass, nipple discharge, skin change  or tenderness.  Genitourinary:    Comments: External genitalia within normal limits.  Vaginal mucosa pink, moist, normal rugae.  Nonfriable cervix without lesions, no discharge or bleeding noted on speculum exam.   Musculoskeletal:     Right lower leg: No edema.     Left lower leg: No edema.  Lymphadenopathy:     Upper Body:     Right upper body: No supraclavicular, axillary or pectoral adenopathy.     Left upper body: No supraclavicular, axillary or pectoral adenopathy.  Skin:    General: Skin is warm and dry.  Neurological:     General: No focal deficit present.     Mental Status: She is alert. Mental status is at baseline.  Psychiatric:        Mood and Affect: Mood normal.        Behavior: Behavior normal.     No results found for this or any previous visit (from the past 2160 hour(s)).  Diabetic Foot Exam: Diabetic Foot Exam - Simple   No data filed     Fall Risk: Fall Risk  11/08/2021 06/01/2021 10/28/2020 08/15/2020 10/22/2019  Falls in the past year? 0 0 1 0 0  Number falls in past yr: 0 0 0 0 0  Injury with Fall? 0 0 1 0 0  Follow up - Falls evaluation completed Falls evaluation completed Falls evaluation completed Falls evaluation completed    Functional Status Survey: Is the patient deaf or have difficulty hearing?: No Does the patient have difficulty seeing, even when wearing glasses/contacts?: No Does the patient have difficulty concentrating, remembering, or making decisions?: No Does the patient have difficulty walking or climbing stairs?: No Does the patient have difficulty dressing or bathing?: No Does the patient have difficulty doing errands alone such as visiting a doctor's office or shopping?: No   Assessment & Plan  1. Adult general medical exam/Mixed hyperlipidemia:  Blood work due today.  - CBC w/Diff/Platelet - Lipid panel - Comprehensive Metabolic Panel (CMET)  2. Screening for cervical cancer: Pap today.  - IGP, Aptima HPV, rfx  16/18,45  3. Screening for colon cancer: Colonoscopy ordered. - Ambulatory referral to Gastroenterology  4. Seasonal allergic rhinitis, unspecified trigger: Allergy medications refilled.   - levocetirizine (XYZAL) 5 MG tablet; TAKE ONE-HALF TABLET BY  MOUTH IN THE EVENING  Dispense: 45 tablet; Refill: 3  -USPSTF grade A and B recommendations reviewed with patient; age-appropriate recommendations, preventive care, screening tests, etc discussed and encouraged; healthy living encouraged; see AVS for patient education given to patient -Discussed importance of 150 minutes of physical activity weekly, eat two servings of fish weekly, eat one serving of tree nuts ( cashews, pistachios, pecans, almonds.Marland Kitchen) every other day, eat 6 servings of fruit/vegetables daily and drink plenty of water and avoid sweet beverages.   -Reviewed Health Maintenance: yes

## 2021-11-09 ENCOUNTER — Telehealth: Payer: Self-pay

## 2021-11-09 NOTE — Telephone Encounter (Signed)
CALLED PATIENT NO ANSWER LEFT VOICEMAIL FOR A CALL BACK ? ?

## 2021-11-10 ENCOUNTER — Other Ambulatory Visit: Payer: Self-pay | Admitting: Family Medicine

## 2021-11-10 DIAGNOSIS — E039 Hypothyroidism, unspecified: Secondary | ICD-10-CM

## 2021-11-10 LAB — IGP, APTIMA HPV, RFX 16/18,45: HPV Aptima: NEGATIVE

## 2021-11-10 NOTE — Telephone Encounter (Signed)
Requested Prescriptions  Pending Prescriptions Disp Refills   levothyroxine (SYNTHROID) 50 MCG tablet [Pharmacy Med Name: Levothyroxine Sodium 50 MCG Oral Tablet] 90 tablet 3    Sig: TAKE 1 TABLET BY MOUTH  DAILY     Endocrinology:  Hypothyroid Agents Failed - 11/10/2021 10:01 PM      Failed - TSH needs to be rechecked within 3 months after an abnormal result. Refill until TSH is due.      Passed - TSH in normal range and within 360 days    TSH  Date Value Ref Range Status  06/13/2021 3.040 0.450 - 4.500 uIU/mL Final         Passed - Valid encounter within last 12 months    Recent Outpatient Visits          2 days ago Screening for colon cancer   [place], DO   5 months ago Seasonal allergic rhinitis, unspecified trigger   [facility] Delsa Grana, PA-C   1 year ago Adult general medical exam   [facility] Delsa Grana, PA-C   1 year ago Herpes zoster without complication   [facility] Delsa Grana, PA-C   2 years ago Well woman exam (no gynecological exam)   [place], [place]      Future Appointments            In 12 months Teodora Medici, [facility], [facility]

## 2021-11-16 ENCOUNTER — Other Ambulatory Visit: Payer: Self-pay

## 2021-11-16 DIAGNOSIS — Z1211 Encounter for screening for malignant neoplasm of colon: Secondary | ICD-10-CM

## 2021-11-16 MED ORDER — CLENPIQ 10-3.5-12 MG-GM -GM/160ML PO SOLN: 1.0000 | Freq: Once | ORAL | 0 refills | Status: AC

## 2021-11-16 NOTE — Progress Notes (Signed)
Gastroenterology Pre-Procedure Review  Request Date: 12/29/2021 Requesting Physician: Dr. Marius Ditch  PATIENT REVIEW QUESTIONS: The patient responded to the following health history questions as indicated:    1. Are you having any GI issues?  Some constipation and GERD 2. Do you have a personal history of Polyps? no 3. Do you have a family history of Colon Cancer or Polyps? no 4. Diabetes Mellitus? no 5. Joint replacements in the past 12 months?no 6. Major health problems in the past 3 months?no 7. Any artificial heart valves, MVP, or defibrillator?no    MEDICATIONS & ALLERGIES:    Patient reports the following regarding taking any anticoagulation/antiplatelet therapy:   Plavix, Coumadin, Eliquis, Xarelto, Lovenox, Pradaxa, Brilinta, or Effient? no Aspirin? no  Patient confirms/reports the following medications:  Current Outpatient Medications  Medication Sig Dispense Refill   albuterol (PROVENTIL) (2.5 MG/3ML) 0.083% nebulizer solution Take 3 mLs (2.5 mg total) by nebulization every 6 (six) hours as needed for wheezing or shortness of breath. 150 mL 1   albuterol (VENTOLIN HFA) 108 (90 Base) MCG/ACT inhaler USE 2 INHALATIONS BY MOUTH  EVERY 6 HOURS AS NEEDED FOR WHEEZING OR SHORTNESS OF  BREATH 34 g 3   baclofen (LIORESAL) 10 MG tablet Take 1 tablet (10 mg total) by mouth 3 (three) times daily as needed for muscle spasms. 30 each 5   Galcanezumab-gnlm 120 MG/ML SOAJ Inject 120 mg into the skin every 30 (thirty) days.     ketorolac (TORADOL) 10 MG tablet Take 10 mg by mouth every 6 (six) hours as needed.     levocetirizine (XYZAL) 5 MG tablet TAKE ONE-HALF TABLET BY  MOUTH IN THE EVENING 45 tablet 3   levothyroxine (SYNTHROID) 50 MCG tablet TAKE 1 TABLET BY MOUTH  DAILY 90 tablet 3   montelukast (SINGULAIR) 10 MG tablet TAKE 1 TABLET(10 MG) BY MOUTH DAILY 90 tablet 3   nortriptyline (PAMELOR) 10 MG capsule Take 10 mg by mouth at bedtime.     ondansetron (ZOFRAN) 4 MG tablet       OXcarbazepine (TRILEPTAL) 150 MG tablet Take 150 mg by mouth 2 (two) times daily.     pregabalin (LYRICA) 50 MG capsule      promethazine (PHENERGAN) 12.5 MG tablet Take 12.5 mg by mouth.     triamcinolone ointment (KENALOG) 0.1 % Apply 1 application topically 2 (two) times daily. 30 g 2   No current facility-administered medications for this visit.    Patient confirms/reports the following allergies:  Allergies  Allergen Reactions   Aspirin Other (See Comments)    Causes asthma to act up    Erythromycin Other (See Comments)    Stomach cramping     No orders of the defined types were placed in this encounter.   AUTHORIZATION INFORMATION Primary Insurance: 1D#: Group #:  Secondary Insurance: 1D#: Group #:  SCHEDULE INFORMATION: Date: 12/29/2021 Time: Location: [facility]

## 2021-12-07 ENCOUNTER — Telehealth: Payer: Self-pay

## 2021-12-07 MED ORDER — PEG 3350-KCL-NA BICARB-NACL 420 G PO SOLR
4000.0000 mL | Freq: Once | ORAL | 0 refills | Status: AC
Start: 2021-12-07 — End: 2021-12-07

## 2021-12-07 NOTE — Telephone Encounter (Signed)
Patient wanted a better option for prep sent in I sent in different prep

## 2021-12-12 ENCOUNTER — Other Ambulatory Visit: Payer: Self-pay

## 2021-12-12 ENCOUNTER — Ambulatory Visit
Admission: RE | Admit: 2021-12-12 | Discharge: 2021-12-12 | Disposition: A | Discharge status: Home / Self Care | Payer: Managed Care, Other (non HMO) | Source: Ambulatory Visit | Attending: Family Medicine | Admitting: Family Medicine

## 2021-12-12 DIAGNOSIS — Z1231 Encounter for screening mammogram for malignant neoplasm of breast: Secondary | ICD-10-CM | POA: Insufficient documentation

## 2021-12-29 ENCOUNTER — Encounter: Payer: Self-pay | Admitting: Gastroenterology

## 2021-12-29 ENCOUNTER — Encounter
Admission: RE | Disposition: A | Discharge status: Home / Self Care | Payer: Self-pay | Source: Ambulatory Visit | Attending: Gastroenterology

## 2021-12-29 ENCOUNTER — Ambulatory Visit: Payer: Managed Care, Other (non HMO) | Admitting: Certified Registered Nurse Anesthetist

## 2021-12-29 ENCOUNTER — Other Ambulatory Visit: Payer: Self-pay

## 2021-12-29 ENCOUNTER — Ambulatory Visit
Admission: RE | Admit: 2021-12-29 | Discharge: 2021-12-29 | Disposition: A | Discharge status: Home / Self Care | Payer: Managed Care, Other (non HMO) | Source: Ambulatory Visit | Attending: Gastroenterology | Admitting: Gastroenterology

## 2021-12-29 DIAGNOSIS — D123 Benign neoplasm of transverse colon: Secondary | ICD-10-CM

## 2021-12-29 DIAGNOSIS — K589 Irritable bowel syndrome without diarrhea: Secondary | ICD-10-CM | POA: Diagnosis not present

## 2021-12-29 DIAGNOSIS — Z1211 Encounter for screening for malignant neoplasm of colon: Secondary | ICD-10-CM | POA: Diagnosis not present

## 2021-12-29 DIAGNOSIS — K6389 Other specified diseases of intestine: Secondary | ICD-10-CM | POA: Insufficient documentation

## 2021-12-29 DIAGNOSIS — E039 Hypothyroidism, unspecified: Secondary | ICD-10-CM | POA: Diagnosis not present

## 2021-12-29 DIAGNOSIS — Z79899 Other long term (current) drug therapy: Secondary | ICD-10-CM | POA: Diagnosis not present

## 2021-12-29 DIAGNOSIS — M797 Fibromyalgia: Secondary | ICD-10-CM | POA: Diagnosis not present

## 2021-12-29 DIAGNOSIS — J45909 Unspecified asthma, uncomplicated: Secondary | ICD-10-CM | POA: Insufficient documentation

## 2021-12-29 DIAGNOSIS — Z79890 Hormone replacement therapy (postmenopausal): Secondary | ICD-10-CM | POA: Insufficient documentation

## 2021-12-29 HISTORY — PX: COLONOSCOPY WITH PROPOFOL: SHX5780

## 2021-12-29 SURGERY — COLONOSCOPY WITH PROPOFOL
Anesthesia: General

## 2021-12-29 MED ORDER — LIDOCAINE HCL (PF) 2 % IJ SOLN
INTRAMUSCULAR | Status: AC
  Filled 2021-12-29: qty 30

## 2021-12-29 MED ORDER — PHENYLEPHRINE HCL-NACL 20-0.9 MG/250ML-% IV SOLN
INTRAVENOUS | Status: AC
  Filled 2021-12-29: qty 250

## 2021-12-29 MED ORDER — PROPOFOL 10 MG/ML IV BOLUS
INTRAVENOUS | Status: DC | PRN
Start: 2021-12-29 — End: 2021-12-29
  Administered 2021-12-29: 10 mg via INTRAVENOUS
  Administered 2021-12-29: 70 mg via INTRAVENOUS

## 2021-12-29 MED ORDER — LIDOCAINE HCL (CARDIAC) PF 100 MG/5ML IV SOSY
PREFILLED_SYRINGE | INTRAVENOUS | Status: DC | PRN
Start: 2021-12-29 — End: 2021-12-29
  Administered 2021-12-29: 50 mg via INTRAVENOUS

## 2021-12-29 MED ORDER — PROPOFOL 500 MG/50ML IV EMUL
INTRAVENOUS | Status: AC
  Filled 2021-12-29: qty 150

## 2021-12-29 MED ORDER — PROPOFOL 500 MG/50ML IV EMUL
INTRAVENOUS | Status: DC | PRN
  Administered 2021-12-29: 140 ug/kg/min via INTRAVENOUS

## 2021-12-29 MED ORDER — PROPOFOL 500 MG/50ML IV EMUL
INTRAVENOUS | Status: AC
  Filled 2021-12-29: qty 50

## 2021-12-29 MED ORDER — SODIUM CHLORIDE 0.9 % IV SOLN: INTRAVENOUS | Status: DC

## 2021-12-29 MED ORDER — GLYCOPYRROLATE 0.2 MG/ML IJ SOLN
INTRAMUSCULAR | Status: AC
  Filled 2021-12-29: qty 1

## 2021-12-29 NOTE — H&P (Signed)
Cephas Darby, MD [address]  [place]  [place], [place] [address]  Main: [phone]  Fax: [phone] Pager: [phone]  Primary Care Physician:  Delsa Grana, PA-C Primary Gastroenterologist:  Dr. Cephas Darby  Pre-Procedure History & Physical: HPI:  Brenda Price is a 51 y.o. female is here for an colonoscopy.   Past Medical History:  Diagnosis Date   Allergy    Asthma    Cardiac murmur 09/29/2015   Embedded toenail 09/29/2015   Family planning 09/29/2015   Fibromyalgia    Heart murmur    Hemorrhoid 09/29/2015   IBS (irritable bowel syndrome)    Thyroid disease     Past Surgical History:  Procedure Laterality Date   ABDOMINAL HYSTERECTOMY     CESAREAN SECTION     KNEE ARTHROSCOPY     LAPAROSCOPIC TOTAL HYSTERECTOMY     with BS. Dr. Norton Blizzard OBGYN   LAPAROSCOPY Right 03/03/2016   Procedure:  diagnostic laparoscopy and right oophrectomy;  Surgeon: Aletha Halim, MD;  Location: [facility] ORS;  Service: Gynecology;  Laterality: Right;    Prior to Admission medications   Medication Sig Start Date End Date Taking? Authorizing Provider  levocetirizine (XYZAL) 5 MG tablet TAKE ONE-HALF TABLET BY  MOUTH IN THE EVENING 11/08/21  Yes Teodora Medici, DO  levothyroxine (SYNTHROID) 50 MCG tablet TAKE 1 TABLET BY MOUTH  DAILY 11/10/21  Yes Delsa Grana, PA-C  montelukast (SINGULAIR) 10 MG tablet TAKE 1 TABLET(10 MG) BY MOUTH DAILY 05/17/21  Yes Delsa Grana, PA-C  nortriptyline (PAMELOR) 10 MG capsule Take 10 mg by mouth at bedtime.   Yes [provider]  OXcarbazepine (TRILEPTAL) 150 MG tablet Take 150 mg by mouth 2 (two) times daily. 03/31/21  Yes [provider]  pregabalin (LYRICA) 50 MG capsule  10/07/17  Yes [provider]  albuterol (PROVENTIL) (2.5 MG/3ML) 0.083% nebulizer solution Take 3 mLs (2.5 mg total) by nebulization every 6 (six) hours as needed for wheezing or shortness of breath. 10/22/19   Hubbard Hartshorn, FNP   albuterol (VENTOLIN HFA) 108 (90 Base) MCG/ACT inhaler USE 2 INHALATIONS BY MOUTH  EVERY 6 HOURS AS NEEDED FOR WHEEZING OR SHORTNESS OF  BREATH 04/06/21   Delsa Grana, PA-C  baclofen (LIORESAL) 10 MG tablet Take 1 tablet (10 mg total) by mouth 3 (three) times daily as needed for muscle spasms. 10/11/17   Trinna Post, PA-C  Galcanezumab-gnlm 120 MG/ML SOAJ Inject 120 mg into the skin every 30 (thirty) days.    [provider]  ketorolac (TORADOL) 10 MG tablet Take 10 mg by mouth every 6 (six) hours as needed. 10/16/19   [provider]  ondansetron (ZOFRAN) 4 MG tablet  10/16/19   [provider]  promethazine (PHENERGAN) 12.5 MG tablet Take 12.5 mg by mouth. 10/16/19   [provider]  triamcinolone ointment (KENALOG) 0.1 % Apply 1 application topically 2 (two) times daily. 10/28/20   Delsa Grana, PA-C    Allergies as of 11/16/2021 - Review Complete 11/08/2021  Allergen Reaction Noted   Aspirin Other (See Comments) 07/09/2014   Erythromycin Other (See Comments) 07/09/2014    Family History  Problem Relation Age of Onset   Heart attack Father    Arthritis Mother    Hyperlipidemia Mother    Thyroid disease Brother    Breast cancer Neg Hx     Social History   Socioeconomic History   Marital status: Married    Spouse name: Gerald Stabs  Number of children: 2   Years of education: Not on file   Highest education level: Not on file  Occupational History   Not on file  Tobacco Use   Smoking status: Never   Smokeless tobacco: Never  Vaping Use   Vaping Use: Never used  Substance and Sexual Activity   Alcohol use: Yes    Comment: occasional   Drug use: No   Sexual activity: Yes    Partners: Female  Other Topics Concern   Not on file  Social History Narrative   Not on file   Social Determinants of Health   Financial Resource Strain: Low Risk    Difficulty of Paying Living Expenses: Not hard at all  Food Insecurity: No Food Insecurity    Worried About Charity fundraiser in the Last Year: Never true   [place] in the Last Year: Never true  Transportation Needs: No Transportation Needs   Lack of Transportation (Medical): No   Lack of Transportation (Non-Medical): No  Physical Activity: Insufficiently Active   Days of Exercise per Week: 6 days   Minutes of Exercise per Session: 20 min  Stress: Stress Concern Present   Feeling of Stress : To some extent  Social Connections: Moderately Integrated   Frequency of Communication with Friends and Family: More than three times a week   Frequency of Social Gatherings with Friends and Family: More than three times a week   Attends Religious Services: 1 to 4 times per year   Active Member of [employer] or Organizations: No   Attends Archivist Meetings: Never   Marital Status: Married  Human resources officer Violence: Not At Risk   Fear of Current or Ex-Partner: No   Emotionally Abused: No   Physically Abused: No   Sexually Abused: No    Review of Systems: See HPI, otherwise negative ROS  Physical Exam: BP 122/85    Pulse 86    Temp (!) 96.5 F (35.8 C) (Temporal)    Resp 18    Ht 5\' 4"  (1.626 m)    Wt 62.6 kg    SpO2 100%    BMI 23.69 kg/m  General:   Alert,  pleasant and cooperative in NAD Head:  Normocephalic and atraumatic. Neck:  Supple; no masses or thyromegaly. Lungs:  Clear throughout to auscultation.    Heart:  Regular rate and rhythm. Abdomen:  Soft, nontender and nondistended. Normal bowel sounds, without guarding, and without rebound.   Neurologic:  Alert and  oriented x4;  grossly normal neurologically.  Impression/Plan: JANVI AMMAR is here for an colonoscopy to be performed for colon cancer screening  Risks, benefits, limitations, and alternatives regarding  colonoscopy have been reviewed with the patient.  Questions have been answered.  All parties agreeable.   Sherri Sear, MD  12/29/2021, 7:45 AM

## 2021-12-29 NOTE — Anesthesia Preprocedure Evaluation (Signed)
Anesthesia Evaluation  Patient identified by MRN, date of birth, ID band Patient awake    Reviewed: Allergy & Precautions, NPO status , Patient's Chart, lab work & pertinent test results  History of Anesthesia Complications Negative for: history of anesthetic complications  Airway Mallampati: I  TM Distance: >3 FB Neck ROM: Full    Dental no notable dental hx. (+) Teeth Intact   Pulmonary asthma , neg sleep apnea, neg COPD, Patient abstained from smoking.Not current smoker,  Has been taking inhalers more lately with the warmer weather. Today her breathing feels normal.   Pulmonary exam normal breath sounds clear to auscultation       Cardiovascular Exercise Tolerance: Good METS(-) hypertension(-) CAD and (-) Past MI negative cardio ROS  (-) dysrhythmias  Rhythm:Regular Rate:Normal - Systolic murmurs    Neuro/Psych  Headaches, negative psych ROS   GI/Hepatic GERD  ,(+)     (-) substance abuse  ,   Endo/Other  neg diabetesHypothyroidism   Renal/GU negative Renal ROS     Musculoskeletal  (+) Fibromyalgia -  Abdominal   Peds  Hematology   Anesthesia Other Findings Past Medical History: No date: Allergy No date: Asthma 09/29/2015: Cardiac murmur 09/29/2015: Embedded toenail 09/29/2015: Family planning No date: Fibromyalgia No date: Heart murmur 09/29/2015: Hemorrhoid No date: IBS (irritable bowel syndrome) No date: Thyroid disease  Reproductive/Obstetrics                             Anesthesia Physical Anesthesia Plan  ASA: 2  Anesthesia Plan: General   Post-op Pain Management: Minimal or no pain anticipated   Induction: Intravenous  PONV Risk Score and Plan: 3 and Propofol infusion, TIVA and Ondansetron  Airway Management Planned: Nasal Cannula  Additional Equipment: None  Intra-op Plan:   Post-operative Plan:   Informed Consent: I have reviewed the patients History  and Physical, chart, labs and discussed the procedure including the risks, benefits and alternatives for the proposed anesthesia with the patient or authorized representative who has indicated his/her understanding and acceptance.     Dental advisory given  Plan Discussed with: CRNA and Surgeon  Anesthesia Plan Comments: (Discussed risks of anesthesia with patient, including possibility of difficulty with spontaneous ventilation under anesthesia necessitating airway intervention, PONV, and rare risks such as cardiac or respiratory or neurological events, and allergic reactions. Discussed the role of CRNA in patient's perioperative care. Patient understands.)        Anesthesia Quick Evaluation

## 2021-12-29 NOTE — Op Note (Signed)
[facility] Patient Name: Brenda Price Procedure Date: 12/29/2021 7:26 AM MRN: [id number] Account #: [id number] Date of Birth: [birth year] Admit Type: Outpatient Age: 51 Room: [facility] ENDO ROOM 2 Gender: Female Note Status: Finalized Instrument Name: Jasper Riling [id number] Procedure:             Colonoscopy Indications:           Screening for colorectal malignant neoplasm, This is                         the patient's first colonoscopy Providers:             Lin Landsman MD, MD Referring MD:          Delsa Grana (Referring MD) Medicines:             General Anesthesia Complications:         No immediate complications. Estimated blood loss: None. Procedure:             Pre-Anesthesia Assessment:                        - Prior to the procedure, a History and Physical was                         performed, and patient medications and allergies were                         reviewed. The patient is competent. The risks and                         benefits of the procedure and the sedation options and                         risks were discussed with the patient. All questions                         were answered and informed consent was obtained.                         Patient identification and proposed procedure were                         verified by the physician, the nurse, the                         anesthesiologist, the anesthetist and the technician                         in the pre-procedure area in the procedure room in the                         endoscopy suite. Mental Status Examination: alert and                         oriented. Airway Examination: normal oropharyngeal                         airway and neck mobility. Respiratory Examination:  clear to auscultation. CV Examination: normal.                         Prophylactic Antibiotics: The patient does not require                         prophylactic  antibiotics. Prior Anticoagulants: The                         patient has taken no previous anticoagulant or                         antiplatelet agents. ASA Grade Assessment: II - A                         patient with mild systemic disease. After reviewing                         the risks and benefits, the patient was deemed in                         satisfactory condition to undergo the procedure. The                         anesthesia plan was to use general anesthesia.                         Immediately prior to administration of medications,                         the patient was re-assessed for adequacy to receive                         sedatives. The heart rate, respiratory rate, oxygen                         saturations, blood pressure, adequacy of pulmonary                         ventilation, and response to care were monitored                         throughout the procedure. The physical status of the                         patient was re-assessed after the procedure.                        After obtaining informed consent, the colonoscope was                         passed under direct vision. Throughout the procedure,                         the patient's blood pressure, pulse, and oxygen                         saturations were monitored continuously. The  Colonoscope was introduced through the anus and                         advanced to the the cecum, identified by appendiceal                         orifice and ileocecal valve. The colonoscopy was                         performed without difficulty. The patient tolerated                         the procedure well. The quality of the bowel                         preparation was evaluated using the BBPS [facility] Bowel                         Preparation Scale) with scores of: Right Colon = 3,                         Transverse Colon = 3 and Left Colon = 3 (entire mucosa                          seen well with no residual staining, small fragments                         of stool or opaque liquid). The total BBPS score                         equals 9. Findings:      The perianal and digital rectal examinations were normal. Pertinent       negatives include normal sphincter tone and no palpable rectal lesions.      A 5 mm polyp was found in the transverse colon. The polyp was sessile.       The polyp was removed with a cold snare. Resection and retrieval were       complete. Estimated blood loss: none.      The retroflexed view of the distal rectum and anal verge was normal and       showed no anal or rectal abnormalities.      A diffuse area of mild melanosis was found in the ascending colon and in       the cecum. Impression:            - One 5 mm polyp in the transverse colon, removed with                         a cold snare. Resected and retrieved.                        - The distal rectum and anal verge are normal on                         retroflexion view.                        - Melanosis in the colon.  Recommendation:        - Discharge patient to home (with escort).                        - Resume previous diet today.                        - Continue present medications.                        - Await pathology results.                        - Repeat colonoscopy in 5 years for surveillance. Procedure Code(s):     --- Professional ---                        [id number], Colonoscopy, flexible; with removal of                         tumor(s), polyp(s), or other lesion(s) by snare                         technique Diagnosis Code(s):     --- Professional ---                        Z12.11, Encounter for screening for malignant neoplasm                         of colon                        K63.5, Polyp of colon                        K63.89, Other specified diseases of intestine CPT copyright 2019 American Medical Association. All rights reserved. The codes documented  in this report are preliminary and upon coder review may  be revised to meet current compliance requirements. Dr. Ulyess Mort Lin Landsman MD, MD 12/29/2021 8:15:16 AM This report has been signed electronically. Number of Addenda: 0 Note Initiated On: 12/29/2021 7:26 AM Scope Withdrawal Time: 0 hours 9 minutes 41 seconds  Total Procedure Duration: 0 hours 17 minutes 26 seconds  Estimated Blood Loss:  Estimated blood loss: none.      [facility]

## 2021-12-29 NOTE — Anesthesia Postprocedure Evaluation (Signed)
Anesthesia Post Note  Patient: Brenda Price  Procedure(s) Performed: COLONOSCOPY WITH PROPOFOL  Patient location during evaluation: Endoscopy Anesthesia Type: General Level of consciousness: awake and alert Pain management: pain level controlled Vital Signs Assessment: post-procedure vital signs reviewed and stable Respiratory status: spontaneous breathing, nonlabored ventilation, respiratory function stable and patient connected to nasal cannula oxygen Cardiovascular status: blood pressure returned to baseline and stable Postop Assessment: no apparent nausea or vomiting Anesthetic complications: no   No notable events documented.   Last Vitals:  Vitals:   12/29/21 0721 12/29/21 0817  BP: 122/85 102/78  Pulse: 86 77  Resp: 18 16  Temp: (!) 35.8 C (!) 36.1 C  SpO2: 100% 100%    Last Pain:  Vitals:   12/29/21 0817  TempSrc: Bladder  PainSc: 0-No pain                 Arita Miss

## 2021-12-29 NOTE — Anesthesia Procedure Notes (Signed)
Date/Time: 12/29/2021 7:56 AM Performed by: Lily Peer, Natalie Leclaire, CRNA Pre-anesthesia Checklist: Patient identified, Emergency Drugs available, Suction available, Patient being monitored and Timeout performed Patient Re-evaluated:Patient Re-evaluated prior to induction Oxygen Delivery Method: Nasal cannula Induction Type: IV induction

## 2021-12-29 NOTE — Transfer of Care (Signed)
Immediate Anesthesia Transfer of Care Note  Patient: Brenda Price  Procedure(s) Performed: COLONOSCOPY WITH PROPOFOL  Patient Location: Endoscopy Unit  Anesthesia Type:General  Level of Consciousness: awake, alert  and oriented  Airway & Oxygen Therapy: Patient Spontanous Breathing  Post-op Assessment: Report given to RN and Post -op Vital signs reviewed and stable  Post vital signs: Reviewed and stable  Last Vitals:  Vitals Value Taken Time  BP 102/68   Temp    Pulse 77   Resp 18   SpO2 100     Last Pain:  Vitals:   12/29/21 0721  TempSrc: Temporal  PainSc: 0-No pain         Complications: No notable events documented.

## 2022-01-01 ENCOUNTER — Encounter: Payer: Self-pay | Admitting: Gastroenterology

## 2022-01-01 LAB — SURGICAL PATHOLOGY

## 2022-01-30 LAB — LIPID PANEL
Chol/HDL Ratio: 2.7 ratio (ref 0.0–4.4)
Cholesterol, Total: 227 mg/dL — ABNORMAL HIGH (ref 100–199)
HDL: 84 mg/dL (ref >39)
LDL Chol Calc (NIH): 135 mg/dL — ABNORMAL HIGH (ref 0–99)
Triglycerides: 49 mg/dL (ref 0–149)
VLDL Cholesterol Cal: 8 mg/dL (ref 5–40)

## 2022-01-30 LAB — COMPREHENSIVE METABOLIC PANEL
ALT: 15 IU/L (ref 0–32)
AST: 23 IU/L (ref 0–40)
Albumin/Globulin Ratio: 2.5 — ABNORMAL HIGH (ref 1.2–2.2)
Albumin: 4.7 g/dL (ref 3.8–4.8)
Alkaline Phosphatase: 66 IU/L (ref 44–121)
BUN/Creatinine Ratio: 10 (ref 9–23)
BUN: 10 mg/dL (ref 6–24)
Bilirubin Total: 0.5 mg/dL (ref 0.0–1.2)
CO2: 24 mmol/L (ref 20–29)
Calcium: 9.4 mg/dL (ref 8.7–10.2)
Chloride: 101 mmol/L (ref 96–106)
Creatinine, Ser: 0.98 mg/dL (ref 0.57–1.00)
Globulin, Total: 1.9 g/dL (ref 1.5–4.5)
Glucose: 72 mg/dL (ref 70–99)
Potassium: 4.3 mmol/L (ref 3.5–5.2)
Sodium: 140 mmol/L (ref 134–144)
Total Protein: 6.6 g/dL (ref 6.0–8.5)
eGFR: 71 mL/min/{1.73_m2} (ref >59)

## 2022-01-30 LAB — CBC WITH DIFFERENTIAL/PLATELET
Basophils Absolute: 0.1 10*3/uL (ref 0.0–0.2)
Basos: 2 %
EOS (ABSOLUTE): 0.3 10*3/uL (ref 0.0–0.4)
Eos: 7 %
Hematocrit: 37.7 % (ref 34.0–46.6)
Hemoglobin: 12.7 g/dL (ref 11.1–15.9)
Immature Grans (Abs): 0 10*3/uL (ref 0.0–0.1)
Immature Granulocytes: 0 %
Lymphocytes Absolute: 1.6 10*3/uL (ref 0.7–3.1)
Lymphs: 37 %
MCH: 30.4 pg (ref 26.6–33.0)
MCHC: 33.7 g/dL (ref 31.5–35.7)
MCV: 90 fL (ref 79–97)
Monocytes Absolute: 0.4 10*3/uL (ref 0.1–0.9)
Monocytes: 9 %
Neutrophils Absolute: 2 10*3/uL (ref 1.4–7.0)
Neutrophils: 45 %
Platelets: 206 10*3/uL (ref 150–450)
RBC: 4.18 x10E6/uL (ref 3.77–5.28)
RDW: 13.5 % (ref 11.7–15.4)
WBC: 4.4 10*3/uL (ref 3.4–10.8)

## 2022-05-24 ENCOUNTER — Ambulatory Visit: Payer: Self-pay

## 2022-05-24 NOTE — Telephone Encounter (Signed)
  Chief Complaint: insect bite  Symptoms: insect bite to LLE, redness, burning, itching, swelling, 1.5" in diameter  Frequency: occurred Sunday Pertinent Negatives: NA Disposition: '[]'$ ED /'[]'$ Urgent Care (no appt availability in office) / '[x]'$ Appointment(In office/virtual)/ '[]'$  [facility]/ '[]'$ Home Care/ '[]'$ Refused Recommended Disposition /'[]'$ [facility]/ '[]'$  Follow-up with PCP Additional Notes: pt noticed small red bite Sunday and now gotten larger and burning and itching. Calamine lotion helps the best per pt. Also taken benadryl. Scheduled VV tomorrow at [place] with Kristeen Miss, [place].   Reason for Disposition  [1] Red or very tender (to touch) area AND [2] getting larger over 48 hours after the bite  Answer Assessment - Initial Assessment Questions 1. TYPE of INSECT: "What type of insect was it?"      Unsure  2. ONSET: "When did you get bitten?"      Sunday 3. LOCATION: "Where is the insect bite located?"      LLE  4. REDNESS: "Is the area red or pink?" If Yes, ask: "What size is area of redness?" (inches or cm). "When did the redness start?"     Yes 1.5" in diameter  5. PAIN: "Is there any pain?" If Yes, ask: "How bad is it?"  (Scale 1-10; or mild, moderate, severe)     Mild  6. ITCHING: "Does it itch?" If Yes, ask: "How bad is the itch?"    - MILD: doesn't interfere with normal activities   - MODERATE-SEVERE: interferes with work, school, sleep, or other activities      Moderate  7. SWELLING: "How big is the swelling?" (inches, cm, or compare to coins)     Yes  8. OTHER SYMPTOMS: "Do you have any other symptoms?"  (e.g., difficulty breathing, hives)     Burning sensation  Protocols used: Insect Bite-A-AH

## 2022-05-25 ENCOUNTER — Telehealth (INDEPENDENT_AMBULATORY_CARE_PROVIDER_SITE_OTHER): Payer: Managed Care, Other (non HMO) | Admitting: Family Medicine

## 2022-05-25 DIAGNOSIS — L03116 Cellulitis of left lower limb: Secondary | ICD-10-CM

## 2022-05-25 MED ORDER — CEPHALEXIN 500 MG PO CAPS: 500.0000 mg | ORAL_CAPSULE | Freq: Two times a day (BID) | ORAL | 0 refills | Status: AC

## 2022-05-25 NOTE — Progress Notes (Signed)
Name: Brenda Price   MRN: [id number]    DOB: February 24, 1971   Date:05/25/2022       Progress Note  Subjective:    Chief Complaint  Chief Complaint  Patient presents with   Animal Bite    Big Red spot lower thigh of left leg, pt noticed it on Sunday. Itches/burns Unable to give vital signs.    I connected with  Brenda Price  on 05/25/22 at  8:20 AM EDT by a video enabled telemedicine application and verified that I am speaking with the correct person using two identifiers.  I discussed the limitations of evaluation and management by telemedicine and the availability of in person appointments. The patient expressed understanding and agreed to proceed. Staff also discussed with the patient that there may be a patient responsible charge related to this service. 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5 mg total) by nebulization every 6 (six) hours as needed for wheezing or shortness of breath., Disp: 150 mL, Rfl: 1   albuterol (VENTOLIN HFA) 108 (90 Base) MCG/ACT inhaler, USE 2 INHALATIONS BY MOUTH  EVERY 6 HOURS AS NEEDED FOR WHEEZING OR SHORTNESS OF  BREATH, Disp: 34 g, Rfl: 3   baclofen (LIORESAL) 10 MG tablet, Take 1 tablet (10 mg total) by mouth 3 (three) times daily as needed for muscle spasms., Disp: 30 each, Rfl: 5   Galcanezumab-gnlm 120 MG/ML SOAJ, Inject 120 mg into the skin every 30 (thirty) days., Disp: , Rfl:    ketorolac (TORADOL) 10 MG tablet, Take 10 mg by mouth every 6 (six) hours as needed., Disp: , Rfl:    levocetirizine (XYZAL) 5 MG tablet, TAKE ONE-HALF TABLET BY  MOUTH IN THE EVENING, Disp: 45 tablet, Rfl: 3   levothyroxine (SYNTHROID) 50 MCG tablet, TAKE 1 TABLET BY MOUTH  DAILY, Disp: 90 tablet, Rfl: 3   montelukast (SINGULAIR) 10 MG tablet, TAKE 1 TABLET(10 MG) BY MOUTH DAILY, Disp: 90 tablet, Rfl: 3   nortriptyline (PAMELOR) 10 MG capsule, Take 10 mg by mouth at bedtime., Disp: , Rfl:    ondansetron (ZOFRAN) 4 MG tablet, , Disp: , Rfl:    OXcarbazepine (TRILEPTAL) 150 MG tablet, Take 150 mg by mouth 2 (two) times daily., Disp: , Rfl:    pregabalin (LYRICA) 50 MG capsule, , Disp: , Rfl:    promethazine (PHENERGAN) 12.5 MG  tablet, Take 12.5 mg by mouth., Disp: , Rfl:    triamcinolone ointment (KENALOG) 0.1 %, Apply 1 application topically 2 (two) times daily., Disp: 30 g, Rfl: 2  Allergies  Allergen Reactions   Aspirin Other (See Comments)    Causes asthma to act up    Erythromycin Other (See Comments)    Stomach cramping     I personally reviewed active problem list, medication list, allergies, family history, social history, health maintenance, notes from last encounter, lab results, imaging with the patient/caregiver today.   Review of Systems  Constitutional: Negative.   HENT: Negative.    Eyes: Negative.   Respiratory: Negative.     Cardiovascular: Negative.   Gastrointestinal: Negative.   Endocrine: Negative.   Genitourinary: Negative.   Musculoskeletal: Negative.   Skin: Negative.   Allergic/Immunologic: Negative.   Neurological: Negative.   Hematological: Negative.   Psychiatric/Behavioral: Negative.    All other systems reviewed and are negative.     Objective:   Virtual encounter, vitals limited, only able to obtain the following There were no vitals filed for this visit. There is no height or weight on file to calculate BMI. Nursing Note and Vital Signs reviewed.  Physical Exam Vitals and nursing note reviewed.  Constitutional:      General: She is not in acute distress.    Appearance: She is not ill-appearing, toxic-appearing or diaphoretic.  Neck:     Trachea: Trachea and phonation normal.  Pulmonary:     Effort: Pulmonary effort is normal.     Breath sounds: Normal breath sounds.  Skin:    Comments: Unable to really visualize area clearly with limited resolution on video encounter Round to oval area erythematous appearing, non-tender to palpation she states  Neurological:     Mental Status: She is alert.  Psychiatric:        Mood and Affect: Mood normal.        Behavior: Behavior normal.     PE limited by virtual encounter  No results found for this or any previous visit (from the past 72 hour(s)).  Assessment and Plan:     ICD-10-CM   1. Cellulitis of left lower extremity  L03.116 cephALEXin (KEFLEX) 500 MG capsule   may be local allergic rxn, ongoing x 5 d, she was concern for possible skin infection, start abx if any worsening, continue antihistamines     Explained that assessment of the erythematous area is difficult with limited visualization without in person exam.  It has been swollen, had some heat and burning and itching.  It has improved.  There has been no spreading or streaking redness, no purulent discharge, no lymphadenopathy, fever, chills, sweats.  Discussed abx -  she did ask for them to be sent to the pharmacy and she would hold them incase of any signs of cellulitis  Currently no spreading rash - so I did not think systemic steroids were indicated.  -Red flags and when to present for emergency care or RTC including chest pain, shortness of breath, new/worsening/un-resolving symptoms, reviewed with patient at time of visit. Follow up and care instructions discussed and provided in AVS. - I discussed the assessment and treatment plan with the patient. The patient was provided an opportunity to ask questions and all were answered. The patient agreed with the plan and demonstrated an understanding of the instructions.  I provided 15 minutes of non-face-to-face time during this encounter.  Delsa Grana, PA-C 05/25/22 8:43 AM

## 2022-06-04 ENCOUNTER — Ambulatory Visit: Payer: Managed Care, Other (non HMO) | Admitting: Gastroenterology

## 2022-06-04 ENCOUNTER — Other Ambulatory Visit: Payer: Self-pay

## 2022-06-04 ENCOUNTER — Encounter: Payer: Self-pay | Admitting: Gastroenterology

## 2022-06-04 VITALS — BP 132/83 | HR 83 | Temp 97.6°F | Ht 64.0 in | Wt 139.5 lb

## 2022-06-04 DIAGNOSIS — K5904 Chronic idiopathic constipation: Secondary | ICD-10-CM

## 2022-06-04 DIAGNOSIS — K219 Gastro-esophageal reflux disease without esophagitis: Secondary | ICD-10-CM | POA: Diagnosis not present

## 2022-06-04 MED ORDER — OMEPRAZOLE 40 MG PO CPDR: 40.0000 mg | DELAYED_RELEASE_CAPSULE | Freq: Every day | ORAL | 0 refills | Status: DC

## 2022-06-04 NOTE — Progress Notes (Signed)
Cephas Darby, MD [address]  [place]  [place], [place] [address]  Main: [phone]  Fax: [phone]    Gastroenterology Consultation  Referring Provider:     Delsa Grana, PA-C Primary Care Physician:  Delsa Grana, PA-C Primary Gastroenterologist:  Dr. Cephas Darby Reason for Consultation: Chronic idiopathic constipation, chronic GERD        HPI:   Brenda Price is a 51 y.o. female referred by Dr. Delsa Grana, PA-C  for consultation & management of chronic constipation and chronic GERD Chronic constipation: Patient reports that she has been experiencing irregular bowel habits for several years, has been taking Senokot daily.  If she does not take Senokot, she does not have a BM for 1 to 2 weeks and sometimes up to 3 weeks.  Patient reports that she cannot have fruits with skin, which results in severe abdominal cramps.  She also does not consume vegetables daily.  She does drink 64 ounces of water daily.  Her weight has been stable.  She tries to walk 15 to 20 minutes daily and has been physically active.  Patient did not try any prescription medications for constipation so far.  She has not tried MiraLAX or Metamucil  Chronic GERD Patient reports heartburn almost on a daily basis, particularly worse when she has severe episodes of constipation.  She does report nocturnal heartburn.  She was taking Zantac until it was recalled.  Currently, she is not on any acid suppressive medication.  She denies any difficulty swallowing.  She does not smoke or drink alcohol She likes to eat chocolate regularly  NSAIDs: None  Antiplts/Anticoagulants/Anti thrombotics: None  GI Procedures:  Screening colonoscopy 12/29/2021 - One 5 mm polyp in the transverse colon, removed with a cold snare. Resected and retrieved. - The distal rectum and anal verge are normal on retroflexion view. - Melanosis in the colon. DIAGNOSIS:  A. COLON POLYP, TRANSVERSE; COLD SNARE:  - SESSILE  SERRATED POLYP.  - MELANOSIS COLI.  - NEGATIVE FOR DYSPLASIA AND MALIGNANCY.    Past Medical History:  Diagnosis Date   Allergy    Asthma    Cardiac murmur 09/29/2015   Embedded toenail 09/29/2015   Family planning 09/29/2015   Fibromyalgia    Heart murmur    Hemorrhoid 09/29/2015   IBS (irritable bowel syndrome)    Thyroid disease     Past Surgical History:  Procedure Laterality Date   ABDOMINAL HYSTERECTOMY     CESAREAN SECTION     COLONOSCOPY WITH PROPOFOL N/A 12/29/2021   Procedure: COLONOSCOPY WITH PROPOFOL;  Surgeon: Lin Landsman, MD;  Location: [facility];  Service: Gastroenterology;  Laterality: N/A;   KNEE ARTHROSCOPY     LAPAROSCOPIC TOTAL HYSTERECTOMY     with BS. Dr. Norton Blizzard [facility]   LAPAROSCOPY Right 03/03/2016   Procedure:  diagnostic laparoscopy and right oophrectomy;  Surgeon: Aletha Halim, MD;  Location: [facility] ORS;  Service: Gynecology;  Laterality: Right;     Current Outpatient Medications:    albuterol (PROVENTIL) (2.5 MG/3ML) 0.083% nebulizer solution, Take 3 mLs (2.5 mg total) by nebulization every 6 (six) hours as needed for wheezing or shortness of breath., Disp: 150 mL, Rfl: 1   albuterol (VENTOLIN HFA) 108 (90 Base) MCG/ACT inhaler, USE 2 INHALATIONS BY MOUTH  EVERY 6 HOURS AS NEEDED FOR WHEEZING OR SHORTNESS OF  BREATH, Disp: 34 g, Rfl: 3   baclofen (LIORESAL) 10 MG tablet, Take 1 tablet (10 mg total) by mouth 3 (  three) times daily as needed for muscle spasms., Disp: 30 each, Rfl: 5   Galcanezumab-gnlm 120 MG/ML SOAJ, Inject 120 mg into the skin every 30 (thirty) days., Disp: , Rfl:    ketorolac (TORADOL) 10 MG tablet, Take 10 mg by mouth every 6 (six) hours as needed., Disp: , Rfl:    levocetirizine (XYZAL) 5 MG tablet, TAKE ONE-HALF TABLET BY  MOUTH IN THE EVENING, Disp: 45 tablet, Rfl: 3   levothyroxine (SYNTHROID) 50 MCG tablet, TAKE 1 TABLET BY MOUTH  DAILY, Disp: 90 tablet, Rfl: 3   montelukast (SINGULAIR) 10 MG tablet, TAKE 1  TABLET(10 MG) BY MOUTH DAILY, Disp: 90 tablet, Rfl: 3   nortriptyline (PAMELOR) 10 MG capsule, Take 10 mg by mouth at bedtime., Disp: , Rfl:    omeprazole (PRILOSEC) 40 MG capsule, Take 1 capsule (40 mg total) by mouth daily before breakfast., Disp: 30 capsule, Rfl: 0   ondansetron (ZOFRAN) 4 MG tablet, , Disp: , Rfl:    OXcarbazepine (TRILEPTAL) 150 MG tablet, Take 150 mg by mouth 2 (two) times daily., Disp: , Rfl:    pregabalin (LYRICA) 50 MG capsule, , Disp: , Rfl:    promethazine (PHENERGAN) 12.5 MG tablet, Take 12.5 mg by mouth., Disp: , Rfl:    triamcinolone ointment (KENALOG) 0.1 %, Apply 1 application topically 2 (two) times daily., Disp: 30 g, Rfl: 2   Family History  Problem Relation Age of Onset   Heart attack Father    Arthritis Mother    Hyperlipidemia Mother    Thyroid disease Brother    Breast cancer Neg Hx      Social History   Tobacco Use   Smoking status: Never   Smokeless tobacco: Never  Vaping Use   Vaping Use: Never used  Substance Use Topics   Alcohol use: Yes    Comment: occasional   Drug use: No    Allergies as of 06/04/2022 - Review Complete 06/04/2022  Allergen Reaction Noted   Aspirin Other (See Comments) 07/09/2014   Erythromycin Other (See Comments) 07/09/2014    Review of Systems:    All systems reviewed and negative except where noted in HPI.   Physical Exam:  BP 132/83 (BP Location: Left Arm, Patient Position: Sitting, Cuff Size: Normal)   Pulse 83   Temp 97.6 F (36.4 C) (Oral)   Ht '5\' 4"'$  (1.626 m)   Wt 139 lb 8 oz (63.3 kg)   BMI 23.95 kg/m  No LMP recorded. Patient has had a hysterectomy.  General:   Alert,  Well-developed, well-nourished, pleasant and cooperative in NAD Head:  Normocephalic and atraumatic. Eyes:  Sclera clear, no icterus.   Conjunctiva pink. Ears:  Normal auditory acuity. Nose:  No deformity, discharge, or lesions. Mouth:  No deformity or lesions,oropharynx pink & moist. Neck:  Supple; no masses or  thyromegaly. Lungs:  Respirations even and unlabored.  Clear throughout to auscultation.   No wheezes, crackles, or rhonchi. No acute distress. Heart:  Regular rate and rhythm; no murmurs, clicks, rubs, or gallops. Abdomen:  Normal bowel sounds. Soft, non-tender and non-distended without masses, hepatosplenomegaly or hernias noted.  No guarding or rebound tenderness.   Rectal: Not performed Msk:  Symmetrical without gross deformities. Good, equal movement & strength bilaterally. Pulses:  Normal pulses noted. Extremities:  No clubbing or edema.  No cyanosis. Neurologic:  Alert and oriented x3;  grossly normal neurologically. Skin:  Intact without significant lesions or rashes. No jaundice. Psych:  Alert and cooperative. Normal mood and  affect.  Imaging Studies: No abdominal imaging  Assessment and Plan:   CESILIA SHINN is a 51 y.o. female with history of hypothyroidism please seen in consultation for chronic severe constipation and chronic GERD  Chronic severe constipation TSH and free T4 normal Normal hemoglobin Recommend trial of Linzess 145 mcg daily, samples provided Discussed about high-fiber diet, information provided Discussed about fiber supplement such as Benefiber or Citrucel or Metamucil daily  Chronic GERD Recommend EGD for further evaluation including esophageal biopsies Recommend to start omeprazole 40 mg daily before breakfast or dinner for 4 weeks Discussed about antireflux lifestyle, information provided  Follow up in 6 months   Cephas Darby, MD

## 2022-06-04 NOTE — Patient Instructions (Signed)
High-Fiber Eating Plan Fiber, also called dietary fiber, is a type of carbohydrate. It is found foods such as fruits, vegetables, whole grains, and beans. A high-fiber diet can have many health benefits. Your health care provider may recommend a high-fiber diet to help: Prevent constipation. Fiber can make your bowel movements more regular. Lower your cholesterol. Relieve the following conditions: Inflammation of veins in the anus (hemorrhoids). Inflammation of specific areas of the digestive tract (uncomplicated diverticulosis). A problem of the large intestine, also called the colon, that sometimes causes pain and diarrhea (irritable bowel syndrome, or IBS). Prevent overeating as part of a weight-loss plan. Prevent heart disease, type 2 diabetes, and certain cancers. What are tips for following this plan? Reading food labels  Check the nutrition facts label on food products for the amount of dietary fiber. Choose foods that have 5 grams of fiber or more per serving. The goals for recommended daily fiber intake include: Men (age 50 or younger): 34-38 g. Men (over age 50): 28-34 g. Women (age 50 or younger): 25-28 g. Women (over age 50): 22-25 g. Your daily fiber goal is _____________ g. Shopping Choose whole fruits and vegetables instead of processed forms, such as apple juice or applesauce. Choose a wide variety of high-fiber foods such as avocados, lentils, oats, and kidney beans. Read the nutrition facts label of the foods you choose. Be aware of foods with added fiber. These foods often have high sugar and sodium amounts per serving. Cooking Use whole-grain flour for baking and cooking. Cook with brown rice instead of white rice. Meal planning Start the day with a breakfast that is high in fiber, such as a cereal that contains 5 g of fiber or more per serving. Eat breads and cereals that are made with whole-grain flour instead of refined flour or white flour. Eat brown rice, bulgur  wheat, or millet instead of white rice. Use beans in place of meat in soups, salads, and pasta dishes. Be sure that half of the grains you eat each day are whole grains. General information You can get the recommended daily intake of dietary fiber by: Eating a variety of fruits, vegetables, grains, nuts, and beans. Taking a fiber supplement if you are not able to take in enough fiber in your diet. It is better to get fiber through food than from a supplement. Gradually increase how much fiber you consume. If you increase your intake of dietary fiber too quickly, you may have bloating, cramping, or gas. Drink plenty of water to help you digest fiber. Choose high-fiber snacks, such as berries, raw vegetables, nuts, and popcorn. What foods should I eat? Fruits Berries. Pears. Apples. Oranges. Avocado. Prunes and raisins. Dried figs. Vegetables Sweet potatoes. Spinach. Kale. Artichokes. Cabbage. Broccoli. Cauliflower. Green peas. Carrots. Squash. Grains Whole-grain breads. Multigrain cereal. Oats and oatmeal. Brown rice. Barley. Bulgur wheat. Millet. Quinoa. Bran muffins. Popcorn. Rye wafer crackers. Meats and other proteins Navy beans, kidney beans, and pinto beans. Soybeans. Split peas. Lentils. Nuts and seeds. Dairy Fiber-fortified yogurt. Beverages Fiber-fortified soy milk. Fiber-fortified orange juice. Other foods Fiber bars. The items listed above may not be a complete list of recommended foods and beverages. Contact a dietitian for more information. What foods should I avoid? Fruits Fruit juice. Cooked, strained fruit. Vegetables Fried potatoes. Canned vegetables. Well-cooked vegetables. Grains White bread. Pasta made with refined flour. White rice. Meats and other proteins Fatty cuts of meat. Fried chicken or fried fish. Dairy Milk. Yogurt. Cream cheese. Sour cream. Fats and   oils Butters. Beverages Soft drinks. Other foods Cakes and pastries. The items listed above may  not be a complete list of foods and beverages to avoid. Talk with your dietitian about what choices are best for you. Summary Fiber is a type of carbohydrate. It is found in foods such as fruits, vegetables, whole grains, and beans. A high-fiber diet has many benefits. It can help to prevent constipation, lower blood cholesterol, aid weight loss, and reduce your risk of heart disease, diabetes, and certain cancers. Increase your intake of fiber gradually. Increasing fiber too quickly may cause cramping, bloating, and gas. Drink plenty of water while you increase the amount of fiber you consume. The best sources of fiber include whole fruits and vegetables, whole grains, nuts, seeds, and beans. This information is not intended to replace advice given to you by your health care provider. Make sure you discuss any questions you have with your health care provider. Document Revised: 03/03/2020 Document Reviewed: 03/03/2020 Elsevier Patient Education  [employer]. Gastroesophageal Reflux Disease, Adult  Gastroesophageal reflux (GER) happens when acid from the stomach flows up into the tube that connects the mouth and the stomach (esophagus). Normally, food travels down the esophagus and stays in the stomach to be digested. With GER, food and stomach acid sometimes move back up into the esophagus. You may have a disease called gastroesophageal reflux disease (GERD) if the reflux: Happens often. Causes frequent or very bad symptoms. Causes problems such as damage to the esophagus. When this happens, the esophagus becomes sore and swollen. Over time, GERD can make small holes (ulcers) in the lining of the esophagus. What are the causes? This condition is caused by a problem with the muscle between the esophagus and the stomach. When this muscle is weak or not normal, it does not close properly to keep food and acid from coming back up from the stomach. The muscle can be weak because of: Tobacco  use. Pregnancy. Having a certain type of hernia (hiatal hernia). Alcohol use. Certain foods and drinks, such as coffee, chocolate, onions, and peppermint. What increases the risk? Being overweight. Having a disease that affects your connective tissue. Taking NSAIDs, such a ibuprofen. What are the signs or symptoms? Heartburn. Difficult or painful swallowing. The feeling of having a lump in the throat. A bitter taste in the mouth. Bad breath. Having a lot of saliva. Having an upset or bloated stomach. Burping. Chest pain. Different conditions can cause chest pain. Make sure you see your doctor if you have chest pain. Shortness of breath or wheezing. A long-term cough or a cough at night. Wearing away of the surface of teeth (tooth enamel). Weight loss. How is this treated? Making changes to your diet. Taking medicine. Having surgery. Treatment will depend on how bad your symptoms are. Follow these instructions at home: Eating and drinking  Follow a diet as told by your doctor. You may need to avoid foods and drinks such as: Coffee and tea, with or without caffeine. Drinks that contain alcohol. Energy drinks and sports drinks. Bubbly (carbonated) drinks or sodas. Chocolate and cocoa. Peppermint and mint flavorings. Garlic and onions. Horseradish. Spicy and acidic foods. These include peppers, chili powder, curry powder, vinegar, hot sauces, and BBQ sauce. Citrus fruit juices and citrus fruits, such as oranges, lemons, and limes. Tomato-based foods. These include red sauce, chili, salsa, and pizza with red sauce. Fried and fatty foods. These include donuts, french fries, potato chips, and high-fat dressings. High-fat meats. These include  hot dogs, rib eye steak, sausage, ham, and bacon. High-fat dairy items, such as whole milk, butter, and cream cheese. Eat small meals often. Avoid eating large meals. Avoid drinking large amounts of liquid with your meals. Avoid eating  meals during the 2-3 hours before bedtime. Avoid lying down right after you eat. Do not exercise right after you eat. Lifestyle  Do not smoke or use any products that contain nicotine or tobacco. If you need help quitting, ask your doctor. Try to lower your stress. If you need help doing this, ask your doctor. If you are overweight, lose an amount of weight that is healthy for you. Ask your doctor about a safe weight loss goal. General instructions Pay attention to any changes in your symptoms. Take over-the-counter and prescription medicines only as told by your doctor. Do not take aspirin, ibuprofen, or other NSAIDs unless your doctor says it is okay. Wear loose clothes. Do not wear anything tight around your waist. Raise (elevate) the head of your bed about 6 inches (15 cm). You may need to use a wedge to do this. Avoid bending over if this makes your symptoms worse. Keep all follow-up visits. Contact a doctor if: You have new symptoms. You lose weight and you do not know why. You have trouble swallowing or it hurts to swallow. You have wheezing or a cough that keeps happening. You have a hoarse voice. Your symptoms do not get better with treatment. Get help right away if: You have sudden pain in your arms, neck, jaw, teeth, or back. You suddenly feel sweaty, dizzy, or light-headed. You have chest pain or shortness of breath. You vomit and the vomit is green, yellow, or black, or it looks like blood or coffee grounds. You faint. Your poop (stool) is red, bloody, or black. You cannot swallow, drink, or eat. These symptoms may represent a serious problem that is an emergency. Do not wait to see if the symptoms will go away. Get medical help right away. Call your local emergency services (911 in the [place] Do not drive yourself to the hospital. Summary If a person has gastroesophageal reflux disease (GERD), food and stomach acid move back up into the esophagus and cause symptoms or  problems such as damage to the esophagus. Treatment will depend on how bad your symptoms are. Follow a diet as told by your doctor. Take all medicines only as told by your doctor. This information is not intended to replace advice given to you by your health care provider. Make sure you discuss any questions you have with your health care provider. Document Revised: 05/09/2020 Document Reviewed: 05/09/2020 Elsevier Patient Education  [place].

## 2022-06-07 ENCOUNTER — Telehealth: Payer: Self-pay

## 2022-06-07 NOTE — Telephone Encounter (Signed)
[employer] denied patient EGD for her. I schedule a peer to peer for today at 10:45am. They will call your cell phone.

## 2022-06-07 NOTE — Telephone Encounter (Signed)
Did peer to peer with Dr. Lajuan Lines.  According to her, since patient does not have any red flags and she does not have 3 out of 5 risk factors for Barrett's esophagus, EGD is not approved by her insurance.  The patient will try omeprazole 40 mg once a day before breakfast for 8 weeks.  If her symptoms are persistent, she should let us know and we will proceed with upper endoscopy   Caryl Pina  Please relay above information with patient and cancel her EGD for now  Sherri Sear, MD

## 2022-06-07 NOTE — Telephone Encounter (Signed)
Patient verbalized understanding and will let us know if symptoms. Called Dana in Endo and canceled the procedure

## 2022-06-18 ENCOUNTER — Encounter: Payer: Self-pay | Admitting: Gastroenterology

## 2022-06-18 NOTE — Telephone Encounter (Signed)
Give Linzess 145 samples

## 2022-06-19 ENCOUNTER — Telehealth: Payer: Self-pay

## 2022-06-19 MED ORDER — LINACLOTIDE 145 MCG PO CAPS: 145.0000 ug | ORAL_CAPSULE | Freq: Every day | ORAL | 5 refills | Status: DC

## 2022-06-19 NOTE — Telephone Encounter (Signed)
Insurance approved medication from 06/19/22 to 06/20/23

## 2022-06-19 NOTE — Telephone Encounter (Signed)
Submitted PA through cover my meds for Linzess 163mg. Waiting on response from insurance company

## 2022-06-21 ENCOUNTER — Ambulatory Visit: Admit: 2022-06-21 | Payer: Managed Care, Other (non HMO) | Admitting: Gastroenterology

## 2022-06-21 SURGERY — ESOPHAGOGASTRODUODENOSCOPY (EGD) WITH PROPOFOL
Anesthesia: General

## 2022-07-03 ENCOUNTER — Other Ambulatory Visit: Payer: Self-pay | Admitting: Gastroenterology

## 2022-07-03 DIAGNOSIS — K219 Gastro-esophageal reflux disease without esophagitis: Secondary | ICD-10-CM

## 2022-10-11 ENCOUNTER — Other Ambulatory Visit: Payer: Self-pay | Admitting: Family Medicine

## 2022-10-11 DIAGNOSIS — E039 Hypothyroidism, unspecified: Secondary | ICD-10-CM

## 2022-11-06 ENCOUNTER — Other Ambulatory Visit: Payer: Self-pay | Admitting: Internal Medicine

## 2022-11-06 DIAGNOSIS — J302 Other seasonal allergic rhinitis: Secondary | ICD-10-CM

## 2022-11-08 NOTE — Telephone Encounter (Signed)
Requested Prescriptions  Pending Prescriptions Disp Refills   levocetirizine (XYZAL) 5 MG tablet [Pharmacy Med Name: LEVOCETIRIZINE 5MG TABLETS] 45 tablet 3    Sig: TAKE 1/2 TABLET BY MOUTH IN THE EVENING     Ear, Nose, and Throat:  Antihistamines - levocetirizine dihydrochloride Passed - 11/06/2022  9:19 PM      Passed - Cr in normal range and within 360 days    Creatinine, Ser  Date Value Ref Range Status  01/29/2022 0.98 0.57 - 1.00 mg/dL Final         Passed - eGFR is 10 or above and within 360 days    GFR calc Af Amer  Date Value Ref Range Status  11/09/2020 104 >59 mL/min/1.73 Final    Comment:    **In accordance with recommendations from the NKF-ASN Task force,**   [facility] is in the process of updating its eGFR calculation to the   2021 CKD-EPI creatinine equation that estimates kidney function   without a race variable.    GFR calc non Af Amer  Date Value Ref Range Status  11/09/2020 90 >59 mL/min/1.73 Final   eGFR  Date Value Ref Range Status  01/29/2022 71 >59 mL/min/1.73 Final         Passed - Valid encounter within last 12 months    Recent Outpatient Visits           5 months ago Cellulitis of left lower extremity   [facility] Delsa Grana, PA-C   1 year ago Screening for colon cancer   [place], DO   1 year ago Seasonal allergic rhinitis, unspecified trigger   [facility] Delsa Grana, PA-C   2 years ago Adult general medical exam   [facility] Delsa Grana, PA-C   2 years ago Herpes zoster without complication   [facility] Delsa Grana, [place]       Future Appointments             Tomorrow Teodora Medici, [facility], [facility]

## 2022-11-08 NOTE — Progress Notes (Signed)
Name: Brenda Price   MRN: [id number]    DOB: 1971-01-17   Date:11/09/2022       Progress Note  Subjective  Chief Complaint  Chief Complaint  Patient presents with   Annual Exam    HPI  Patient presents for annual CPE and follow up on chronic medical conditions.   Hypothyroidism: -Medications: Levothyroxine 50 mcg -Patient is compliant with the above medication (s) at the above dose and reports no medication side effects.  -Denies weight changes, cold/heat intolerance, skin changes, anxiety/palpitations  -Last TSH: 8/22 3.040  Seasonal Allergies/Asthma: -Currently on Albuterol PRN, Xyzal 5 mg at night -Allergies usually worse in spring but bothering her now, perhaps due to warmer weather  Diet: well balanced, trying to encorporate more fruits in diet Exercise: brisk walks 5 days a week   Last Eye Exam: UTD, getting appointment soon  Last Dental Exam: UTD  [place] Office Visit from 06/01/2021 in [facility]  AUDIT-C Score 0      Depression: Phq 9 is  negative    05/25/2022    8:10 AM 11/08/2021    8:50 AM 06/01/2021    3:51 PM 10/28/2020   12:03 PM 08/15/2020    9:53 AM  Depression screen PHQ 2/9  Decreased Interest 0 0 0 0 0  Down, Depressed, Hopeless 0 0 0 0 0  PHQ - 2 Score 0 0 0 0 0  Altered sleeping 0 0     Tired, decreased energy 0 0     Change in appetite 0 0     Feeling bad or failure about yourself  0 0     Trouble concentrating 0 0     Moving slowly or fidgety/restless 0 0     Suicidal thoughts 0 0     PHQ-9 Score 0 0     Difficult doing work/chores Not difficult at all Not difficult at all      Hypertension: BP Readings from Last 3 Encounters:  11/09/22 118/78  06/04/22 132/83  12/29/21 123/83   Obesity: Wt Readings from Last 3 Encounters:  11/09/22 139 lb 8 oz (63.3 kg)  06/04/22 139 lb 8 oz (63.3 kg)  12/29/21 138 lb (62.6 kg)   BMI Readings from Last 3 Encounters:  11/09/22 23.95 kg/m  06/04/22 23.95 kg/m   12/29/21 23.69 kg/m     Vaccines:   HPV: n/a Tdap: 2013, due Shingrix: Qualifies  Pneumonia: n/a Flu: UTD COVID-19: 3 vaccines    Hep C Screening: 2019  STD testing and prevention (HIV/chl/gon/syphilis): no concerns  Intimate partner violence: negative screen  Menstrual History/LMP/Abnormal Bleeding: hysterectomy, years ago  Discussed importance of follow up if any post-menopausal bleeding: yes   Breast cancer:  - Last Mammogram: 12/12/21, Birads-1  Osteoporosis Prevention : Discussed high calcium and vitamin D supplementation, weight bearing exercises Bone density :not applicable   Cervical cancer screening: 2022, negative   Skin cancer: Discussed monitoring for atypical lesions  Colorectal cancer: colonoscopy 12/29/21, repeat in 5 years   Lung cancer:  Low Dose CT Chest recommended if Age 60-80 years, 20 pack-year currently smoking OR have quit w/in 15years. Patient does not qualify for screen   ECG: 04/08/20  Advanced Care Planning: A voluntary discussion about advance care planning including the explanation and discussion of advance directives.  Discussed health care proxy and Living will, and the patient was able to identify a health care proxy as first husband Lillyrose Reitan and second mother Brewing technologist.  Patient  does not have a living will and power of attorney of health care.  Lipids: Lab Results  Component Value Date   CHOL 227 (H) 01/29/2022   CHOL 213 (H) 11/09/2020   CHOL 195 11/03/2019   Lab Results  Component Value Date   HDL 84 01/29/2022   HDL 81 11/09/2020   HDL 79 11/03/2019   Lab Results  Component Value Date   LDLCALC 135 (H) 01/29/2022   LDLCALC 122 (H) 11/09/2020   LDLCALC 105 (H) 11/03/2019   Lab Results  Component Value Date   TRIG 49 01/29/2022   TRIG 57 11/09/2020   TRIG 59 11/03/2019   Lab Results  Component Value Date   CHOLHDL 2.7 01/29/2022   CHOLHDL 2.6 11/09/2020   CHOLHDL 2.5 11/03/2019   No results found for:  "LDLDIRECT"  Glucose: Glucose  Date Value Ref Range Status  01/29/2022 72 70 - 99 mg/dL Final  06/13/2021 75 65 - 99 mg/dL Final  11/09/2020 69 65 - 99 mg/dL Final   Glucose, Bld  Date Value Ref Range Status  04/08/2020 97 70 - 99 mg/dL Final    Comment:    Glucose reference range applies only to samples taken after fasting for at least 8 hours.  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 Dr. Norton Blizzard [facility]   LAPAROSCOPY Right 03/03/2016   Procedure:  diagnostic laparoscopy and right oophrectomy;  Surgeon: Aletha Halim, MD;  Location: [facility];  Service: Gynecology;  Laterality: Right;    Family History  Problem Relation Age of Onset   Heart attack Father    Arthritis Mother    Hyperlipidemia Mother    Thyroid disease Brother    Breast cancer Neg Hx     Social History   Socioeconomic History   Marital status: Married    Spouse name: Gerald Stabs   Number of children: 2   Years of education: Not on file   Highest education level: Not on file   Occupational History   Not on file  Tobacco Use   Smoking status: Never   Smokeless tobacco: Never  Vaping Use   Vaping Use: Never used  Substance and Sexual Activity   Alcohol use: Yes    Comment: occasional   Drug use: No   Sexual activity: Yes    Partners: Female    Birth control/protection: None    Comment: Hysterectomy  Other Topics Concern   Not on file  Social History Narrative   Not on file   Social Determinants of Health   Financial  Resource Strain: Low Risk  (11/08/2021)   Overall Financial Resource Strain (CARDIA)    Difficulty of Paying Living Expenses: Not hard at all  Food Insecurity: No Food Insecurity (11/08/2021)   Hunger Vital Sign    Worried About Running Out of Food in the Last Year: Never true    Ran Out of Food in the Last Year: Never true  Transportation Needs: No Transportation Needs (11/08/2021)   PRAPARE - Hydrologist (Medical): No    Lack of Transportation (Non-Medical): No  Physical Activity: Insufficiently Active (11/08/2021)   Exercise Vital Sign    Days of Exercise per Week: 6 days    Minutes of Exercise per Session: 20 min  Stress: Stress Concern Present (11/08/2021)   [place]    Feeling of Stress : To some extent  Social Connections: Moderately Integrated (11/08/2021)   Social Connection and Isolation Panel [NHANES]    Frequency of Communication with Friends and Family: More than three times a week    Frequency of Social Gatherings with Friends and Family: More than three times a week    Attends Religious Services: 1 to 4 times per year    Active Member of [employer] or Organizations: No    Attends Archivist Meetings: Never    Marital Status: Married  Human resources officer Violence: Not At Risk (11/08/2021)   Humiliation, Afraid, Rape, and Kick questionnaire    Fear of Current or Ex-Partner: No    Emotionally Abused: No    Physically  Abused: No    Sexually Abused: No     Current Outpatient Medications:    albuterol (PROVENTIL) (2.5 MG/3ML) 0.083% nebulizer solution, Take 3 mLs (2.5 mg total) by nebulization every 6 (six) hours as needed for wheezing or shortness of breath., Disp: 150 mL, Rfl: 1   albuterol (VENTOLIN HFA) 108 (90 Base) MCG/ACT inhaler, USE 2 INHALATIONS BY MOUTH  EVERY 6 HOURS AS NEEDED FOR WHEEZING OR SHORTNESS OF  BREATH, Disp: 34 g, Rfl: 3   baclofen (LIORESAL) 10 MG tablet, Take 1 tablet (10 mg total) by mouth 3 (three) times daily as needed for muscle spasms., Disp: 30 each, Rfl: 5   Galcanezumab-gnlm 120 MG/ML SOAJ, Inject 120 mg into the skin every 30 (thirty) days., Disp: , Rfl:    ketorolac (TORADOL) 10 MG tablet, Take 10 mg by mouth every 6 (six) hours as needed., Disp: , Rfl:    levocetirizine (XYZAL) 5 MG tablet, TAKE 1/2 TABLET BY MOUTH IN THE EVENING, Disp: 45 tablet, Rfl: 1   levothyroxine (SYNTHROID) 50 MCG tablet, TAKE 1 TABLET BY MOUTH DAILY, Disp: 90 tablet, Rfl: 0   linaclotide (LINZESS) 145 MCG CAPS capsule, Take 1 capsule (145 mcg total) by mouth daily before breakfast., Disp: 30 capsule, Rfl: 5   montelukast (SINGULAIR) 10 MG tablet, TAKE 1 TABLET(10 MG) BY MOUTH DAILY, Disp: 90 tablet, Rfl: 3   nortriptyline (PAMELOR) 10 MG capsule, Take 10 mg by mouth at bedtime., Disp: , Rfl:    omeprazole (PRILOSEC) 40 MG capsule, TAKE 1 CAPSULE(40 MG) BY MOUTH DAILY BEFORE BREAKFAST, Disp: 30 capsule, Rfl: 1   ondansetron (ZOFRAN) 4 MG tablet, , Disp: , Rfl:    OXcarbazepine (TRILEPTAL) 150 MG tablet, Take 150 mg by mouth 2 (two) times daily., Disp: , Rfl:    pregabalin (LYRICA) 50 MG capsule, , Disp: , Rfl:    promethazine (PHENERGAN) 12.5 MG tablet, Take 12.5 mg by  mouth., Disp: , Rfl:    triamcinolone ointment (KENALOG) 0.1 %, Apply 1 application topically 2 (two) times daily., Disp: 30 g, Rfl: 2  Allergies  Allergen Reactions   Aspirin Other (See Comments)    Causes asthma to act up     Erythromycin Other (See Comments)    Stomach cramping      Review of Systems  All other systems reviewed and are negative.   Objective  Vitals:   11/09/22 0901  BP: 118/78  Pulse: 78  Resp: 18  SpO2: 98%  Weight: 139 lb 8 oz (63.3 kg)  Height: '5\' 4"'$  (1.626 m)    Body mass index is 23.95 kg/m.  Physical Exam Constitutional:      Appearance: Normal appearance.  HENT:     Head: Normocephalic and atraumatic.  Eyes:     Conjunctiva/sclera: Conjunctivae normal.  Cardiovascular:     Rate and Rhythm: Normal rate and regular rhythm.  Pulmonary:     Effort: Pulmonary effort is normal.     Breath sounds: Normal breath sounds.  Skin:    General: Skin is warm and dry.  Neurological:     General: No focal deficit present.     Mental Status: She is alert. Mental status is at baseline.  Psychiatric:        Mood and Affect: Mood normal.        Behavior: Behavior normal.     No results found for this or any previous visit (from the past 2160 hour(s)).   Fall Risk:    05/25/2022    8:09 AM 11/08/2021    8:47 AM 06/01/2021    3:51 PM 10/28/2020   12:03 PM 08/15/2020    9:52 AM  Fall Risk   Falls in the past year? 0 0 0 1 0  Number falls in past yr: 0 0 0 0 0  Injury with Fall? 0 0 0 1 0  Risk for fall due to : No Fall Risks      Follow up Falls prevention discussed;Education provided  Falls evaluation completed Falls evaluation completed Falls evaluation completed    Assessment & Plan  1. Annual physical exam/Lipid screening/Vitamin D deficiency: Annual labs ordered to have done in 6 months while fasting.   - CBC w/Diff/Platelet - Lipid Profile - Vitamin D (25 hydroxy) - Comprehensive Metabolic Panel (CMET)  2. Adult hypothyroidism: Stable, doing well on Levothyroxine 50 mcg. Recheck TSH.  - TSH  3. Asthma, unspecified asthma severity, unspecified whether complicated, unspecified whether persistent: Stable, needing refills on Albuterol to use PRN.  -  albuterol (VENTOLIN HFA) 108 (90 Base) MCG/ACT inhaler; USE 2 INHALATIONS BY MOUTH  EVERY 6 HOURS AS NEEDED FOR WHEEZING OR SHORTNESS OF  BREATH  Dispense: 34 g; Refill: 3  4. Seasonal allergic rhinitis, unspecified trigger: Worse lately, had been taking 0.5 tablet at night but increased to a whole tablet, prescription up dated.   - levocetirizine (XYZAL) 5 MG tablet; TAKE 11 TABLET BY MOUTH IN THE EVENING  Dispense: 45 tablet; Refill: 1  5. Encounter for screening mammogram for malignant neoplasm of breast: Mammogram ordered.   - MM 3D SCREEN BREAST BILATERAL; Future  6. Need for Tdap vaccination: Tdap administered.   - Tdap vaccine greater than or equal to 7yo IM  -USPSTF grade A and B recommendations reviewed with patient; age-appropriate recommendations, preventive care, screening tests, etc discussed and encouraged; healthy living encouraged; see AVS for patient education given to patient -Discussed importance of 150  minutes of physical activity weekly, eat two servings of fish weekly, eat one serving of tree nuts ( cashews, pistachios, pecans, almonds.Marland Kitchen) every other day, eat 6 servings of fruit/vegetables daily and drink plenty of water and avoid sweet beverages.   -Reviewed Health Maintenance: Yes.

## 2022-11-09 ENCOUNTER — Ambulatory Visit (INDEPENDENT_AMBULATORY_CARE_PROVIDER_SITE_OTHER): Payer: Managed Care, Other (non HMO) | Admitting: Internal Medicine

## 2022-11-09 ENCOUNTER — Encounter: Payer: Self-pay | Admitting: Internal Medicine

## 2022-11-09 VITALS — BP 118/78 | HR 78 | Resp 18 | Ht 64.0 in | Wt 139.5 lb

## 2022-11-09 DIAGNOSIS — Z13220 Encounter for screening for lipoid disorders: Secondary | ICD-10-CM

## 2022-11-09 DIAGNOSIS — Z1231 Encounter for screening mammogram for malignant neoplasm of breast: Secondary | ICD-10-CM

## 2022-11-09 DIAGNOSIS — E039 Hypothyroidism, unspecified: Secondary | ICD-10-CM | POA: Diagnosis not present

## 2022-11-09 DIAGNOSIS — J302 Other seasonal allergic rhinitis: Secondary | ICD-10-CM

## 2022-11-09 DIAGNOSIS — E559 Vitamin D deficiency, unspecified: Secondary | ICD-10-CM

## 2022-11-09 DIAGNOSIS — Z0000 Encounter for general adult medical examination without abnormal findings: Secondary | ICD-10-CM

## 2022-11-09 DIAGNOSIS — J45909 Unspecified asthma, uncomplicated: Secondary | ICD-10-CM | POA: Diagnosis not present

## 2022-11-09 DIAGNOSIS — Z23 Encounter for immunization: Secondary | ICD-10-CM | POA: Diagnosis not present

## 2022-11-09 MED ORDER — LEVOCETIRIZINE DIHYDROCHLORIDE 5 MG PO TABS: ORAL_TABLET | ORAL | 1 refills | Status: DC

## 2022-11-09 MED ORDER — ALBUTEROL SULFATE HFA 108 (90 BASE) MCG/ACT IN AERS: INHALATION_SPRAY | RESPIRATORY_TRACT | 3 refills | Status: DC

## 2022-11-09 NOTE — Patient Instructions (Addendum)
It was great seeing you today!  Plan discussed at today's visit: -Labs ordered, please obtain in 6 months while fasting about 1 week prior to follow up -Medications refilled -Tdap today  Follow up in: 6 months   Take care and let us know if you have any questions or concerns prior to your next visit.  Dr. Rosana Berger

## 2022-11-14 ENCOUNTER — Encounter: Payer: Self-pay | Admitting: Internal Medicine

## 2022-11-16 ENCOUNTER — Other Ambulatory Visit: Payer: Self-pay | Admitting: Internal Medicine

## 2022-11-16 DIAGNOSIS — J45909 Unspecified asthma, uncomplicated: Secondary | ICD-10-CM

## 2022-11-16 DIAGNOSIS — J302 Other seasonal allergic rhinitis: Secondary | ICD-10-CM

## 2022-11-16 MED ORDER — MONTELUKAST SODIUM 10 MG PO TABS: 10.0000 mg | ORAL_TABLET | Freq: Every day | ORAL | 3 refills | Status: DC

## 2022-11-21 ENCOUNTER — Other Ambulatory Visit: Payer: Self-pay | Admitting: Family Medicine

## 2022-11-21 DIAGNOSIS — E039 Hypothyroidism, unspecified: Secondary | ICD-10-CM

## 2022-12-12 ENCOUNTER — Other Ambulatory Visit: Payer: Self-pay | Admitting: Internal Medicine

## 2022-12-12 DIAGNOSIS — J45909 Unspecified asthma, uncomplicated: Secondary | ICD-10-CM

## 2022-12-12 NOTE — Telephone Encounter (Signed)
Requested Prescriptions  Pending Prescriptions Disp Refills   albuterol (VENTOLIN HFA) 108 (90 Base) MCG/ACT inhaler [Pharmacy Med Name: ALBUTEROL HFA 90MCG/ACT (PV)] 34 g 3    Sig: USE 2 INHALATIONS BY MOUTH EVERY 4 TO 6 HOURS AS NEEDED FOR  WHEEZING     Pulmonology:  Beta Agonists 2 Passed - 12/12/2022  1:47 PM      Passed - Last BP in normal range    BP Readings from Last 1 Encounters:  11/09/22 118/78         Passed - Last Heart Rate in normal range    Pulse Readings from Last 1 Encounters:  11/09/22 78         Passed - Valid encounter within last 12 months    Recent Outpatient Visits           1 month ago Annual physical exam   [facility] Teodora Medici, DO   6 months ago Cellulitis of left lower extremity   [facility] Delsa Grana, PA-C   1 year ago Screening for colon cancer   [place], DO   1 year ago Seasonal allergic rhinitis, unspecified trigger   [facility] Delsa Grana, PA-C   2 years ago Adult general medical exam   [facility] Delsa Grana, PA-C       Future Appointments             In 5 months Delsa Grana, [facility], [facility]

## 2022-12-28 ENCOUNTER — Other Ambulatory Visit: Payer: Self-pay | Admitting: Family Medicine

## 2022-12-28 ENCOUNTER — Telehealth: Payer: Self-pay | Admitting: Family Medicine

## 2022-12-28 DIAGNOSIS — E039 Hypothyroidism, unspecified: Secondary | ICD-10-CM

## 2022-12-28 NOTE — Telephone Encounter (Signed)
Spoke with patient and informed her that thyroid medication was denied due to having a TSH lab test done since 2022. Patient thought that she had TSH done in March of 2023, but I informed patient that particular test was not done.  I told patient that the current lab order that is on file for her has a TSH included.  Patient stated that she would go ahead and have labs done through [facility].

## 2023-01-08 ENCOUNTER — Telehealth: Payer: Self-pay

## 2023-01-08 NOTE — Telephone Encounter (Signed)
Pt aware to pick up labs

## 2023-01-08 NOTE — Telephone Encounter (Signed)
I see pt seen Dr.Andrews in December 2023. Is there any additional labs?   Copied from [place] [id number]. Topic: General - Other >> Jan 08, 2023  8:08 AM Everette C wrote: Reason for CRM: The patient has been told by their PCP that additional labwork has been ordered for them   The patient works for [employer] and has requested that a printed form with their lab orders be created and ready for pick up   The patient works for [employer] and needs the orders printed

## 2023-01-11 LAB — LIPID PANEL
Chol/HDL Ratio: 2.6 ratio (ref 0.0–4.4)
Cholesterol, Total: 255 mg/dL — ABNORMAL HIGH (ref 100–199)
HDL: 98 mg/dL (ref >39)
LDL Chol Calc (NIH): 145 mg/dL — ABNORMAL HIGH (ref 0–99)
Triglycerides: 72 mg/dL (ref 0–149)
VLDL Cholesterol Cal: 12 mg/dL (ref 5–40)

## 2023-01-11 LAB — CBC WITH DIFFERENTIAL/PLATELET
Basophils Absolute: 0 10*3/uL (ref 0.0–0.2)
Basos: 1 %
EOS (ABSOLUTE): 0.2 10*3/uL (ref 0.0–0.4)
Eos: 6 %
Hematocrit: 39.9 % (ref 34.0–46.6)
Hemoglobin: 13.3 g/dL (ref 11.1–15.9)
Immature Grans (Abs): 0 10*3/uL (ref 0.0–0.1)
Immature Granulocytes: 0 %
Lymphocytes Absolute: 1.5 10*3/uL (ref 0.7–3.1)
Lymphs: 41 %
MCH: 29.4 pg (ref 26.6–33.0)
MCHC: 33.3 g/dL (ref 31.5–35.7)
MCV: 88 fL (ref 79–97)
Monocytes Absolute: 0.4 10*3/uL (ref 0.1–0.9)
Monocytes: 11 %
Neutrophils Absolute: 1.5 10*3/uL (ref 1.4–7.0)
Neutrophils: 41 %
Platelets: 228 10*3/uL (ref 150–450)
RBC: 4.53 x10E6/uL (ref 3.77–5.28)
RDW: 13.2 % (ref 11.7–15.4)
WBC: 3.7 10*3/uL (ref 3.4–10.8)

## 2023-01-11 LAB — VITAMIN D 25 HYDROXY (VIT D DEFICIENCY, FRACTURES): Vit D, 25-Hydroxy: 45.8 ng/mL (ref 30.0–100.0)

## 2023-01-11 LAB — COMPREHENSIVE METABOLIC PANEL
ALT: 17 IU/L (ref 0–32)
AST: 26 IU/L (ref 0–40)
Albumin/Globulin Ratio: 2.2 (ref 1.2–2.2)
Albumin: 4.6 g/dL (ref 3.9–4.9)
Alkaline Phosphatase: 83 IU/L (ref 44–121)
BUN/Creatinine Ratio: 8 — ABNORMAL LOW (ref 9–23)
BUN: 7 mg/dL (ref 6–24)
Bilirubin Total: 0.4 mg/dL (ref 0.0–1.2)
CO2: 25 mmol/L (ref 20–29)
Calcium: 9.1 mg/dL (ref 8.7–10.2)
Chloride: 99 mmol/L (ref 96–106)
Creatinine, Ser: 0.84 mg/dL (ref 0.57–1.00)
Globulin, Total: 2.1 g/dL (ref 1.5–4.5)
Glucose: 72 mg/dL (ref 70–99)
Potassium: 4.4 mmol/L (ref 3.5–5.2)
Sodium: 138 mmol/L (ref 134–144)
Total Protein: 6.7 g/dL (ref 6.0–8.5)
eGFR: 85 mL/min/{1.73_m2} (ref >59)

## 2023-01-11 LAB — TSH: TSH: 3.28 u[IU]/mL (ref 0.450–4.500)

## 2023-01-18 ENCOUNTER — Other Ambulatory Visit: Payer: Self-pay | Admitting: Family Medicine

## 2023-01-18 ENCOUNTER — Encounter: Payer: Self-pay | Admitting: Internal Medicine

## 2023-01-18 DIAGNOSIS — E039 Hypothyroidism, unspecified: Secondary | ICD-10-CM

## 2023-03-07 ENCOUNTER — Ambulatory Visit
Admission: RE | Admit: 2023-03-07 | Discharge: 2023-03-07 | Disposition: A | Discharge status: Home / Self Care | Payer: Managed Care, Other (non HMO) | Source: Ambulatory Visit | Attending: Internal Medicine | Admitting: Internal Medicine

## 2023-03-07 DIAGNOSIS — Z1231 Encounter for screening mammogram for malignant neoplasm of breast: Secondary | ICD-10-CM | POA: Insufficient documentation

## 2023-03-14 ENCOUNTER — Other Ambulatory Visit: Payer: Self-pay | Admitting: Internal Medicine

## 2023-03-14 DIAGNOSIS — E039 Hypothyroidism, unspecified: Secondary | ICD-10-CM

## 2023-03-15 NOTE — Telephone Encounter (Signed)
Rx was sent to pharmacy on 01/18/23 #90/0.   Requested Prescriptions  Refused Prescriptions Disp Refills   levothyroxine (SYNTHROID) 50 MCG tablet [Pharmacy Med Name: Levothyroxine Sodium 50 MCG Oral Tablet] 90 tablet 3    Sig: TAKE 1 TABLET BY MOUTH DAILY     Endocrinology:  Hypothyroid Agents Passed - 03/14/2023 10:36 PM      Passed - TSH in normal range and within 360 days    TSH  Date Value Ref Range Status  01/10/2023 3.280 0.450 - 4.500 uIU/mL Final         Passed - Valid encounter within last 12 months    Recent Outpatient Visits           4 months ago Annual physical exam   [facility] Margarita Mail, DO   9 months ago Cellulitis of left lower extremity   [facility] Danelle Berry, PA-C   1 year ago Screening for colon cancer   [facility] Margarita Mail, DO   1 year ago Seasonal allergic rhinitis, unspecified trigger   [facility] Danelle Berry, PA-C   2 years ago Adult general medical exam   [facility] Danelle Berry, PA-C       Future Appointments             In 1 month Danelle Berry, PA-C [facility]   In 3 months Vanga, Loel Dubonnet, MD [facility] [place] [facility]

## 2023-03-25 ENCOUNTER — Other Ambulatory Visit: Payer: Self-pay | Admitting: Student

## 2023-03-25 DIAGNOSIS — G43109 Migraine with aura, not intractable, without status migrainosus: Secondary | ICD-10-CM

## 2023-05-13 ENCOUNTER — Ambulatory Visit: Payer: Managed Care, Other (non HMO) | Admitting: Family Medicine

## 2023-05-17 ENCOUNTER — Ambulatory Visit: Payer: Self-pay

## 2023-05-17 ENCOUNTER — Ambulatory Visit
Admission: RE | Admit: 2023-05-17 | Discharge: 2023-05-17 | Disposition: A | Discharge status: Home / Self Care | Payer: Managed Care, Other (non HMO) | Source: Ambulatory Visit | Attending: Internal Medicine | Admitting: Internal Medicine

## 2023-05-17 VITALS — BP 146/92 | HR 77 | Temp 98.6°F | Resp 16

## 2023-05-17 DIAGNOSIS — K5900 Constipation, unspecified: Secondary | ICD-10-CM

## 2023-05-17 NOTE — ED Provider Notes (Signed)
Renaldo Fiddler    CSN: [id number] Arrival date & time: 05/17/23  1414      History   Chief Complaint Chief Complaint  Patient presents with   Constipation    Suffering from severe constipation over last 1-2 weeks. Only BMs are small loose almost diarrhea. Feel very full all the time and bending down or over often causes severe reflux. Also have stomach bloating with pressure. - Entered by patient    HPI Brenda Price is a 52 y.o. female.    Constipation   Patient is accompanied by a family member.  Presents with complaint of "severe" constipation over the last 1 to 2 weeks.  She endorses previous history of constipation which she generally treats with Senokot.  She does report some bowel movements which are small, loose, almost diarrhea.  She endorses feeling of fullness all the time.  Reports feeling of reflux when she bends over or puts pressure on her abdomen.  Reports "bloating".  Using Dulcolax, milk of magnesium, Senokot without relief.  Review of the patient's chart indicates previous treatment with Linzess prescribed for management of chronic constipation and GERD.  She reported to that gastro provider that she was taking Senokot daily.  She states that she had a colonoscopy which was unremarkable except that she was told she should avoid senna because it was staining the inside of her bowel.  She reports drinking about 64 ounces of water daily.  She does say that she eats a lot of processed foods and deli meats, does not eat a lot of fruits or vegetables.  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   Headache, migraine 09/29/2015   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 Dr. Tretha Sciara OBGYN   LAPAROSCOPY Right 03/03/2016   Procedure:  diagnostic laparoscopy and right oophrectomy;  Surgeon: Firthcliffe Bing, MD;  Location: [facility] ORS;  Service: Gynecology;  Laterality: Right;    OB History     Gravida  2   Para  1   Term      Preterm      AB      Living         SAB      IAB      Ectopic      Multiple  Live Births               Home Medications    Prior to Admission medications   Medication Sig Start Date End Date Taking? Authorizing Provider  albuterol (PROVENTIL) (2.5 MG/3ML) 0.083% nebulizer solution Take 3 mLs (2.5 mg total) by nebulization every 6 (six) hours as needed for wheezing or shortness of breath. 10/22/19   Doren Custard, FNP  albuterol (VENTOLIN HFA) 108 (90 Base) MCG/ACT inhaler USE 2 INHALATIONS BY MOUTH EVERY 4 TO 6 HOURS AS NEEDED FOR  WHEEZING 12/12/22   Margarita Mail, DO  baclofen (LIORESAL) 10 MG tablet Take 1 tablet (10 mg total) by mouth 3 (three) times daily as needed for muscle spasms. 10/11/17   Trey Sailors, PA-C  Galcanezumab-gnlm 120 MG/ML SOAJ Inject 120 mg into  the skin every 30 (thirty) days.    [provider]  ketorolac (TORADOL) 10 MG tablet Take 10 mg by mouth every 6 (six) hours as needed. 10/16/19   [provider]  levocetirizine (XYZAL) 5 MG tablet TAKE 11 TABLET BY MOUTH IN THE EVENING 11/09/22   Margarita Mail, DO  levothyroxine (SYNTHROID) 50 MCG tablet TAKE 1 TABLET BY MOUTH DAILY 01/18/23   Margarita Mail, DO  linaclotide [facility]) 145 MCG CAPS capsule Take 1 capsule (145 mcg total) by mouth daily before breakfast. 06/19/22   Vanga, Loel Dubonnet, MD  montelukast (SINGULAIR) 10 MG tablet Take 1 tablet (10 mg total) by mouth at bedtime. 11/16/22   Margarita Mail, DO  nortriptyline (PAMELOR) 10 MG capsule Take 10 mg by mouth at bedtime.    [provider]  omeprazole (PRILOSEC) 40 MG capsule TAKE 1 CAPSULE(40 MG) BY MOUTH DAILY BEFORE BREAKFAST 07/03/22   Toney Reil, MD  ondansetron [facility]) 4 MG tablet  10/16/19   [provider]  OXcarbazepine (TRILEPTAL) 150 MG tablet Take 150 mg by mouth 2 (two) times daily. 03/31/21   [provider]  pregabalin (LYRICA) 50 MG capsule  10/07/17   [provider]  promethazine (PHENERGAN) 12.5 MG tablet Take 12.5 mg by mouth. 10/16/19   [provider]  triamcinolone ointment (KENALOG) 0.1 % Apply 1 application topically 2 (two) times daily. 10/28/20   Danelle Berry, PA-C    Family History Family History  Problem Relation Age of Onset   Heart attack Father    Arthritis Mother    Hyperlipidemia Mother    Thyroid disease Brother    Breast cancer Neg Hx     Social History Social History   Tobacco Use   Smoking status: Never   Smokeless tobacco: Never  Vaping Use   Vaping Use: Never used  Substance Use Topics   Alcohol use: Yes    Comment: occasional   Drug use: No     Allergies   Aspirin and Erythromycin   Review of Systems Review of Systems  Gastrointestinal:  Positive for constipation.     Physical  Exam Triage Vital Signs ED Triage Vitals  Enc Vitals Group     BP 05/17/23 1420 (!) 146/92     Pulse Rate 05/17/23 1420 77     Resp 05/17/23 1420 16     Temp 05/17/23 1420 98.6 F (37 C)     Temp Source 05/17/23 1420 Oral     SpO2 05/17/23 1420 100 %     Weight --      Height --      Head Circumference --      Peak  Flow --      Pain Score 05/17/23 1422 3     Pain Loc --      Pain Edu? --      Excl. in GC? --    No data found.  Updated Vital Signs BP (!) 146/92 (BP Location: Left Arm)   Pulse 77   Temp 98.6 F (37 C) (Oral)   Resp 16   SpO2 100%   Visual Acuity Right Eye Distance:   Left Eye Distance:   Bilateral Distance:    Right Eye Near:   Left Eye Near:    Bilateral Near:     Physical Exam Constitutional:      Appearance: Normal appearance.  Abdominal:     General: Bowel sounds are normal.     Palpations: Abdomen is soft.  Skin:    General: Skin is warm and dry.  Neurological:     General: No focal deficit present.     Mental Status: She is alert and oriented to person, place, and time.  Psychiatric:        Mood and Affect: Mood normal.        Behavior: Behavior normal.      UC Treatments / Results  Labs (all labs ordered are listed, but only abnormal results are displayed) Labs Reviewed - No data to display  EKG   Radiology No results found.  Procedures Procedures (including critical care time)  Medications Ordered in UC Medications - No data to display  Initial Impression / Assessment and Plan / UC Course  I have reviewed the triage vital signs and the nursing notes.  Pertinent labs & imaging results that were available during my care of the patient were reviewed by me and considered in my medical decision making (see chart for details).   Brenda Price is a 52 y.o. female presenting with constipation. Patient is afebrile without recent antipyretics, satting well on room air. Overall is well appearing, well hydrated, without  respiratory distress.  Abdomen is soft with some left lower quadrant tenderness.  Bowels are normal active.  Reviewed relevant chart history.   Unclear etiology for her symptoms, suspect IBS.  Recommending gradual increase in dietary fiber to recommended 25 to 30 g/day.  Suggested use of osmotic laxative such as MiraLAX powder rather than stimulant laxative.  Follow-up with GI provider.  Counseled patient on potential for adverse effects with medications prescribed/recommended today, ER and return-to-clinic precautions discussed, patient verbalized understanding and agreement with care plan.  Final Clinical Impressions(s) / UC Diagnoses   Final diagnoses:  None   Discharge Instructions   None    ED Prescriptions   None    PDMP not reviewed this encounter.   Charma Igo, [place] 05/17/23 1455

## 2023-05-17 NOTE — Telephone Encounter (Signed)
     Chief Complaint: Constipation, but now having loose diarrhea and bloating. " I feel like I'm not emptying out." Has abdominal pressure. Symptoms: Above Frequency: 1 week Pertinent Negatives: Patient denies fever Disposition: [] ED /[x] Urgent Care (no appt availability in office) / [] Appointment(In office/virtual)/ []  [place] [facility]/ [] Home Care/ [] Refused Recommended Disposition /[] [place] [facility]/ []  Follow-up with PCP Additional Notes: No availability, going to UC.  Reason for Disposition  [1] Constant abdominal pain AND [2] present > 2 hours  Answer Assessment - Initial Assessment Questions 1. STOOL PATTERN OR FREQUENCY: "How often do you have a bowel movement (BM)?"  (Normal range: 3 times a day to every 3 days)  "When was your last BM?"       Every other day 2. STRAINING: "Do you have to strain to have a BM?"      No 3. RECTAL PAIN: "Does your rectum hurt when the stool comes out?" If Yes, ask: "Do you have hemorrhoids? How bad is the pain?"  (Scale 1-10; or mild, moderate, severe)     No 4. STOOL COMPOSITION: "Are the stools hard?"      Loose 5. BLOOD ON STOOLS: "Has there been any blood on the toilet tissue or on the surface of the BM?" If Yes, ask: "When was the last time?"     No 6. CHRONIC CONSTIPATION: "Is this a new problem for you?"  If No, ask: "How long have you had this problem?" (days, weeks, months)      No 7. CHANGES IN DIET OR HYDRATION: "Have there been any recent changes in your diet?" "How much fluids are you drinking on a daily basis?"  "How much have you had to drink today?"     No 8. MEDICINES: "Have you been taking any new medicines?" "Are you taking any narcotic pain medicines?" (e.g., Dilaudid, morphine, Percocet, Vicodin)     No 9. LAXATIVES: "Have you been using any stool softeners, laxatives, or enemas?"  If Yes, ask "What, how often, and when was the last time?"     MOM, Senokot  10. ACTIVITY:  "How much walking do you do every  day?"  "Has your activity level decreased in the past week?"        No 11. CAUSE: "What do you think is causing the constipation?"        IBS 12. OTHER SYMPTOMS: "Do you have any other symptoms?" (e.g., abdomen pain, bloating, fever, vomiting)       Bloating 13. MEDICAL HISTORY: "Do you have a history of hemorrhoids, rectal fissures, or rectal surgery or rectal abscess?"         No 14. PREGNANCY: "Is there any chance you are pregnant?" "When was your last menstrual period?"       No  Protocols used: Constipation-A-AH

## 2023-05-17 NOTE — ED Triage Notes (Signed)
Patient comment: "Suffering from severe constipation over last 1-2 weeks. Only BMs are small loose almost diarrhea. Feel very full all the time and bending down or over often causes severe reflux. Also have stomach bloating with pressure."  Has tried dulcolax, milk of magnesia, senokot S, without relief.

## 2023-05-17 NOTE — Discharge Instructions (Signed)
Follow up here or with your primary care provider if your symptoms are worsening or not improving.     

## 2023-05-21 ENCOUNTER — Encounter: Payer: Self-pay | Admitting: Student

## 2023-05-23 ENCOUNTER — Ambulatory Visit
Admission: RE | Admit: 2023-05-23 | Discharge: 2023-05-23 | Disposition: A | Discharge status: Home / Self Care | Payer: Managed Care, Other (non HMO) | Source: Ambulatory Visit | Attending: Student | Admitting: Student

## 2023-05-23 DIAGNOSIS — G43109 Migraine with aura, not intractable, without status migrainosus: Secondary | ICD-10-CM

## 2023-05-23 MED ORDER — GADOPICLENOL 0.5 MMOL/ML IV SOLN
6.0000 mL | Freq: Once | INTRAVENOUS | Status: AC | PRN
  Administered 2023-05-23: 6 mL via INTRAVENOUS

## 2023-05-27 ENCOUNTER — Ambulatory Visit: Payer: Managed Care, Other (non HMO) | Admitting: Family Medicine

## 2023-06-05 ENCOUNTER — Other Ambulatory Visit: Payer: Self-pay

## 2023-06-05 ENCOUNTER — Telehealth: Payer: Self-pay | Admitting: Internal Medicine

## 2023-06-05 ENCOUNTER — Ambulatory Visit: Payer: Managed Care, Other (non HMO) | Admitting: Family Medicine

## 2023-06-05 ENCOUNTER — Other Ambulatory Visit: Payer: Self-pay | Admitting: Internal Medicine

## 2023-06-05 DIAGNOSIS — J302 Other seasonal allergic rhinitis: Secondary | ICD-10-CM

## 2023-06-05 MED ORDER — LEVOCETIRIZINE DIHYDROCHLORIDE 5 MG PO TABS
ORAL_TABLET | ORAL | 1 refills | Status: DC
Start: 2023-06-05 — End: 2023-06-26

## 2023-06-05 MED ORDER — LEVOCETIRIZINE DIHYDROCHLORIDE 5 MG PO TABS
ORAL_TABLET | ORAL | 1 refills | Status: DC
Start: 2023-06-05 — End: 2023-06-05

## 2023-06-05 NOTE — Telephone Encounter (Signed)
Brenda Price with [facility] asking for new prescription Xyzal. Current one states take 11 tablets in the evening. Please advise.

## 2023-06-12 NOTE — Progress Notes (Deleted)
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 Dr. Tretha Sciara OBGYN   LAPAROSCOPY Right 03/03/2016   Procedure:  diagnostic laparoscopy and right oophrectomy;  Surgeon: Se[place] Bing, MD;  Location: [facility] ORS;  Service: Gynecology;  Laterality: Right;    Family History  Problem Relation Age of Onset   Heart attack Father    Arthritis Mother    Hyperlipidemia Mother    Thyroid disease Brother    Breast cancer Neg Hx     Social History   Tobacco Use   Smoking status: Never   Smokeless tobacco: Never  Substance Use Topics   Alcohol use: Yes    Comment: occasional     Current Outpatient Medications:    albuterol (PROVENTIL) (2.5 MG/3ML) 0.083% nebulizer solution, Take 3 mLs (2.5 mg total) by nebulization every 6 (six) hours as needed for wheezing or shortness of breath., Disp: 150 mL, Rfl: 1   albuterol (VENTOLIN HFA) 108 (90 Base) MCG/ACT inhaler,  USE 2 INHALATIONS BY MOUTH EVERY 4 TO 6 HOURS AS NEEDED FOR  WHEEZING, Disp: 34 g, Rfl: 3   baclofen (LIORESAL) 10 MG tablet, Take 1 tablet (10 mg total) by mouth 3 (three) times daily as needed for muscle spasms., Disp: 30 each, Rfl: 5   Galcanezumab-gnlm 120 MG/ML SOAJ, Inject 120 mg into the skin every 30 (thirty) days., Disp: , Rfl:    ketorolac (TORADOL) 10 MG tablet, Take 10 mg by mouth every 6 (six) hours as needed., Disp: , Rfl:    levocetirizine (XYZAL) 5 MG tablet, TAKE 1 TABLET BY MOUTH IN THE EVENING, Disp: 45 tablet, Rfl: 1   levothyroxine (SYNTHROID) 50 MCG tablet, TAKE 1 TABLET BY MOUTH DAILY, Disp: 90 tablet, Rfl: 0   linaclotide (LINZESS) 145 MCG CAPS capsule, Take 1 capsule (145 mcg total) by mouth daily before breakfast., Disp: 30 capsule, Rfl: 5   montelukast (SINGULAIR) 10 MG tablet, Take 1 tablet (10 mg total) by mouth at bedtime., Disp: 90 tablet, Rfl: 3   nortriptyline (PAMELOR) 10 MG capsule, Take 10 mg by mouth at bedtime., Disp: , Rfl:    omeprazole (PRILOSEC) 40 MG capsule, TAKE 1 CAPSULE(40 MG) BY MOUTH DAILY BEFORE BREAKFAST, Disp: 30 capsule, Rfl: 1   ondansetron (ZOFRAN) 4 MG tablet, , Disp: , Rfl:    OXcarbazepine (TRILEPTAL) 150 MG tablet, Take 150 mg by mouth 2 (two) times daily., Disp: , Rfl:  pregabalin (LYRICA) 50 MG capsule, , Disp: , Rfl:    promethazine (PHENERGAN) 12.5 MG tablet, Take 12.5 mg by mouth., Disp: , Rfl:    triamcinolone ointment (KENALOG) 0.1 %, Apply 1 application topically 2 (two) times daily., Disp: 30 g, Rfl: 2  Allergies  Allergen Reactions   Aspirin Other (See Comments)    Causes asthma to act up    Erythromycin Other (See Comments)    Stomach cramping     I personally reviewed {Reviewed:14835} with the patient/caregiver today.   ROS  ***  Objective  There were no vitals filed for this visit.  There is no height or weight on file to calculate BMI.  Physical Exam ***  No results found for this or any previous visit  (from the past 2160 hour(s)).  Diabetic Foot Exam: Diabetic Foot Exam - Simple   No data filed    ***  PHQ2/9:    11/09/2022    9:12 AM 05/25/2022    8:10 AM 11/08/2021    8:50 AM 06/01/2021    3:51 PM 10/28/2020   12:03 PM  Depression screen PHQ 2/9  Decreased Interest 0 0 0 0 0  Down, Depressed, Hopeless 0 0 0 0 0  PHQ - 2 Score 0 0 0 0 0  Altered sleeping 0 0 0    Tired, decreased energy 0 0 0    Change in appetite 0 0 0    Feeling bad or failure about yourself  0 0 0    Trouble concentrating 0 0 0    Moving slowly or fidgety/restless 0 0 0    Suicidal thoughts 0 0 0    PHQ-9 Score 0 0 0    Difficult doing work/chores Not difficult at all Not difficult at all Not difficult at all      phq 9 is {gen pos UJW:119147} ***  Fall Risk:    11/09/2022    9:02 AM 05/25/2022    8:09 AM 11/08/2021    8:47 AM 06/01/2021    3:51 PM 10/28/2020   12:03 PM  Fall Risk   Falls in the past year? 0 0 0 0 1  Number falls in past yr: 0 0 0 0 0  Injury with Fall? 0 0 0 0 1  Risk for fall due to :  No Fall Risks     Follow up  Falls prevention discussed;Education provided  Falls evaluation completed Falls evaluation completed   ***   Functional Status Survey:   ***   Assessment & Plan  *** There are no diagnoses linked to this encounter.

## 2023-06-14 ENCOUNTER — Ambulatory Visit: Payer: Managed Care, Other (non HMO) | Admitting: Family Medicine

## 2023-06-14 ENCOUNTER — Telehealth: Payer: Managed Care, Other (non HMO) | Admitting: Family Medicine

## 2023-06-20 ENCOUNTER — Ambulatory Visit: Payer: Managed Care, Other (non HMO) | Admitting: Internal Medicine

## 2023-06-24 ENCOUNTER — Other Ambulatory Visit: Payer: Self-pay

## 2023-06-24 NOTE — Progress Notes (Unsigned)
   Established Patient Office Visit  Subjective   Patient ID: Brenda Price, female    DOB: 1970-11-26  Age: 52 y.o. MRN: [id number]  No chief complaint on file.   HPI  Patient presents to follow up on chronic medical conditions.  Hypothyroidism: -Medications: Levothyroxine 50 mcg -Patient is compliant with the above medication (s) at the above dose and reports no medication side effects.  -Denies weight changes, cold/heat intolerance, skin changes, anxiety/palpitations  -Last TSH: 2/24 3.280   Seasonal Allergies/Asthma: -Currently on Albuterol PRN, Xyzal 5 mg at night -Allergies usually worse in spring but bothering her now, perhaps due to warmer weather  {History (Optional):23778}  ROS    Objective:     There were no vitals taken for this visit. {Vitals History (Optional):23777}  Physical Exam   No results found for any visits on 06/26/23.  {Labs (Optional):23779}  The 10-year ASCVD risk score (Arnett DK, et al., 2019) is: 1.1%    Assessment & Plan:   Problem List Items Addressed This Visit   None   No follow-ups on file.    Margarita Mail, DO

## 2023-06-26 ENCOUNTER — Encounter: Payer: Self-pay | Admitting: Internal Medicine

## 2023-06-26 ENCOUNTER — Ambulatory Visit: Payer: Managed Care, Other (non HMO) | Admitting: Internal Medicine

## 2023-06-26 VITALS — BP 130/82 | HR 87 | Temp 97.5°F | Resp 18 | Ht 64.0 in | Wt 140.1 lb

## 2023-06-26 DIAGNOSIS — J302 Other seasonal allergic rhinitis: Secondary | ICD-10-CM

## 2023-06-26 DIAGNOSIS — E039 Hypothyroidism, unspecified: Secondary | ICD-10-CM

## 2023-06-26 DIAGNOSIS — E782 Mixed hyperlipidemia: Secondary | ICD-10-CM | POA: Diagnosis not present

## 2023-06-26 MED ORDER — LEVOCETIRIZINE DIHYDROCHLORIDE 5 MG PO TABS
ORAL_TABLET | ORAL | 1 refills | Status: DC
Start: 2023-06-26 — End: 2024-02-24

## 2023-06-26 MED ORDER — LEVOTHYROXINE SODIUM 50 MCG PO TABS
50.0000 ug | ORAL_TABLET | Freq: Every day | ORAL | 1 refills | Status: DC
Start: 2023-06-26 — End: 2024-04-30

## 2023-07-01 ENCOUNTER — Encounter: Payer: Self-pay | Admitting: Gastroenterology

## 2023-07-01 ENCOUNTER — Ambulatory Visit (INDEPENDENT_AMBULATORY_CARE_PROVIDER_SITE_OTHER): Payer: Managed Care, Other (non HMO) | Admitting: Gastroenterology

## 2023-07-01 VITALS — BP 126/86 | HR 94 | Temp 98.6°F | Ht 64.0 in | Wt 135.5 lb

## 2023-07-01 DIAGNOSIS — K219 Gastro-esophageal reflux disease without esophagitis: Secondary | ICD-10-CM

## 2023-07-01 DIAGNOSIS — K5909 Other constipation: Secondary | ICD-10-CM | POA: Diagnosis not present

## 2023-07-01 DIAGNOSIS — K5904 Chronic idiopathic constipation: Secondary | ICD-10-CM

## 2023-07-01 MED ORDER — LINACLOTIDE 145 MCG PO CAPS
145.0000 ug | ORAL_CAPSULE | Freq: Every day | ORAL | 3 refills | Status: AC
Start: 2023-07-01 — End: ?

## 2023-07-01 NOTE — Progress Notes (Signed)
[name], MD [address]  [address]  [place], [place] [address]  Main: [phone]  Fax: [phone]    Gastroenterology Consultation  Referring Provider:     Danelle Berry, PA-C Primary Care Physician:  Margarita Mail, DO Primary Gastroenterologist:  Dr. [name] Reason for Consultation: Chronic idiopathic constipation, chronic GERD        HPI:   Brenda Price is a 52 y.o. female referred by Dr. Margarita Mail, DO  for consultation & management of chronic constipation and chronic GERD Chronic constipation: Patient reports that she has been experiencing irregular bowel habits for several years, has been taking Senokot daily.  If she does not take Senokot, she does not have a BM for 1 to 2 weeks and sometimes up to 3 weeks.  Patient reports that she cannot have fruits with skin, which results in severe abdominal cramps.  She also does not consume vegetables daily.  She does drink 64 ounces of water daily.  Her weight has been stable.  She tries to walk 15 to 20 minutes daily and has been physically active.  Patient did not try any prescription medications for constipation so far.  She has not tried MiraLAX or Metamucil  Chronic GERD Patient reports heartburn almost on a daily basis, particularly worse when she has severe episodes of constipation.  She does report nocturnal heartburn.  She was taking Zantac until it was recalled.  Currently, she is not on any acid suppressive medication.  She denies any difficulty swallowing.  Follow-up visit 07/01/2023 [name] here for follow-up of constipation and symptoms of GERD.  Linzess 145 mcg worked very well for her.  She took it only for 3 months and then could not get refills because she did not have a follow-up appointment.  She is currently managing with MiraLAX with modest benefit only.  She is taking over-the-counter omeprazole 20 mg daily, continues to have intermittent symptoms of heartburn, regurgitation.   Her upper endoscopy was denied by her insurance company last time, required to give the optimal PPI trial before reconsidering upper endoscopy.  Patient is interested to undergo upper endoscopy because her symptoms are not in remission.  She does not smoke or drink alcohol She likes to eat chocolate regularly  NSAIDs: None  Antiplts/Anticoagulants/Anti thrombotics: None  GI Procedures:  Screening colonoscopy 12/29/2021 - One 5 mm polyp in the transverse colon, removed with a cold snare. Resected and retrieved. - The distal rectum and anal verge are normal on retroflexion view. - Melanosis in the colon. DIAGNOSIS:  A. COLON POLYP, TRANSVERSE; COLD SNARE:  - SESSILE SERRATED POLYP.  - MELANOSIS COLI.  - NEGATIVE FOR DYSPLASIA AND MALIGNANCY.    Past Medical History:  Diagnosis Date   Allergy    Asthma    Cardiac murmur 09/29/2015   Embedded toenail 09/29/2015   Family planning 09/29/2015   Fibromyalgia    GERD (gastroesophageal reflux disease)    Heart murmur    Hemorrhoid 09/29/2015   IBS (irritable bowel syndrome)    Thyroid disease     Past Surgical History:  Procedure Laterality Date   ABDOMINAL HYSTERECTOMY     CESAREAN SECTION     COLONOSCOPY WITH PROPOFOL N/A 12/29/2021   Procedure: COLONOSCOPY WITH PROPOFOL;  Surgeon: Toney Reil, MD;  Location: [facility] END[facility]COPY;  Service: Gastroenterology;  Laterality: N/A;   KNEE ARTHROSCOPY     LAPAROSCOPIC TOTAL HYSTERECTOMY     with BS. Dr. Tretha Sciara OBGYN  LAPAROSCOPY Right 03/03/2016   Procedure:  diagnostic laparoscopy and right oophrectomy;  Surgeon: [place] Bing, MD;  Location: [facility] ORS;  Service: Gynecology;  Laterality: Right;     Current Outpatient Medications:    albuterol (PROVENTIL) (2.5 MG/3ML) 0.083% nebulizer solution, Take 3 mLs (2.5 mg total) by nebulization every 6 (six) hours as needed for wheezing or shortness of breath., Disp: 150 mL, Rfl: 1   albuterol (VENTOLIN HFA) 108 (90 Base)  MCG/ACT inhaler, USE 2 INHALATIONS BY MOUTH EVERY 4 TO 6 HOURS AS NEEDED FOR  WHEEZING, Disp: 34 g, Rfl: 3   atorvastatin (LIPITOR) 10 MG tablet, Take 1 tablet (10 mg total) by mouth daily., Disp: , Rfl:    baclofen (LIORESAL) 10 MG tablet, Take 1 tablet (10 mg total) by mouth 3 (three) times daily as needed for muscle spasms., Disp: 30 each, Rfl: 5   Galcanezumab-gnlm 120 MG/ML SOAJ, Inject 120 mg into the skin every 30 (thirty) days., Disp: , Rfl:    ketorolac (TORADOL) 10 MG tablet, Take 10 mg by mouth every 6 (six) hours as needed., Disp: , Rfl:    levocetirizine (XYZAL) 5 MG tablet, TAKE 1 TABLET BY MOUTH IN THE EVENING, Disp: 90 tablet, Rfl: 1   levothyroxine (SYNTHROID) 50 MCG tablet, Take 1 tablet (50 mcg total) by mouth daily., Disp: 90 tablet, Rfl: 1   linaclotide (LINZESS) 145 MCG CAPS capsule, Take 1 capsule (145 mcg total) by mouth daily before breakfast., Disp: 30 capsule, Rfl: 5   montelukast (SINGULAIR) 10 MG tablet, Take 1 tablet (10 mg total) by mouth at bedtime., Disp: 90 tablet, Rfl: 3   nortriptyline (PAMELOR) 25 MG capsule, Take 25 mg by mouth at bedtime., Disp: , Rfl:    omeprazole (PRILOSEC) 40 MG capsule, TAKE 1 CAPSULE(40 MG) BY MOUTH DAILY BEFORE BREAKFAST, Disp: 30 capsule, Rfl: 1   ondansetron (ZOFRAN) 4 MG tablet, , Disp: , Rfl:    OXcarbazepine (TRILEPTAL) 150 MG tablet, Take 150 mg by mouth 2 (two) times daily., Disp: , Rfl:    pregabalin (LYRICA) 25 MG capsule, Take 25 mg by mouth daily., Disp: , Rfl:    promethazine (PHENERGAN) 12.5 MG tablet, Take 12.5 mg by mouth., Disp: , Rfl:    QULIPTA 30 MG TABS, Take 1 tablet by mouth daily., Disp: , Rfl:    triamcinolone ointment (KENALOG) 0.1 %, Apply 1 application topically 2 (two) times daily., Disp: 30 g, Rfl: 2   UBRELVY 50 MG TABS, Take 50 mg by mouth as needed (Take 50 mg by mouth as directed 1 tablet as needed for headache rescue, can repeat dose in 1 - 2 hours). Take 50 mg by mouth as directed 1 tablet as needed for  headache rescue, can repeat dose in 1 - 2 hours, Disp: , Rfl:    Family History  Problem Relation Age of Onset   Heart attack Father    Arthritis Mother    Hyperlipidemia Mother    Thyroid disease Brother    Breast cancer Neg Hx      Social History   Tobacco Use   Smoking status: Never   Smokeless tobacco: Never  Vaping Use   Vaping status: Never Used  Substance Use Topics   Alcohol use: Yes    Comment: occasional   Drug use: No    Allergies as of 07/01/2023 - Review Complete 07/01/2023  Allergen Reaction Noted   Aspirin Other (See Comments) 07/09/2014   Erythromycin Other (See Comments) 07/09/2014    Review  of Systems:    All systems reviewed and negative except where noted in HPI.   Physical Exam:  BP 126/86   Pulse 94   Temp 98.6 F (37 C) (Oral)   Ht 5\' 4"  (1.626 m)   Wt 135 lb 8 oz (61.5 kg)   BMI 23.26 kg/m  No LMP recorded. Patient has had a hysterectomy.  General:   Alert,  Well-developed, well-nourished, pleasant and cooperative in NAD Head:  Normocephalic and atraumatic. Eyes:  Sclera clear, no icterus.   Conjunctiva pink. Ears:  Normal auditory acuity. Nose:  No deformity, discharge, or lesions. Mouth:  No deformity or lesions,oropharynx pink & moist. Neck:  Supple; no masses or thyromegaly. Lungs:  Respirations even and unlabored.  Clear throughout to auscultation.   No wheezes, crackles, or rhonchi. No acute distress. Heart:  Regular rate and rhythm; no murmurs, clicks, rubs, or gallops. Abdomen:  Normal bowel sounds. Soft, non-tender and non-distended without masses, hepatosplenomegaly or hernias noted.  No guarding or rebound tenderness.   Rectal: Not performed Msk:  Symmetrical without gross deformities. Good, equal movement & strength bilaterally. Pulses:  Normal pulses noted. Extremities:  No clubbing or edema.  No cyanosis. Neurologic:  Alert and oriented x3;  grossly normal neurologically. Skin:  Intact without significant lesions or  rashes. No jaundice. Psych:  Alert and cooperative. Normal mood and affect.  Imaging Studies: No abdominal imaging  Assessment and Plan:   ALEXSYS TETTER is a 52 y.o. female with history of hypothyroidism please seen in consultation for chronic severe constipation and chronic GERD  Chronic severe constipation TSH and free T4 normal Normal hemoglobin Restart Linzess 145 mcg daily Continue high-fiber diet, adequate intake of water, physical activity  Chronic GERD: Persistent despite being on PPI Recommend EGD for further evaluation including esophageal biopsies On omeprazole 20 mg daily  Continue antireflux lifestyle  Follow up annually or as needed   [name], MD

## 2023-08-20 ENCOUNTER — Telehealth: Payer: Self-pay

## 2023-08-20 MED ORDER — PANTOPRAZOLE SODIUM 40 MG PO TBEC: 40.0000 mg | DELAYED_RELEASE_TABLET | Freq: Two times a day (BID) | ORAL | 1 refills | Status: DC

## 2023-08-20 NOTE — Telephone Encounter (Signed)
Called and set up a peer for EGD. The Case Number: [id number]. Set up peer to peer for today at 3:15pm they will call your cell phone.

## 2023-08-20 NOTE — Addendum Note (Signed)
Addended by: Radene Knee L on: 08/20/2023 01:03 PM   Modules accepted: Orders

## 2023-08-20 NOTE — Telephone Encounter (Signed)
Brenda Price, her insurance will not approve her EGD because they want her to try optimal dose PPI. Please have patient try Protonix 40 mg twice daily before meals for 8 weeks. If her heartburn is still persistent, lets schedule EGD after 8 weeks. Please inform patient about the same, thanks   Please cancel peer to peer  RV

## 2023-08-20 NOTE — Telephone Encounter (Signed)
Per [employer] [phone] EDG is not covered per fax. Peer to peer can be done by calling [phone].  I fax all the notes that'S  in chart. Case [id number]

## 2023-08-20 NOTE — Telephone Encounter (Signed)
Called patient and patient verbalized understanding of instructions. She states she will try this and let us know in 8 weeks if she is still having the heartburn. Sent medication to the pharmacy. Canceled the peer to peer. Called endo and talk to trish and she will get EGD canceled

## 2023-08-22 ENCOUNTER — Ambulatory Visit
Admission: RE | Admit: 2023-08-22 | Payer: Managed Care, Other (non HMO) | Source: Home / Self Care | Admitting: Gastroenterology

## 2023-08-22 ENCOUNTER — Encounter: Admission: RE | Payer: Self-pay | Source: Home / Self Care

## 2023-08-22 ENCOUNTER — Encounter: Payer: Self-pay | Admitting: Gastroenterology

## 2023-08-22 SURGERY — ESOPHAGOGASTRODUODENOSCOPY (EGD) WITH PROPOFOL
Anesthesia: General

## 2023-08-22 MED ORDER — PANTOPRAZOLE SODIUM 40 MG PO TBEC: 40.0000 mg | DELAYED_RELEASE_TABLET | Freq: Two times a day (BID) | ORAL | 1 refills | Status: DC

## 2023-08-27 ENCOUNTER — Telehealth: Payer: Self-pay

## 2023-08-27 NOTE — Telephone Encounter (Signed)
Submitted a PA through cover my meds for the Pantoprazole 40mg  twice a day. Waiting on response from insurance company

## 2023-08-27 NOTE — Telephone Encounter (Signed)
Request Reference Number: [id number]. PANTOPRAZOLE TAB 40MG  is approved through 08/26/2024. Your patient may now fill this prescription and it will be covered. Authorization Expiration Date: 08/26/2024

## 2023-08-30 ENCOUNTER — Encounter: Payer: Self-pay | Admitting: Gastroenterology

## 2023-10-08 ENCOUNTER — Other Ambulatory Visit: Payer: Self-pay

## 2023-10-08 ENCOUNTER — Encounter: Payer: Self-pay | Admitting: Gastroenterology

## 2023-10-08 DIAGNOSIS — R1319 Other dysphagia: Secondary | ICD-10-CM

## 2023-10-15 ENCOUNTER — Other Ambulatory Visit: Payer: Self-pay | Admitting: Internal Medicine

## 2023-10-15 DIAGNOSIS — J302 Other seasonal allergic rhinitis: Secondary | ICD-10-CM

## 2023-10-15 DIAGNOSIS — J45909 Unspecified asthma, uncomplicated: Secondary | ICD-10-CM

## 2023-10-17 NOTE — Telephone Encounter (Signed)
Requested Prescriptions  Pending Prescriptions Disp Refills   montelukast (SINGULAIR) 10 MG tablet [Pharmacy Med Name: MONTELUKAST 10MG  TABLETS] 90 tablet 1    Sig: TAKE 1 TABLET(10 MG) BY MOUTH AT BEDTIME     Pulmonology:  Leukotriene Inhibitors Passed - 10/15/2023  6:19 AM      Passed - Valid encounter within last 12 months    Recent Outpatient Visits           3 months ago Seasonal allergic rhinitis, unspecified trigger   [facility] Margarita Mail, DO   11 months ago Annual physical exam   [facility] Margarita Mail, DO   1 year ago Cellulitis of left lower extremity   [facility] Danelle Berry, PA-C   1 year ago Screening for colon cancer   [facility] Margarita Mail, DO   2 years ago Seasonal allergic rhinitis, unspecified trigger   [facility] Danelle Berry, PA-C       Future Appointments             In 1 month Willeen Niece, MD [facility]   In 2 months Margarita Mail, DO [facility]

## 2023-10-18 ENCOUNTER — Ambulatory Visit: Payer: Managed Care, Other (non HMO) | Admitting: Anesthesiology

## 2023-10-18 ENCOUNTER — Encounter
Admission: RE | Disposition: A | Discharge status: Home / Self Care | Payer: Self-pay | Source: Home / Self Care | Attending: Gastroenterology

## 2023-10-18 ENCOUNTER — Encounter: Payer: Self-pay | Admitting: Gastroenterology

## 2023-10-18 ENCOUNTER — Ambulatory Visit
Admission: RE | Admit: 2023-10-18 | Discharge: 2023-10-18 | Disposition: A | Discharge status: Home / Self Care | Payer: Managed Care, Other (non HMO) | Attending: Gastroenterology | Admitting: Gastroenterology

## 2023-10-18 DIAGNOSIS — K2100 Gastro-esophageal reflux disease with esophagitis, without bleeding: Secondary | ICD-10-CM | POA: Insufficient documentation

## 2023-10-18 DIAGNOSIS — K2970 Gastritis, unspecified, without bleeding: Secondary | ICD-10-CM | POA: Diagnosis not present

## 2023-10-18 DIAGNOSIS — K319 Disease of stomach and duodenum, unspecified: Secondary | ICD-10-CM

## 2023-10-18 DIAGNOSIS — R519 Headache, unspecified: Secondary | ICD-10-CM | POA: Insufficient documentation

## 2023-10-18 DIAGNOSIS — K219 Gastro-esophageal reflux disease without esophagitis: Secondary | ICD-10-CM

## 2023-10-18 DIAGNOSIS — J45909 Unspecified asthma, uncomplicated: Secondary | ICD-10-CM | POA: Insufficient documentation

## 2023-10-18 DIAGNOSIS — R1319 Other dysphagia: Secondary | ICD-10-CM

## 2023-10-18 DIAGNOSIS — E039 Hypothyroidism, unspecified: Secondary | ICD-10-CM | POA: Insufficient documentation

## 2023-10-18 DIAGNOSIS — K2960 Other gastritis without bleeding: Secondary | ICD-10-CM | POA: Insufficient documentation

## 2023-10-18 HISTORY — DX: Headache, unspecified: R51.9

## 2023-10-18 HISTORY — PX: ESOPHAGOGASTRODUODENOSCOPY (EGD) WITH PROPOFOL: SHX5813

## 2023-10-18 HISTORY — DX: Other disorders of pituitary gland: E23.6

## 2023-10-18 HISTORY — PX: BIOPSY: SHX5522

## 2023-10-18 SURGERY — ESOPHAGOGASTRODUODENOSCOPY (EGD) WITH PROPOFOL
Anesthesia: General

## 2023-10-18 MED ORDER — SODIUM CHLORIDE 0.9 % IV SOLN: INTRAVENOUS | Status: DC

## 2023-10-18 MED ORDER — PROPOFOL 500 MG/50ML IV EMUL
INTRAVENOUS | Status: DC | PRN
  Administered 2023-10-18: 145 ug/kg/min via INTRAVENOUS

## 2023-10-18 MED ORDER — LIDOCAINE HCL (CARDIAC) PF 100 MG/5ML IV SOSY
PREFILLED_SYRINGE | INTRAVENOUS | Status: DC | PRN
  Administered 2023-10-18: 60 mg via INTRAVENOUS

## 2023-10-18 MED ORDER — GLYCOPYRROLATE 0.2 MG/ML IJ SOLN
INTRAMUSCULAR | Status: DC | PRN
  Administered 2023-10-18: .2 mg via INTRAVENOUS

## 2023-10-18 MED ORDER — MIDAZOLAM HCL 2 MG/2ML IJ SOLN
INTRAMUSCULAR | Status: AC
  Filled 2023-10-18: qty 2

## 2023-10-18 MED ORDER — PROPOFOL 10 MG/ML IV BOLUS
INTRAVENOUS | Status: DC | PRN
  Administered 2023-10-18: 20 mg via INTRAVENOUS
  Administered 2023-10-18: 60 mg via INTRAVENOUS
  Administered 2023-10-18: 20 mg via INTRAVENOUS

## 2023-10-18 MED ORDER — MIDAZOLAM HCL 2 MG/2ML IJ SOLN
INTRAMUSCULAR | Status: DC | PRN
  Administered 2023-10-18: 2 mg via INTRAVENOUS

## 2023-10-18 NOTE — Transfer of Care (Signed)
Immediate Anesthesia Transfer of Care Note  Patient: Brenda Price  Procedure(s) Performed: ESOPHAGOGASTRODUODENOSCOPY (EGD) WITH PROPOFOL  Patient Location: Endoscopy Unit  Anesthesia Type:General  Level of Consciousness: drowsy and patient cooperative  Airway & Oxygen Therapy: Patient Spontanous Breathing and Patient connected to face mask oxygen  Post-op Assessment: Report given to RN and Post -op Vital signs reviewed and stable  Post vital signs: Reviewed and stable  Last Vitals:  Vitals Value Taken Time  BP 126/87 10/18/23 1020  Temp 36 C 10/18/23 1020  Pulse 76 10/18/23 1022  Resp 14 10/18/23 1022  SpO2 96 % 10/18/23 1022  Vitals shown include unfiled device data.  Last Pain:  Vitals:   10/18/23 1020  TempSrc: Tympanic  PainSc: Asleep         Complications: No notable events documented.

## 2023-10-18 NOTE — H&P (Signed)
[name], MD [address]  [address]  [place], [place] [address]  Main: [phone]  Fax: [phone] Pager: [phone]  Primary Care Physician:  Margarita Mail, DO Primary Gastroenterologist:  Dr. [name]  Pre-Procedure History & Physical: HPI:  Brenda Price is a 52 y.o. female is here for an endoscopy.   Past Medical History:  Diagnosis Date   Allergy    Asthma    Cardiac murmur 09/29/2015   Embedded toenail 09/29/2015   Family planning 09/29/2015   Fibromyalgia    GERD (gastroesophageal reflux disease)    Headache    Heart murmur    Hemorrhoid 09/29/2015   IBS (irritable bowel syndrome)    Pituitary cyst (HCC)    Thyroid disease     Past Surgical History:  Procedure Laterality Date   ABDOMINAL HYSTERECTOMY     CESAREAN SECTION     COLONOSCOPY WITH PROPOFOL N/A 12/29/2021   Procedure: COLONOSCOPY WITH PROPOFOL;  Surgeon: Toney Reil, MD;  Location: [facility] ENDOSCOPY;  Service: Gastroenterology;  Laterality: N/A;   KNEE ARTHROSCOPY     LAPAROSCOPIC TOTAL HYSTERECTOMY     with BS. Dr. Tretha Sciara OBGYN   LAPAROSCOPY Right 03/03/2016   Procedure:  diagnostic laparoscopy and right oophrectomy;  Surgeon: [place] Bing, MD;  Location: [facility] ORS;  Service: Gynecology;  Laterality: Right;    Prior to Admission medications   Medication Sig Start Date End Date Taking? Authorizing Provider  albuterol (PROVENTIL) (2.5 MG/3ML) 0.083% nebulizer solution Take 3 mLs (2.5 mg total) by nebulization every 6 (six) hours as needed for wheezing or shortness of breath. 10/22/19   Doren Custard, FNP  albuterol (VENTOLIN HFA) 108 (90 Base) MCG/ACT inhaler USE 2 INHALATIONS BY MOUTH EVERY 4 TO 6 HOURS AS NEEDED FOR  WHEEZING 12/12/22   Margarita Mail, DO  atorvastatin (LIPITOR) 10 MG tablet Take 1 tablet (10 mg total) by mouth daily. 06/26/23   Margarita Mail, DO  baclofen (LIORESAL) 10 MG tablet Take 1 tablet (10 mg total) by mouth 3 (three)  times daily as needed for muscle spasms. 10/11/17   Trey Sailors, PA-C  Galcanezumab-gnlm 120 MG/ML SOAJ Inject 120 mg into the skin every 30 (thirty) days.    [provider]  ketorolac (TORADOL) 10 MG tablet Take 10 mg by mouth every 6 (six) hours as needed. 10/16/19   [provider]  levocetirizine (XYZAL) 5 MG tablet TAKE 1 TABLET BY MOUTH IN THE EVENING 06/26/23   Margarita Mail, DO  levothyroxine (SYNTHROID) 50 MCG tablet Take 1 tablet (50 mcg total) by mouth daily. 06/26/23   Margarita Mail, DO  linaclotide [facility]) 145 MCG CAPS capsule Take 1 capsule (145 mcg total) by mouth daily before breakfast. 07/01/23   Toney Reil, MD  montelukast (SINGULAIR) 10 MG tablet TAKE 1 TABLET(10 MG) BY MOUTH AT BEDTIME 10/17/23   Margarita Mail, DO  nortriptyline (PAMELOR) 25 MG capsule Take 25 mg by mouth at bedtime.    [provider]  ondansetron (ZOFRAN) 4 MG tablet  10/16/19   [provider]  OXcarbazepine (TRILEPTAL) 150 MG tablet Take 150 mg by mouth 2 (two) times daily. 03/31/21   [provider]  pantoprazole (PROTONIX) 40 MG tablet Take 1 tablet (40 mg total) by mouth 2 (two) times daily before a meal. 08/22/23   Kavari Parrillo, Loel Dubonnet, MD  pregabalin (LYRICA) 25 MG capsule Take 25 mg by mouth daily. 02/06/23   [provider]  promethazine (PHENERGAN) 12.5  MG tablet Take 12.5 mg by mouth. 10/16/19   [provider]  QULIPTA 30 MG TABS Take 1 tablet by mouth daily. 06/07/23   [provider]  triamcinolone ointment (KENALOG) 0.1 % Apply 1 application topically 2 (two) times daily. 10/28/20   Danelle Berry, PA-C  UBRELVY 50 MG TABS Take 50 mg by mouth as needed (Take 50 mg by mouth as directed 1 tablet as needed for headache rescue, can repeat dose in 1 - 2 hours). Take 50 mg by mouth as directed 1 tablet as needed for headache rescue, can repeat dose in 1 - 2 hours 06/07/23   [provider]    Allergies  as of 10/08/2023 - Review Complete 07/01/2023  Allergen Reaction Noted   Aspirin Other (See Comments) 07/09/2014   Erythromycin Other (See Comments) 07/09/2014    Family History  Problem Relation Age of Onset   Heart attack Father    Arthritis Mother    Hyperlipidemia Mother    Thyroid disease Brother    Breast cancer Neg Hx     Social History   Socioeconomic History   Marital status: Married    Spouse name: Thayer Ohm   Number of children: 2   Years of education: Not on file   Highest education level: Not on file  Occupational History   Not on file  Tobacco Use   Smoking status: Never   Smokeless tobacco: Never  Vaping Use   Vaping status: Never Used  Substance and Sexual Activity   Alcohol use: Not Currently    Comment: occasional   Drug use: No   Sexual activity: Yes    Partners: Female    Birth control/protection: None    Comment: Hysterectomy  Other Topics Concern   Not on file  Social History Narrative   Not on file   Social Determinants of Health   Financial Resource Strain: Low Risk  (11/09/2022)   Overall Financial Resource Strain (CARDIA)    Difficulty of Paying Living Expenses: Not hard at all  Food Insecurity: No Food Insecurity (11/09/2022)   Hunger Vital Sign    Worried About Running Out of Food in the Last Year: Never true    Ran Out of Food in the Last Year: Never true  Transportation Needs: No Transportation Needs (11/08/2021)   PRAPARE - Administrator, Civil Service (Medical): No    Lack of Transportation (Non-Medical): No  Physical Activity: Insufficiently Active (11/09/2022)   Exercise Vital Sign    Days of Exercise per Week: 2 days    Minutes of Exercise per Session: 20 min  Stress: Stress Concern Present (11/09/2022)   [employer] of Occupational Health - Occupational Stress Questionnaire    Feeling of Stress : To some extent  Social Connections: Moderately Integrated (11/09/2022)   Social Connection and Isolation  Panel [NHANES]    Frequency of Communication with Friends and Family: Twice a week    Frequency of Social Gatherings with Friends and Family: Twice a week    Attends Religious Services: 1 to 4 times per year    Active Member of [employer] or Organizations: No    Attends Banker Meetings: Never    Marital Status: Married  Catering manager Violence: Not At Risk (11/09/2022)   Humiliation, Afraid, Rape, and Kick questionnaire    Fear of Current or Ex-Partner: No    Emotionally Abused: No    Physically Abused: No    Sexually Abused: No    Review  of Systems: See HPI, otherwise negative ROS  Physical Exam: BP (!) 146/93   Pulse 73   Temp (!) 96.9 F (36.1 C) (Temporal)   Resp 16   Wt 64 kg   SpO2 100%   BMI 24.20 kg/m  General:   Alert,  pleasant and cooperative in NAD Head:  Normocephalic and atraumatic. Neck:  Supple; no masses or thyromegaly. Lungs:  Clear throughout to auscultation.    Heart:  Regular rate and rhythm. Abdomen:  Soft, nontender and nondistended. Normal bowel sounds, without guarding, and without rebound.   Neurologic:  Alert and  oriented x4;  grossly normal neurologically.  Impression/Plan: BRIYANNA GRINNAN is here for an endoscopy to be performed for chronic GERD  Risks, benefits, limitations, and alternatives regarding  endoscopy have been reviewed with the patient.  Questions have been answered.  All parties agreeable.   Lannette Donath, MD  10/18/2023, 10:03 AM

## 2023-10-18 NOTE — Op Note (Signed)
[facility] Gastroenterology Patient Name: Brenda Price Procedure Date: 10/18/2023 9:58 AM MRN: [id number] Account #: [id number] Date of Birth: 1971/05/18 Admit Type: Outpatient Age: 52 Room: [facility] ENDO ROOM 2 Gender: Female Note Status: Finalized Instrument Name: Patton Salles Endoscope [id number] Procedure:             Upper GI endoscopy Indications:           Esophageal reflux symptoms that persist despite                         appropriate therapy Providers:             Toney Reil MD, MD Referring MD:          Margarita Mail (Referring MD) Medicines:             General Anesthesia Complications:         No immediate complications. Estimated blood loss: None. Procedure:             Pre-Anesthesia Assessment:                        - Prior to the procedure, a History and Physical was                         performed, and patient medications and allergies were                         reviewed. The patient is competent. The risks and                         benefits of the procedure and the sedation options and                         risks were discussed with the patient. All questions                         were answered and informed consent was obtained.                         Patient identification and proposed procedure were                         verified by the physician, the nurse, the                         anesthesiologist, the anesthetist and the technician                         in the pre-procedure area in the procedure room in the                         endoscopy suite. Mental Status Examination: alert and                         oriented. Airway Examination: normal oropharyngeal                         airway and neck mobility. Respiratory Examination:  clear to auscultation. CV Examination: normal.                         Prophylactic Antibiotics: The patient does not require                         prophylactic  antibiotics. Prior Anticoagulants: The                         patient has taken no anticoagulant or antiplatelet                         agents. ASA Grade Assessment: II - A patient with mild                         systemic disease. After reviewing the risks and                         benefits, the patient was deemed in satisfactory                         condition to undergo the procedure. The anesthesia                         plan was to use general anesthesia. Immediately prior                         to administration of medications, the patient was                         re-assessed for adequacy to receive sedatives. The                         heart rate, respiratory rate, oxygen saturations,                         blood pressure, adequacy of pulmonary ventilation, and                         response to care were monitored throughout the                         procedure. The physical status of the patient was                         re-assessed after the procedure.                        After obtaining informed consent, the endoscope was                         passed under direct vision. Throughout the procedure,                         the patient's blood pressure, pulse, and oxygen                         saturations were monitored continuously. The Endoscope  was introduced through the mouth, and advanced to the                         second part of duodenum. The upper GI endoscopy was                         accomplished without difficulty. The patient tolerated                         the procedure well. Findings:      The gastroesophageal junction and examined esophagus were normal.       Biopsies were taken with a cold forceps for histology.      Esophagogastric landmarks were identified: the gastroesophageal junction       was found at 35 cm from the incisors.      Diffuse moderately erythematous mucosa without bleeding was found on the        greater curvature of the gastric body. Biopsies were taken with a cold       forceps for histology.      The incisura and gastric antrum were normal. Biopsies were taken with a       cold forceps for histology. Estimated blood loss: none.      The cardia and gastric fundus were normal on retroflexion.      The duodenal bulb and second portion of the duodenum were normal. Impression:            - Normal gastroesophageal junction and esophagus.                         Biopsied.                        - Esophagogastric landmarks identified.                        - Erythematous mucosa in the greater curvature of the                         gastric body. Biopsied.                        - Normal incisura and antrum. Biopsied.                        - Normal duodenal bulb and second portion of the                         duodenum. Recommendation:        - Discharge patient to home (with escort).                        - Resume previous diet today.                        - Continue present medications.                        - Await pathology results. Procedure Code(s):     --- Professional ---  40981, Esophagogastroduodenoscopy, flexible,                         transoral; with biopsy, single or multiple Diagnosis Code(s):     --- Professional ---                        K31.89, Other diseases of stomach and duodenum                        K21.9, Gastro-esophageal reflux disease without                         esophagitis CPT copyright 2022 American Medical Association. All rights reserved. The codes documented in this report are preliminary and upon coder review may  be revised to meet current compliance requirements. Dr. Libby Maw Toney Reil MD, MD 10/18/2023 10:18:21 AM This report has been signed electronically. Number of Addenda: 0 Note Initiated On: 10/18/2023 9:58 AM Estimated Blood Loss:  Estimated blood loss: none.      [facility]

## 2023-10-18 NOTE — Anesthesia Preprocedure Evaluation (Signed)
Anesthesia Evaluation  Patient identified by MRN, date of birth, ID band Patient awake    Reviewed: Allergy & Precautions, NPO status , Patient's Chart, lab work & pertinent test results  Airway Mallampati: III  TM Distance: >3 FB Neck ROM: full    Dental  (+) Teeth Intact   Pulmonary neg pulmonary ROS, asthma    Pulmonary exam normal        Cardiovascular Exercise Tolerance: Good negative cardio ROS Normal cardiovascular exam Rhythm:Regular Rate:Normal     Neuro/Psych  Headaches negative neurological ROS  negative psych ROS   GI/Hepatic negative GI ROS, Neg liver ROS,GERD  ,,  Endo/Other  negative endocrine ROSHypothyroidism    Renal/GU negative Renal ROS  negative genitourinary   Musculoskeletal   Abdominal Normal abdominal exam  (+)   Peds negative pediatric ROS (+)  Hematology negative hematology ROS (+)   Anesthesia Other Findings Past Medical History: No date: Allergy No date: Asthma 09/29/2015: Cardiac murmur 09/29/2015: Embedded toenail 09/29/2015: Family planning No date: Fibromyalgia No date: GERD (gastroesophageal reflux disease) No date: Heart murmur 09/29/2015: Hemorrhoid No date: IBS (irritable bowel syndrome) No date: Thyroid disease  Past Surgical History: No date: ABDOMINAL HYSTERECTOMY No date: CESAREAN SECTION 12/29/2021: COLONOSCOPY WITH PROPOFOL; N/A     Comment:  Procedure: COLONOSCOPY WITH PROPOFOL;  Surgeon: Toney Reil, MD;  Location: [facility] ENDOSCOPY;  Service:               Gastroenterology;  Laterality: N/A; No date: KNEE ARTHROSCOPY No date: LAPAROSCOPIC TOTAL HYSTERECTOMY     Comment:  with BS. Dr. Tiburcio Pea [facility] 03/03/2016: LAPAROSCOPY; Right     Comment:  Procedure:  diagnostic laparoscopy and right               oophrectomy;  Surgeon: [place] Bing, MD;  Location:               [facility] ORS;  Service: Gynecology;  Laterality: Right;      Reproductive/Obstetrics negative OB ROS                             Anesthesia Physical Anesthesia Plan  ASA: 2  Anesthesia Plan: General   Post-op Pain Management:    Induction: Intravenous  PONV Risk Score and Plan: Propofol infusion and TIVA  Airway Management Planned: Natural Airway and Nasal Cannula  Additional Equipment:   Intra-op Plan:   Post-operative Plan:   Informed Consent: I have reviewed the patients History and Physical, chart, labs and discussed the procedure including the risks, benefits and alternatives for the proposed anesthesia with the patient or authorized representative who has indicated his/her understanding and acceptance.     Dental Advisory Given  Plan Discussed with: CRNA  Anesthesia Plan Comments:        Anesthesia Quick Evaluation

## 2023-10-18 NOTE — Anesthesia Procedure Notes (Addendum)
Procedure Name: General with mask airway Date/Time: 10/18/2023 10:10 AM  Performed by: Mohammed Kindle, CRNAPre-anesthesia Checklist: Patient identified, Emergency Drugs available, Suction available and Patient being monitored Oxygen Delivery Method: Simple face mask Induction Type: IV induction Placement Confirmation: positive ETCO2 and breath sounds checked- equal and bilateral Dental Injury: Teeth and Oropharynx as per pre-operative assessment

## 2023-10-18 NOTE — Anesthesia Postprocedure Evaluation (Signed)
Anesthesia Post Note  Patient: Brenda Price  Procedure(s) Performed: ESOPHAGOGASTRODUODENOSCOPY (EGD) WITH PROPOFOL BIOPSY  Patient location during evaluation: PACU Anesthesia Type: General Level of consciousness: awake and awake and alert Pain management: satisfactory to patient Vital Signs Assessment: post-procedure vital signs reviewed and stable Respiratory status: spontaneous breathing Cardiovascular status: stable Anesthetic complications: no   No notable events documented.   Last Vitals:  Vitals:   10/18/23 1030 10/18/23 1040  BP: 112/81 (!) 137/91  Pulse: 79 70  Resp: 15 14  Temp:    SpO2: 100% 100%    Last Pain:  Vitals:   10/18/23 1040  TempSrc:   PainSc: 0-No pain                 VAN STAVEREN,Henery Betzold

## 2023-10-21 ENCOUNTER — Telehealth: Payer: Self-pay

## 2023-10-21 LAB — SURGICAL PATHOLOGY

## 2023-10-21 MED ORDER — VONOPRAZAN FUMARATE 20 MG PO TABS: 1.0000 | ORAL_TABLET | Freq: Every day | ORAL | 1 refills | Status: DC

## 2023-10-21 NOTE — Telephone Encounter (Signed)
Patient verbalized understanding of results. Sent medication to [facility].

## 2023-10-21 NOTE — Telephone Encounter (Signed)
-----   Message from [facility] sent at 10/21/2023 12:30 PM EST ----- The pathology results from upper endoscopy came back normal.  Recommend to switch from Protonix to vonoprazan 20 mg daily for 8 weeks for refractory GERD  RV

## 2023-11-13 ENCOUNTER — Other Ambulatory Visit: Payer: Self-pay | Admitting: Internal Medicine

## 2023-11-13 DIAGNOSIS — E039 Hypothyroidism, unspecified: Secondary | ICD-10-CM

## 2023-11-18 NOTE — Telephone Encounter (Signed)
 Reordered 06/26/23 #90 1 RF Requested Prescriptions  Refused Prescriptions Disp Refills   levothyroxine  (SYNTHROID ) 50 MCG tablet [Pharmacy Med Name: Levothyroxine  Sodium 50 MCG Oral Tablet] 90 tablet 3    Sig: TAKE 1 TABLET BY MOUTH DAILY     Endocrinology:  Hypothyroid Agents Passed - 11/18/2023  9:51 AM      Passed - TSH in normal range and within 360 days    TSH  Date Value Ref Range Status  01/10/2023 3.280 0.450 - 4.500 uIU/mL Final         Passed - Valid encounter within last 12 months    Recent Outpatient Visits           4 months ago Seasonal allergic rhinitis, unspecified trigger   [facility] Bernardo Fend, DO   1 year ago Annual physical exam   [facility] Bernardo Fend, DO   1 year ago Cellulitis of left lower extremity   [facility] Leavy Mole, PA-C   2 years ago Screening for colon cancer   [facility] Bernardo Fend, DO   2 years ago Seasonal allergic rhinitis, unspecified trigger   [facility] Leavy Mole, PA-C       Future Appointments             In 3 weeks Jackquline Sawyer, MD [facility]   In 1 month Bernardo Fend, DO [facility]

## 2023-11-29 ENCOUNTER — Telehealth: Payer: Managed Care, Other (non HMO) | Admitting: Family Medicine

## 2023-11-29 DIAGNOSIS — R21 Rash and other nonspecific skin eruption: Secondary | ICD-10-CM | POA: Diagnosis not present

## 2023-11-29 MED ORDER — TRIAMCINOLONE ACETONIDE 0.025 % EX OINT: 1.0000 | TOPICAL_OINTMENT | Freq: Two times a day (BID) | CUTANEOUS | 0 refills | Status: AC

## 2023-11-29 NOTE — Patient Instructions (Addendum)
Brenda Price, thank you for joining Freddy Finner, NP for today's virtual visit.  While this provider is not your primary care provider (PCP), if your PCP is located in our provider database this encounter information will be shared with them immediately following your visit.   A [facility] MyChart account gives you access to today's visit and all your visits, tests, and labs performed at [facility] " click here if you don't have a [facility] MyChart account or go to [email or web address]  Consent: (Patient) Brenda Price provided verbal consent for this virtual visit at the beginning of the encounter.  Current Medications:  Current Outpatient Medications:    triamcinolone (KENALOG) 0.025 % ointment, Apply 1 Application topically 2 (two) times daily., Disp: 30 g, Rfl: 0   albuterol (PROVENTIL) (2.5 MG/3ML) 0.083% nebulizer solution, Take 3 mLs (2.5 mg total) by nebulization every 6 (six) hours as needed for wheezing or shortness of breath., Disp: 150 mL, Rfl: 1   albuterol (VENTOLIN HFA) 108 (90 Base) MCG/ACT inhaler, USE 2 INHALATIONS BY MOUTH EVERY 4 TO 6 HOURS AS NEEDED FOR  WHEEZING, Disp: 34 g, Rfl: 3   aspirin EC 81 MG tablet, Take 81 mg by mouth daily. Swallow whole., Disp: , Rfl:    atorvastatin (LIPITOR) 10 MG tablet, Take 1 tablet (10 mg total) by mouth daily., Disp: , Rfl:    baclofen (LIORESAL) 10 MG tablet, Take 1 tablet (10 mg total) by mouth 3 (three) times daily as needed for muscle spasms., Disp: 30 each, Rfl: 5   Galcanezumab-gnlm 120 MG/ML SOAJ, Inject 120 mg into the skin every 30 (thirty) days., Disp: , Rfl:    levocetirizine (XYZAL) 5 MG tablet, TAKE 1 TABLET BY MOUTH IN THE EVENING, Disp: 90 tablet, Rfl: 1   levothyroxine (SYNTHROID) 50 MCG tablet, Take 1 tablet (50 mcg total) by mouth daily., Disp: 90 tablet, Rfl: 1   linaclotide (LINZESS) 145 MCG CAPS capsule, Take 1 capsule (145 mcg total) by mouth daily before breakfast., Disp: 90 capsule, Rfl:  3   montelukast (SINGULAIR) 10 MG tablet, TAKE 1 TABLET(10 MG) BY MOUTH AT BEDTIME, Disp: 90 tablet, Rfl: 1   nortriptyline (PAMELOR) 25 MG capsule, Take 25 mg by mouth at bedtime., Disp: , Rfl:    ondansetron (ZOFRAN) 4 MG tablet, , Disp: , Rfl:    OXcarbazepine (TRILEPTAL) 150 MG tablet, Take 150 mg by mouth 2 (two) times daily., Disp: , Rfl:    pregabalin (LYRICA) 25 MG capsule, Take 25 mg by mouth daily., Disp: , Rfl:    promethazine (PHENERGAN) 12.5 MG tablet, Take 12.5 mg by mouth., Disp: , Rfl:    QULIPTA 30 MG TABS, Take 1 tablet by mouth daily., Disp: , Rfl:    UBRELVY 50 MG TABS, Take 50 mg by mouth as needed (Take 50 mg by mouth as directed 1 tablet as needed for headache rescue, can repeat dose in 1 - 2 hours). Take 50 mg by mouth as directed 1 tablet as needed for headache rescue, can repeat dose in 1 - 2 hours, Disp: , Rfl:    Vonoprazan Fumarate 20 MG TABS, Take 1 tablet by mouth daily before breakfast., Disp: 30 tablet, Rfl: 1   Medications ordered in this encounter:  Meds ordered this encounter  Medications   triamcinolone (KENALOG) 0.025 % ointment    Sig: Apply 1 Application topically 2 (two) times daily.    Dispense:  30 g    Refill:  0  Supervising Provider:   Merrilee Jansky [[id number]]     *If you need refills on other medications prior to your next appointment, please contact your pharmacy*  Follow-Up: Call back or seek an in-person evaluation if the symptoms worsen or if the condition fails to improve as anticipated.  [facility] [phone]  Other Instructions Rash, Adult  A rash is a breakout of spots or blotches on the skin. It can change the way your skin looks and feels. Many things can cause a rash. The goal of treatment is to stop the itching and keep the rash from spreading. Follow these instructions at home: Medicine Take or apply over-the-counter and prescription medicines only as told by your doctor. These may include medicines  to treat: Red or swollen skin. Itching. An allergy. Pain. An infection.  Skin care Put a cool, wet cloth on the rash. Do not scratch or rub your skin. Try not to cover the rash. Keep it exposed to air as often as you can. Managing itching and discomfort Avoid hot showers or baths. These can make itching worse. A cold shower may help. Try taking a bath with: Epsom salts. You can get these at your pharmacy or grocery store. Follow the instructions on the package. Baking soda. Pour a small amount into the bath as told by your doctor. Colloidal oatmeal. You can get this at your pharmacy or grocery store. Follow the instructions on the package. Try putting baking soda paste on your skin. Stir water into baking soda until it gets like a paste. Try putting on a lotion to help with itching (calamine lotion). Keep cool. Stay out of the sun. Sweating and being hot can make itching worse. General instructions  Rest as needed. Drink enough fluid to keep your pee (urine) pale yellow. Wear loose-fitting clothes. Avoid scented soaps, detergents, and perfumes. Use gentle soaps, detergents, perfumes, and cosmetics. Avoid the things that cause your rash. Keep a journal to help keep track of what causes your rash. Write down: What you eat. What cosmetics you use. What you drink. What you wear. This includes jewelry. Contact a doctor if: You sweat a lot at night. You pee (urinate) more or less than normal. Your pee is a darker color than normal. Your eyes are sensitive to light. Your skin or the white parts of your eyes turn yellow. Your skin tingles or is numb. You get painful blisters in your nose or mouth. Your rash does not go away after a few days, or it gets worse. You are more tired than normal. You are more thirsty than normal. You have new or worse symptoms. These may include: Pain in your belly. A fever. Watery poop (diarrhea). Vomiting. Weakness. Weight loss. Get help right  away if: You start to feel mixed up (confused). You have a very bad headache or a stiff neck. You have very bad joint pain or stiffness. You get very sleepy or not responsive. You have a seizure. This information is not intended to replace advice given to you by your health care provider. Make sure you discuss any questions you have with your health care provider. Document Revised: 08/17/2022 Document Reviewed: 08/17/2022 Elsevier Patient Education  2024 Elsevier Inc.   If you have been instructed to have an in-person evaluation today at a local Urgent Care facility, please use the link below. It will take you to a list of all of our available [facility], including address, phone number and hours  of operation. Please do not delay care.  [facility]  If you or a family member do not have a primary care provider, use the link below to schedule a visit and establish care. When you choose a [facility] primary care physician or advanced practice provider, you gain a long-term partner in health. Find a Primary Care Provider  Learn more about [facility]'s in-office and virtual care options: [facility] - Get Care Now

## 2023-11-29 NOTE — Progress Notes (Signed)
Virtual Visit Consent   Brenda Price, you are scheduled for a virtual visit with a [employer] provider today. Just as with appointments in the office, your consent must be obtained to participate. Your consent will be active for this visit and any virtual visit you may have with one of our providers in the next 365 days. If you have a MyChart account, a copy of this consent can be sent to you electronically.  As this is a virtual visit, video technology does not allow for your provider to perform a traditional examination. This may limit your provider's ability to fully assess your condition. If your provider identifies any concerns that need to be evaluated in person or the need to arrange testing (such as labs, EKG, etc.), we will make arrangements to do so. Although advances in technology are sophisticated, we cannot ensure that it will always work on either your end or our end. If the connection with a video visit is poor, the visit may have to be switched to a telephone visit. With either a video or telephone visit, we are not always able to ensure that we have a secure connection.  By engaging in this virtual visit, you consent to the provision of healthcare and authorize for your insurance to be billed (if applicable) for the services provided during this visit. Depending on your insurance coverage, you may receive a charge related to this service.  I need to obtain your verbal consent now. Are you willing to proceed with your visit today? Brenda Price has provided verbal consent on 11/29/2023 for a virtual visit (video or telephone). Freddy Finner, NP  Date: 11/29/2023 3:41 PM  Virtual Visit via Video Note   I, Freddy Finner, connected with  Brenda Price  ([id number], 12-22-1970) on 11/29/23 at  3:30 PM EST by a video-enabled telemedicine application and verified that I am speaking with the correct person using two identifiers.  Location: Patient: Virtual Visit Location Patient:  Home Provider: Virtual Visit Location Provider: Home Office   I discussed the limitations of evaluation and management by telemedicine and the availability of in person appointments. The patient expressed understanding and agreed to proceed.    History of Present Illness: Brenda Price is a 53 y.o. who identifies as a female who was assigned female at birth, and is being seen today for rash  Onset was in last few days- red bumps- itching and burning at times Associated symptoms are none Modifying factors are benadryl, washing area, calamine lotion Denies new hygiene products , foods, meds, shortness of breath, no blisters, drainage/bleeding, swelling, fevers, chills, signs of infection at the sites   Does report using essential oils in dry and a newer oil in there- but has used it in diffuser without issue.  Reports one of their chicken has mites.   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   Fungal infection of nail 09/29/2015   Allergic rhinitis, seasonal 09/29/2015   Functional bowel disorder 09/29/2015   Gastro-esophageal reflux disease without esophagitis 09/29/2015   Asthma 08/29/2015    Allergies:  Allergies  Allergen Reactions   Aspirin Other (See Comments)    Causes asthma to act up    Erythromycin Other (See Comments)    Stomach cramping    Medications:  Current Outpatient Medications:    triamcinolone (KENALOG) 0.025 % ointment, Apply 1 Application topically 2 (two) times daily., Disp: 30 g, Rfl: 0   albuterol (PROVENTIL) (2.5 MG/3ML) 0.083% nebulizer  solution, Take 3 mLs (2.5 mg total) by nebulization every 6 (six) hours as needed for wheezing or shortness of breath., Disp: 150 mL, Rfl: 1   albuterol (VENTOLIN HFA) 108 (90 Base) MCG/ACT inhaler, USE 2 INHALATIONS BY MOUTH EVERY 4 TO 6 HOURS AS NEEDED FOR  WHEEZING, Disp: 34 g, Rfl: 3   aspirin EC 81 MG tablet, Take 81 mg by mouth daily. Swallow whole., Disp: , Rfl:    atorvastatin (LIPITOR) 10 MG tablet, Take 1 tablet (10 mg total) by mouth daily., Disp: , Rfl:    baclofen (LIORESAL) 10 MG tablet, Take 1 tablet (10 mg total) by mouth 3 (three) times daily as needed for muscle spasms., Disp: 30 each, Rfl: 5   Galcanezumab-gnlm 120 MG/ML SOAJ, Inject 120 mg into the skin every 30 (thirty) days., Disp: , Rfl:    levocetirizine (XYZAL) 5 MG tablet, TAKE 1 TABLET BY MOUTH IN THE EVENING, Disp: 90 tablet, Rfl: 1   levothyroxine (SYNTHROID) 50 MCG tablet, Take 1 tablet (50 mcg total) by mouth daily., Disp: 90 tablet, Rfl: 1   linaclotide (LINZESS) 145 MCG CAPS capsule, Take 1 capsule (145 mcg total) by mouth daily before breakfast., Disp: 90 capsule, Rfl: 3   montelukast (SINGULAIR) 10 MG tablet, TAKE 1 TABLET(10 MG) BY MOUTH AT BEDTIME, Disp: 90 tablet, Rfl: 1   nortriptyline (PAMELOR) 25 MG capsule, Take 25 mg by mouth at bedtime., Disp: , Rfl:    ondansetron (ZOFRAN) 4 MG tablet, , Disp: , Rfl:    OXcarbazepine (TRILEPTAL) 150 MG tablet, Take 150 mg by mouth 2 (two) times daily., Disp: , Rfl:    pregabalin (LYRICA) 25 MG capsule, Take 25 mg by mouth daily., Disp: , Rfl:    promethazine (PHENERGAN) 12.5 MG tablet, Take 12.5 mg by mouth., Disp: , Rfl:    QULIPTA 30 MG TABS, Take 1 tablet by mouth daily., Disp: , Rfl:    UBRELVY 50 MG TABS, Take 50 mg by mouth as needed (Take 50 mg by mouth as directed 1 tablet as needed for headache rescue, can repeat dose in 1 - 2 hours). Take 50 mg by mouth as directed 1 tablet as needed for headache rescue, can repeat dose in 1 - 2 hours, Disp: , Rfl:    Vonoprazan  Fumarate 20 MG TABS, Take 1 tablet by mouth daily before breakfast., Disp: 30 tablet, Rfl: 1  Observations/Objective: Patient is well-developed, well-nourished in no acute distress.  Resting comfortably  at home.  Head is normocephalic, atraumatic.  No labored breathing.  Speech is clear and coherent with logical content.  Patient is alert and oriented at baseline.  Several bumps on wrist bilateral, one at bra line on right, and a few on chest- red and raised   Assessment and Plan: 1. Rash (Primary) - triamcinolone (KENALOG) 0.025 % ointment; Apply 1 Application topically 2 (two) times daily.  Dispense: 30 g; Refill: 0  -chicken mite bites vs contact rash -wash  -avoid new oil -apply ointment as directed -follow up if not improving or if infection occurs    Reviewed side effects, risks  and benefits of medication.    Patient acknowledged agreement and understanding of the plan.   Past Medical, Surgical, Social History, Allergies, and Medications have been Reviewed.    Follow Up Instructions: I discussed the assessment and treatment plan with the patient. The patient was provided an opportunity to ask questions and all were answered. The patient agreed with the plan and demonstrated an understanding of the instructions.  A copy of instructions were sent to the patient via MyChart unless otherwise noted below.    The patient was advised to call back or seek an in-person evaluation if the symptoms worsen or if the condition fails to improve as anticipated.    Freddy Finner, NP

## 2023-12-01 ENCOUNTER — Ambulatory Visit
Admission: RE | Admit: 2023-12-01 | Discharge: 2023-12-01 | Disposition: A | Discharge status: Home / Self Care | Payer: Managed Care, Other (non HMO) | Source: Ambulatory Visit | Attending: Emergency Medicine | Admitting: Emergency Medicine

## 2023-12-01 VITALS — BP 137/84 | HR 81 | Temp 98.2°F | Resp 16

## 2023-12-01 DIAGNOSIS — R21 Rash and other nonspecific skin eruption: Secondary | ICD-10-CM

## 2023-12-01 MED ORDER — PREDNISONE 10 MG (21) PO TBPK: ORAL_TABLET | Freq: Every day | ORAL | 0 refills | Status: DC

## 2023-12-01 NOTE — ED Provider Notes (Signed)
Renaldo Fiddler    CSN: [id number] Arrival date & time: 12/01/23  0940      History   Chief Complaint Chief Complaint  Patient presents with   Rash    Wednesday 1 or 2 small red bumps that burn and itch. Few more Thursday then several more Friday. Had virtual visit yesterday. Cream provided isn't helping. Continuing to have new bumps. Seems to follow pattern with previous shingles. Overall fatigue - Entered by patient    HPI Brenda Price is a 53 y.o. female.   Patient presents for evaluation of a erythematous papular rash present for 4 days.  Initially noticed to the left side of the neck, has spread to the bilateral arm, bilateral leg and groin.  Associated increased fatigue.  Denies changes in toiletries, diet medication.  No close contact with similar symptoms.  Has attempted use of oral and topical Benadryl and triamcinolone cream given by e-visit, has been helpful but symptoms continue to persist.  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/18/2023   Chronic GERD without esophagitis 10/18/2023   Colon cancer screening    Adenomatous polyp of transverse colon    Fibromyalgia syndrome 10/28/2020   Bilateral occipital neuralgia 08/21/2018   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   Adult hypothyroidism 09/29/2015   Headache, migraine 09/29/2015   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   Fungal infection of nail 09/29/2015   Allergic rhinitis, seasonal 09/29/2015   Functional bowel disorder 09/29/2015   Gastro-esophageal reflux disease without esophagitis 09/29/2015    Asthma 08/29/2015    Past Surgical History:  Procedure Laterality Date   ABDOMINAL HYSTERECTOMY     BIOPSY  10/18/2023   Procedure: BIOPSY;  Surgeon: Toney Reil, MD;  Location: [facility] ENDOSCOPY;  Service: Gastroenterology;;   CESAREAN SECTION     COLONOSCOPY WITH PROPOFOL N/A 12/29/2021   Procedure: COLONOSCOPY WITH PROPOFOL;  Surgeon: Toney Reil, MD;  Location: [facility] ENDOSCOPY;  Service: Gastroenterology;  Laterality: N/A;   ESOPHAGOGASTRODUODENOSCOPY (EGD) WITH PROPOFOL N/A 10/18/2023   Procedure: ESOPHAGOGASTRODUODENOSCOPY (EGD) WITH PROPOFOL;  Surgeon: Toney Reil, MD;  Location: [facility] ENDOSCOPY;  Service: Gastroenterology;  Laterality: N/A;   KNEE ARTHROSCOPY     LAPAROSCOPIC TOTAL HYSTERECTOMY     with BS. Dr. Tretha Sciara OBGYN   LAPAROSCOPY Right 03/03/2016   Procedure:  diagnostic laparoscopy and right oophrectomy;  Surgeon: Marrowstone Bing, MD;  Location: [facility] ORS;  Service: Gynecology;  Laterality: Right;    OB History     Gravida  2   Para  1   Term      Preterm      AB      Living         SAB      IAB      Ectopic      Multiple      Live Births               Home Medications    Prior to Admission medications  Medication Sig Start Date End Date Taking? Authorizing Provider  albuterol (PROVENTIL) (2.5 MG/3ML) 0.083% nebulizer solution Take 3 mLs (2.5 mg total) by nebulization every 6 (six) hours as needed for wheezing or shortness of breath. 10/22/19   Doren Custard, FNP  albuterol (VENTOLIN HFA) 108 (90 Base) MCG/ACT inhaler USE 2 INHALATIONS BY MOUTH EVERY 4 TO 6 HOURS AS NEEDED FOR  WHEEZING 12/12/22   Margarita Mail, DO  aspirin EC 81 MG tablet Take 81 mg by mouth daily. Swallow whole.    [provider]  atorvastatin (LIPITOR) 10 MG tablet Take 1 tablet (10 mg total) by mouth daily. 06/26/23   Margarita Mail, DO  baclofen (LIORESAL) 10 MG tablet Take 1 tablet (10 mg total) by mouth 3 (three) times  daily as needed for muscle spasms. 10/11/17   Trey Sailors, PA-C  Galcanezumab-gnlm 120 MG/ML SOAJ Inject 120 mg into the skin every 30 (thirty) days.    [provider]  levocetirizine (XYZAL) 5 MG tablet TAKE 1 TABLET BY MOUTH IN THE EVENING 06/26/23   Margarita Mail, DO  levothyroxine (SYNTHROID) 50 MCG tablet Take 1 tablet (50 mcg total) by mouth daily. 06/26/23   Margarita Mail, DO  linaclotide [facility]) 145 MCG CAPS capsule Take 1 capsule (145 mcg total) by mouth daily before breakfast. 07/01/23   Toney Reil, MD  montelukast (SINGULAIR) 10 MG tablet TAKE 1 TABLET(10 MG) BY MOUTH AT BEDTIME 10/17/23   Margarita Mail, DO  nortriptyline (PAMELOR) 25 MG capsule Take 25 mg by mouth at bedtime.    [provider]  ondansetron (ZOFRAN) 4 MG tablet  10/16/19   [provider]  OXcarbazepine (TRILEPTAL) 150 MG tablet Take 150 mg by mouth 2 (two) times daily. 03/31/21   [provider]  predniSONE (STERAPRED UNI-PAK 21 TAB) 10 MG (21) TBPK tablet Take by mouth daily. Take 6 tabs by mouth daily  for 1 days, then 5 tabs for 1 days, then 4 tabs for 1 days, then 3 tabs for 1 days, 2 tabs for 1 days, then 1 tab by mouth daily for 1 days 12/01/23   Valinda Hoar, NP  pregabalin (LYRICA) 25 MG capsule Take 25 mg by mouth daily. 02/06/23   [provider]  promethazine (PHENERGAN) 12.5 MG tablet Take 12.5 mg by mouth. 10/16/19   [provider]  QULIPTA 30 MG TABS Take 1 tablet by mouth daily. 06/07/23   [provider]  triamcinolone (KENALOG) 0.025 % ointment Apply 1 Application topically 2 (two) times daily. 11/29/23   Freddy Finner, NP  UBRELVY 50 MG TABS Take 50 mg by mouth as needed (Take 50 mg by mouth as directed 1 tablet as needed for headache rescue, can repeat dose in 1 - 2 hours). Take 50 mg by mouth as directed 1 tablet as needed for headache rescue, can repeat dose in 1 - 2 hours 06/07/23   [provider]   Vonoprazan Fumarate 20 MG TABS Take 1 tablet by mouth daily before breakfast. 10/21/23   Vanga, Loel Dubonnet, MD    Family History Family History  Problem Relation Age of Onset   Heart attack Father    Arthritis Mother    Hyperlipidemia Mother    Thyroid disease Brother    Breast cancer Neg Hx     Social History Social History   Tobacco Use   Smoking status: Never   Smokeless tobacco: Never  Vaping Use   Vaping status: Never Used  Substance Use Topics   Alcohol use: Not Currently    Comment: occasional   Drug use: No     Allergies   Aspirin and Erythromycin   Review of Systems Review of Systems  Skin:  Positive for rash.     Physical Exam Triage Vital Signs ED Triage Vitals [12/01/23 1013]  Encounter Vitals Group     BP 137/84     Systolic BP Percentile      Diastolic BP Percentile      Pulse Rate 81     Resp 16     Temp 98.2 F (36.8 C)     Temp Source Oral     SpO2 99 %     Weight      Height      Head Circumference      Peak Flow      Pain Score      Pain Loc      Pain Education      Exclude from Growth Chart    No data found.  Updated Vital Signs BP 137/84 (BP Location: Left Arm)   Pulse 81   Temp 98.2 F (36.8 C) (Oral)   Resp 16   SpO2 99%   Visual Acuity Right Eye Distance:   Left Eye Distance:   Bilateral Distance:    Right Eye Near:   Left Eye Near:    Bilateral Near:     Physical Exam Constitutional:      Appearance: Normal appearance.  Eyes:     Extraocular Movements: Extraocular movements intact.  Pulmonary:     Effort: Pulmonary effort is normal.  Skin:    Comments: Less than 0.5 cm erythematous and flesh tone papular lesions present to the bilateral wrist, left side of neck, the posterior of the bilateral knee into the bilateral groin  Neurological:     Mental Status: She is alert and oriented to person, place, and time. Mental status is at baseline.      UC Treatments / Results  Labs (all labs ordered are  listed, but only abnormal results are displayed) Labs Reviewed - No data to display  EKG   Radiology No results found.  Procedures Procedures (including critical care time)  Medications Ordered in UC Medications - No data to display  Initial Impression / Assessment and Plan / UC Course  I have reviewed the triage vital signs and the nursing notes.  Pertinent labs & imaging results that were available during my care of the patient were reviewed by me and considered in my medical decision making (see chart for details).  Rash  Appears inflammatory in cause, no signs of infection at this time, discussed with patient, prescribed oral prednisone course recommended additional supportive care, advised against long exposure to heat to prevent further irritation advised follow-up for persisting or worsening symptoms Final Clinical Impressions(s) / UC Diagnoses   Final diagnoses:  Rash     Discharge Instructions      Today you are being treated for a rash which appears to be inflammatory, no signs of infection at this time, not consistent with shingles as it is on several sides of the body  Begin prednisone every morning with food as directed, to continue the above process  You may continue use of topical calamine or Benadryl cream to help manage itching, you may also continue oral Benadryl  Please avoid long exposures to heat such as a hot steamy shower or being outside as this may cause further irritation  to your rash  You may follow-up with his urgent care as needed if symptoms persist or worsen    ED Prescriptions     Medication Sig Dispense Auth. Provider   predniSONE (STERAPRED UNI-PAK 21 TAB) 10 MG (21) TBPK tablet  (Status: Discontinued) Take by mouth daily. Take 6 tabs by mouth daily  for 1 days, then 5 tabs for 1 days, then 4 tabs for 1 days, then 3 tabs for 1 days, 2 tabs for 1 days, then 1 tab by mouth daily for 1 days 21 tablet Isom Kochan R, NP   predniSONE  (STERAPRED UNI-PAK 21 TAB) 10 MG (21) TBPK tablet Take by mouth daily. Take 6 tabs by mouth daily  for 1 days, then 5 tabs for 1 days, then 4 tabs for 1 days, then 3 tabs for 1 days, 2 tabs for 1 days, then 1 tab by mouth daily for 1 days 21 tablet Patra Gherardi, Elita Boone, NP      PDMP not reviewed this encounter.   Valinda Hoar, NP 12/01/23 1047

## 2023-12-01 NOTE — Discharge Instructions (Signed)
Today you are being treated for a rash which appears to be inflammatory, no signs of infection at this time, not consistent with shingles as it is on several sides of the body  Begin prednisone every morning with food as directed, to continue the above process  You may continue use of topical calamine or Benadryl cream to help manage itching, you may also continue oral Benadryl  Please avoid long exposures to heat such as a hot steamy shower or being outside as this may cause further irritation to your rash  You may follow-up with his urgent care as needed if symptoms persist or worsen

## 2023-12-10 ENCOUNTER — Ambulatory Visit: Payer: Managed Care, Other (non HMO) | Admitting: Dermatology

## 2023-12-11 ENCOUNTER — Encounter: Payer: Self-pay | Admitting: Gastroenterology

## 2023-12-12 ENCOUNTER — Encounter: Payer: Self-pay | Admitting: Gastroenterology

## 2023-12-27 ENCOUNTER — Ambulatory Visit: Payer: Managed Care, Other (non HMO) | Admitting: Internal Medicine

## 2023-12-27 ENCOUNTER — Encounter: Payer: Self-pay | Admitting: Internal Medicine

## 2023-12-27 ENCOUNTER — Other Ambulatory Visit: Payer: Self-pay

## 2023-12-27 VITALS — BP 110/74 | HR 82 | Temp 97.7°F | Resp 16 | Ht 64.0 in | Wt 140.9 lb

## 2023-12-27 DIAGNOSIS — Z1231 Encounter for screening mammogram for malignant neoplasm of breast: Secondary | ICD-10-CM

## 2023-12-27 DIAGNOSIS — Z23 Encounter for immunization: Secondary | ICD-10-CM | POA: Diagnosis not present

## 2023-12-27 DIAGNOSIS — E782 Mixed hyperlipidemia: Secondary | ICD-10-CM

## 2023-12-27 DIAGNOSIS — E039 Hypothyroidism, unspecified: Secondary | ICD-10-CM | POA: Diagnosis not present

## 2023-12-27 DIAGNOSIS — J302 Other seasonal allergic rhinitis: Secondary | ICD-10-CM

## 2023-12-27 DIAGNOSIS — J45909 Unspecified asthma, uncomplicated: Secondary | ICD-10-CM

## 2023-12-27 HISTORY — DX: Mixed hyperlipidemia: E78.2

## 2023-12-27 MED ORDER — ALBUTEROL SULFATE HFA 108 (90 BASE) MCG/ACT IN AERS: INHALATION_SPRAY | RESPIRATORY_TRACT | 3 refills | Status: AC

## 2023-12-27 NOTE — Assessment & Plan Note (Signed)
Check lipid panel now that she's been on Lipitor.

## 2023-12-27 NOTE — Assessment & Plan Note (Signed)
Recheck labs

## 2023-12-27 NOTE — Assessment & Plan Note (Signed)
Stable, on anti-histamine and Singulair.

## 2023-12-27 NOTE — Progress Notes (Signed)
Established Patient Office Visit  Subjective   Patient ID: Brenda Price, female    DOB: 08/18/1971  Age: 53 y.o. MRN: [id number]  Chief Complaint  Patient presents with   Medical Management of Chronic Issues    6 month recheck    HPI  Patient presents to follow up on chronic medical conditions.  Hypothyroidism: -Medications: Levothyroxine 50 mcg -Patient is compliant with the above medication (s) at the above dose and reports no medication side effects.  -Denies weight changes, cold/heat intolerance, skin changes, anxiety/palpitations  -Last TSH: 2/24 3.280   HLD/Hx of Lacunar Stroke: -Medications: Lipitor 10 mg, new since labs -Compliant with medication and reports no side effects -Last lipid panel: Lipid Panel     Component Value Date/Time   CHOL 255 (H) 01/10/2023 0807   TRIG 72 01/10/2023 0807   HDL 98 01/10/2023 0807   CHOLHDL 2.6 01/10/2023 0807   LDLCALC 145 (H) 01/10/2023 0807   LABVLDL 12 01/10/2023 0807   Seasonal Allergies/Asthma: -Currently on Albuterol PRN, Xyzal 5 mg at night, Singulair. Sometimes has to use Albuterol at night but not in the last several weeks.  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   Hypothyroidism 09/29/2015   Headache, migraine 09/29/2015   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   Fungal infection of nail 09/29/2015   Allergic rhinitis, seasonal 09/29/2015   Functional bowel disorder 09/29/2015    Gastro-esophageal reflux disease without esophagitis 09/29/2015   Asthma 08/29/2015   Past Medical History:  Diagnosis Date   Allergy    Asthma    Cardiac murmur 09/29/2015   Embedded toenail 09/29/2015   Family planning 09/29/2015   Fibromyalgia    GERD (gastroesophageal reflux disease)    Headache    Heart murmur    Hemorrhoid 09/29/2015   IBS (irritable bowel syndrome)    Mixed hyperlipidemia 12/27/2023   Pituitary cyst (HCC)    Thyroid disease    Past Surgical History:  Procedure Laterality Date   ABDOMINAL HYSTERECTOMY     BIOPSY  10/18/2023   Procedure: BIOPSY;  Surgeon: Toney Reil, MD;  Location: [facility] ENDOSCOPY;  Service: Gastroenterology;;   CESAREAN SECTION     COLONOSCOPY WITH PROPOFOL N/A 12/29/2021   Procedure: COLONOSCOPY WITH PROPOFOL;  Surgeon: Toney Reil, MD;  Location: [facility] ENDOSCOPY;  Service: Gastroenterology;  Laterality: N/A;   ESOPHAGOGASTRODUODENOSCOPY (EGD) WITH PROPOFOL N/A 10/18/2023   Procedure: ESOPHAGOGASTRODUODENOSCOPY (EGD) WITH PROPOFOL;  Surgeon: Toney Reil, MD;  Location: [facility] ENDOSCOPY;  Service: Gastroenterology;  Laterality: N/A;   KNEE ARTHROSCOPY     LAPAROSCOPIC TOTAL HYSTERECTOMY     with BS. Dr. Tretha Sciara OBGYN   LAPAROSCOPY Right 03/03/2016   Procedure:  diagnostic laparoscopy and right oophrectomy;  Surgeon: Lance Creek Bing, MD;  Location: [facility] ORS;  Service: Gynecology;  Laterality: Right;   Social History   Tobacco Use   Smoking status: Never   Smokeless tobacco: Never  Vaping Use   Vaping status: Never Used  Substance Use Topics   Alcohol use: Not Currently    Comment: occasional   Drug use: No   Social History   Socioeconomic History   Marital status: Married    Spouse name: Thayer Ohm   Number of children: 2   Years of education: Not on file   Highest education level: Not on file  Occupational History   Not on file  Tobacco Use   Smoking status: Never   Smokeless tobacco: Never   Vaping Use   Vaping status: Never Used  Substance and Sexual Activity   Alcohol use: Not Currently    Comment: occasional   Drug use: No   Sexual activity: Yes    Partners: Female    Birth control/protection: None    Comment: Hysterectomy  Other Topics Concern   Not on file  Social History Narrative   Not on file   Social Drivers of Health   Financial Resource Strain: Low Risk  (11/09/2022)   Overall Financial Resource Strain (CARDIA)    Difficulty of Paying Living Expenses: Not hard at all  Food Insecurity: No Food Insecurity (11/09/2022)   Hunger Vital Sign    Worried About Running Out of Food in the Last Year: Never true    Ran Out of Food in the Last Year: Never true  Transportation Needs: No Transportation Needs (11/08/2021)   PRAPARE - Administrator, Civil Service (Medical): No    Lack of Transportation (Non-Medical): No  Physical Activity: Insufficiently Active (11/09/2022)   Exercise Vital Sign    Days of Exercise per Week: 2 days    Minutes of Exercise per Session: 20 min  Stress: Stress Concern Present (11/09/2022)   [employer] of Occupational Health - Occupational Stress Questionnaire    Feeling of Stress : To some extent  Social Connections: Moderately Integrated (11/09/2022)   Social Connection and Isolation Panel [NHANES]    Frequency of Communication with Friends and Family: Twice a week    Frequency of Social Gatherings with Friends and Family: Twice a week    Attends Religious Services: 1 to 4 times per year    Active Member of [employer] or Organizations: No    Attends Banker Meetings: Never    Marital Status: Married  Catering manager Violence: Not At Risk (11/09/2022)   Humiliation, Afraid, Rape, and Kick questionnaire    Fear of Current or Ex-Partner: No    Emotionally Abused: No    Physically Abused: No    Sexually Abused: No   Family Status  Relation Name Status   Father  Deceased   Mother Mom Alive   Sister   Alive   Brother  Alive   Neg Hx  (Not Specified)  No partnership data on file   Family History  Problem Relation Age of Onset   Heart attack Father    Arthritis Mother    Hyperlipidemia Mother    Thyroid disease Brother    Breast cancer Neg Hx    Allergies  Allergen Reactions   Aspirin Other (See Comments)    Causes asthma to act up    Erythromycin Other (See Comments)    Stomach cramping    Vonoprazan Hives and Itching      Review of Systems  All other systems reviewed and are negative.     Objective:     BP 110/74 (Cuff Size: Normal)   Pulse 82   Temp 97.7 F (36.5 C) (Oral)   Resp 16  Ht 5\' 4"  (1.626 m)   Wt 140 lb 14.4 oz (63.9 kg)   SpO2 96%   BMI 24.19 kg/m  BP Readings from Last 3 Encounters:  12/27/23 110/74  12/01/23 137/84  10/18/23 (!) 137/91   Wt Readings from Last 3 Encounters:  12/27/23 140 lb 14.4 oz (63.9 kg)  10/18/23 141 lb (64 kg)  07/01/23 135 lb 8 oz (61.5 kg)      Physical Exam Constitutional:      Appearance: Normal appearance.  HENT:     Head: Normocephalic and atraumatic.  Eyes:     Conjunctiva/sclera: Conjunctivae normal.  Neck:     Comments: No thyromegaly Cardiovascular:     Rate and Rhythm: Normal rate and regular rhythm.  Pulmonary:     Effort: Pulmonary effort is normal.     Breath sounds: Normal breath sounds.  Musculoskeletal:     Cervical back: No tenderness.  Lymphadenopathy:     Cervical: No cervical adenopathy.  Skin:    General: Skin is warm and dry.  Neurological:     General: No focal deficit present.     Mental Status: She is alert. Mental status is at baseline.  Psychiatric:        Mood and Affect: Mood normal.        Behavior: Behavior normal.      No results found for any visits on 12/27/23.  Last CBC Lab Results  Component Value Date   WBC 3.7 01/10/2023   HGB 13.3 01/10/2023   HCT 39.9 01/10/2023   MCV 88 01/10/2023   MCH 29.4 01/10/2023   RDW 13.2 01/10/2023   PLT 228  01/10/2023   Last metabolic panel Lab Results  Component Value Date   GLUCOSE 72 01/10/2023   NA 138 01/10/2023   K 4.4 01/10/2023   CL 99 01/10/2023   CO2 25 01/10/2023   BUN 7 01/10/2023   CREATININE 0.84 01/10/2023   EGFR 85 01/10/2023   CALCIUM 9.1 01/10/2023   PROT 6.7 01/10/2023   ALBUMIN 4.6 01/10/2023   LABGLOB 2.1 01/10/2023   AGRATIO 2.2 01/10/2023   BILITOT 0.4 01/10/2023   ALKPHOS 83 01/10/2023   AST 26 01/10/2023   ALT 17 01/10/2023   ANIONGAP 10 04/08/2020   Last lipids Lab Results  Component Value Date   CHOL 255 (H) 01/10/2023   HDL 98 01/10/2023   LDLCALC 145 (H) 01/10/2023   TRIG 72 01/10/2023   CHOLHDL 2.6 01/10/2023   Last hemoglobin A1c No results found for: "HGBA1C" Last thyroid functions Lab Results  Component Value Date   TSH 3.280 01/10/2023   T4TOTAL 8.9 06/13/2021   Last vitamin D Lab Results  Component Value Date   VD25OH 45.8 01/10/2023   Last vitamin B12 and Folate Lab Results  Component Value Date   VITAMINB12 917 11/03/2019   FOLATE >20.0 11/03/2019      The 10-year ASCVD risk score (Arnett DK, et al., 2019) is: 0.7%    Assessment & Plan:  Asthma, unspecified asthma severity, unspecified whether complicated, unspecified whether persistent Assessment & Plan: Symptoms stable, will refill Albuterol.   Orders: -     CBC with Differential/Platelet -     Comprehensive metabolic panel -     Albuterol Sulfate HFA; USE 2 INHALATIONS BY MOUTH EVERY 4 TO 6 HOURS AS NEEDED FOR  WHEEZING  Dispense: 34 g; Refill: 3  Seasonal allergic rhinitis, unspecified trigger Assessment & Plan: Stable, on anti-histamine and Singulair.   Hypothyroidism, unspecified type  Assessment & Plan: Recheck labs.   Orders: -     TSH  Mixed hyperlipidemia Assessment & Plan: Check lipid panel now that she's been on Lipitor.   Orders: -     Lipid panel  Encounter for screening mammogram for malignant neoplasm of breast -     3D Screening  Mammogram, Left and Right; Future  Vaccine for streptococcus pneumoniae and influenza -     Pneumococcal conjugate vaccine 20-valent     Return in about 6 months (around 06/25/2024).    Margarita Mail, DO

## 2023-12-27 NOTE — Assessment & Plan Note (Signed)
Symptoms stable, will refill Albuterol.

## 2024-02-11 ENCOUNTER — Other Ambulatory Visit: Payer: Self-pay | Admitting: Internal Medicine

## 2024-02-11 DIAGNOSIS — D649 Anemia, unspecified: Secondary | ICD-10-CM

## 2024-02-11 LAB — LIPID PANEL
Chol/HDL Ratio: 2.1 ratio (ref 0.0–4.4)
Cholesterol, Total: 189 mg/dL (ref 100–199)
HDL: 92 mg/dL (ref >39)
LDL Chol Calc (NIH): 83 mg/dL (ref 0–99)
Triglycerides: 76 mg/dL (ref 0–149)
VLDL Cholesterol Cal: 14 mg/dL (ref 5–40)

## 2024-02-11 LAB — CBC WITH DIFFERENTIAL/PLATELET
Basophils Absolute: 0.1 10*3/uL (ref 0.0–0.2)
Basos: 2 %
EOS (ABSOLUTE): 0.3 10*3/uL (ref 0.0–0.4)
Eos: 9 %
Hematocrit: 35.1 % (ref 34.0–46.6)
Hemoglobin: 10.7 g/dL — ABNORMAL LOW (ref 11.1–15.9)
Immature Grans (Abs): 0 10*3/uL (ref 0.0–0.1)
Immature Granulocytes: 0 %
Lymphocytes Absolute: 1.6 10*3/uL (ref 0.7–3.1)
Lymphs: 43 %
MCH: 23.7 pg — ABNORMAL LOW (ref 26.6–33.0)
MCHC: 30.5 g/dL — ABNORMAL LOW (ref 31.5–35.7)
MCV: 78 fL — ABNORMAL LOW (ref 79–97)
Monocytes Absolute: 0.3 10*3/uL (ref 0.1–0.9)
Monocytes: 8 %
Neutrophils Absolute: 1.4 10*3/uL (ref 1.4–7.0)
Neutrophils: 38 %
Platelets: 255 10*3/uL (ref 150–450)
RBC: 4.51 x10E6/uL (ref 3.77–5.28)
RDW: 15 % (ref 11.7–15.4)
WBC: 3.6 10*3/uL (ref 3.4–10.8)

## 2024-02-11 LAB — COMPREHENSIVE METABOLIC PANEL WITH GFR
ALT: 20 IU/L (ref 0–32)
AST: 26 IU/L (ref 0–40)
Albumin: 4.6 g/dL (ref 3.8–4.9)
Alkaline Phosphatase: 102 IU/L (ref 44–121)
BUN/Creatinine Ratio: 12 (ref 9–23)
BUN: 11 mg/dL (ref 6–24)
Bilirubin Total: 0.5 mg/dL (ref 0.0–1.2)
CO2: 25 mmol/L (ref 20–29)
Calcium: 9.7 mg/dL (ref 8.7–10.2)
Chloride: 101 mmol/L (ref 96–106)
Creatinine, Ser: 0.92 mg/dL (ref 0.57–1.00)
Globulin, Total: 2.1 g/dL (ref 1.5–4.5)
Glucose: 76 mg/dL (ref 70–99)
Potassium: 3.9 mmol/L (ref 3.5–5.2)
Sodium: 139 mmol/L (ref 134–144)
Total Protein: 6.7 g/dL (ref 6.0–8.5)
eGFR: 75 mL/min/{1.73_m2} (ref >59)

## 2024-02-11 LAB — TSH: TSH: 2.24 u[IU]/mL (ref 0.450–4.500)

## 2024-02-17 ENCOUNTER — Encounter: Payer: Self-pay | Admitting: Internal Medicine

## 2024-02-22 ENCOUNTER — Other Ambulatory Visit: Payer: Self-pay | Admitting: Internal Medicine

## 2024-02-22 DIAGNOSIS — J302 Other seasonal allergic rhinitis: Secondary | ICD-10-CM

## 2024-02-24 ENCOUNTER — Encounter: Payer: Self-pay | Admitting: Internal Medicine

## 2024-02-24 ENCOUNTER — Other Ambulatory Visit: Payer: Self-pay

## 2024-02-24 DIAGNOSIS — J302 Other seasonal allergic rhinitis: Secondary | ICD-10-CM

## 2024-02-24 MED ORDER — LEVOCETIRIZINE DIHYDROCHLORIDE 5 MG PO TABS: ORAL_TABLET | ORAL | 1 refills | Status: DC

## 2024-02-24 NOTE — Telephone Encounter (Signed)
 Requested Prescriptions  Pending Prescriptions Disp Refills   levocetirizine (XYZAL) 5 MG tablet [Pharmacy Med Name: LEVOCETIRIZINE 5MG  TABLETS] 90 tablet 1    Sig: TAKE 1 TABLET BY MOUTH IN THE EVENING     Ear, Nose, and Throat:  Antihistamines - levocetirizine dihydrochloride Passed - 02/24/2024  3:10 PM      Passed - Cr in normal range and within 360 days    Creatinine, Ser  Date Value Ref Range Status  02/10/2024 0.92 0.57 - 1.00 mg/dL Final         Passed - eGFR is 10 or above and within 360 days    GFR calc Af Amer  Date Value Ref Range Status  11/09/2020 104 >59 mL/min/1.73 Final    Comment:    **In accordance with recommendations from the NKF-ASN Task force,**   [facility] is in the process of updating its eGFR calculation to the   2021 CKD-EPI creatinine equation that estimates kidney function   without a race variable.    GFR calc non Af Amer  Date Value Ref Range Status  11/09/2020 90 >59 mL/min/1.73 Final   eGFR  Date Value Ref Range Status  02/10/2024 75 >59 mL/min/1.73 Final         Passed - Valid encounter within last 12 months    Recent Outpatient Visits           1 month ago Asthma, unspecified asthma severity, unspecified whether complicated, unspecified whether persistent   [facility] Rockney Cid, DO       Future Appointments             In 4 weeks Artemio Larry, MD [facility]   In 4 months Rockney Cid, DO [facility]

## 2024-03-24 ENCOUNTER — Ambulatory Visit: Payer: Managed Care, Other (non HMO) | Admitting: Dermatology

## 2024-03-25 ENCOUNTER — Ambulatory Visit: Admitting: Dermatology

## 2024-03-25 DIAGNOSIS — B078 Other viral warts: Secondary | ICD-10-CM

## 2024-03-25 DIAGNOSIS — D229 Melanocytic nevi, unspecified: Secondary | ICD-10-CM

## 2024-03-25 DIAGNOSIS — W908XXA Exposure to other nonionizing radiation, initial encounter: Secondary | ICD-10-CM

## 2024-03-25 DIAGNOSIS — B070 Plantar wart: Secondary | ICD-10-CM

## 2024-03-25 DIAGNOSIS — L578 Other skin changes due to chronic exposure to nonionizing radiation: Secondary | ICD-10-CM

## 2024-03-25 DIAGNOSIS — S30860A Insect bite (nonvenomous) of lower back and pelvis, initial encounter: Secondary | ICD-10-CM

## 2024-03-25 DIAGNOSIS — D239 Other benign neoplasm of skin, unspecified: Secondary | ICD-10-CM

## 2024-03-25 DIAGNOSIS — L84 Corns and callosities: Secondary | ICD-10-CM

## 2024-03-25 DIAGNOSIS — L821 Other seborrheic keratosis: Secondary | ICD-10-CM

## 2024-03-25 DIAGNOSIS — Z1283 Encounter for screening for malignant neoplasm of skin: Secondary | ICD-10-CM

## 2024-03-25 DIAGNOSIS — L814 Other melanin hyperpigmentation: Secondary | ICD-10-CM

## 2024-03-25 DIAGNOSIS — D2371 Other benign neoplasm of skin of right lower limb, including hip: Secondary | ICD-10-CM

## 2024-03-25 DIAGNOSIS — D1801 Hemangioma of skin and subcutaneous tissue: Secondary | ICD-10-CM

## 2024-03-25 DIAGNOSIS — D2262 Melanocytic nevi of left upper limb, including shoulder: Secondary | ICD-10-CM

## 2024-03-25 DIAGNOSIS — D2271 Melanocytic nevi of right lower limb, including hip: Secondary | ICD-10-CM

## 2024-03-25 DIAGNOSIS — L858 Other specified epidermal thickening: Secondary | ICD-10-CM

## 2024-03-25 NOTE — Progress Notes (Signed)
 New Patient Visit   Subjective  Brenda Price is a 53 y.o. female who presents for the following: Skin Cancer Screening and Full Body Skin Exam. Patient reports possible plantar wart at bottom of L foot present x many years. Patient denies any personal or family history of skin cancers.  The patient presents for Total-Body Skin Exam (TBSE) for skin cancer screening and mole check. The patient has spots, moles and lesions to be evaluated, some may be new or changing and the patient may have concern these could be cancer.   The following portions of the chart were reviewed this encounter and updated as appropriate: medications, allergies, medical history  Review of Systems:  No other skin or systemic complaints except as noted in HPI or Assessment and Plan.  Objective  Well appearing patient in no apparent distress; mood and affect are within normal limits.  A full examination was performed including scalp, head, eyes, ears, nose, lips, neck, chest, axillae, abdomen, back, buttocks, bilateral upper extremities, bilateral lower extremities, hands, feet, fingers, toes, fingernails, and toenails. All findings within normal limits unless otherwise noted below.   Relevant physical exam findings are noted in the Assessment and Plan.  L lateral plantar foot x1 .6 cm firm keratotic papule  -- Discussed viral etiology and contagion. BL plantar foot bl medial feet at gt toes, firm hyperkeratotic plaques  Assessment & Plan   SKIN CANCER SCREENING PERFORMED TODAY.  ACTINIC DAMAGE - Chronic condition, secondary to cumulative UV/sun exposure - diffuse scaly erythematous macules with underlying dyspigmentation - Recommend daily broad spectrum sunscreen SPF 30+ to sun-exposed areas, reapply every 2 hours as needed.  - Staying in the shade or wearing long sleeves, sun glasses (UVA+UVB protection) and wide brim hats (4-inch brim around the entire circumference of the hat) are also recommended for  sun protection.  - Call for new or changing lesions.  LENTIGINES, SEBORRHEIC KERATOSES, HEMANGIOMAS - Benign normal skin lesions - Benign-appearing - Call for any changes  MELANOCYTIC NEVI - Tan-brown and/or pink-flesh-colored symmetric macules and papules - 3.0 mm speckled brown macule at left forearm - 2.5 mm medium dark brown macule at right medial calf  - Benign appearing on exam today - Observation - Call clinic for new or changing moles - Recommend daily use of broad spectrum spf 30+ sunscreen to sun-exposed areas.   DERMATOFIBROMA Exam: Firm pink/brown papulenodule with dimple sign at R pretibia, R ankle Treatment Plan: A dermatofibroma is a benign growth possibly related to trauma, such as an insect bite, cut from shaving, or inflamed acne-type bump.  Treatment options to remove include shave or excision with resulting scar and risk of recurrence.  Since benign-appearing and not bothersome, will observe for now.     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 No cure. - Observe. - If desired, patient can use an emollient (moisturizer) containing ammonium lactate (AmLactin), urea or salicylic acid once a day to smooth the area  Recommend starting moisturizer with exfoliant (Urea, Salicylic acid, or Lactic acid) one to two times daily to help smooth rough and bumpy skin.  OTC options include Cetaphil Rough and Bumpy lotion (Urea), Eucerin Roughness Relief lotion or spot treatment cream (Urea), CeraVe SA lotion/cream for Rough and Bumpy skin (Sal Acid), Gold Bond Rough and Bumpy cream (Sal Acid), and AmLactin 12% lotion/cream (Lactic Acid).  If applying in morning, also apply sunscreen to sun-exposed areas, since these exfoliating moisturizers can increase sensitivity to sun.  Insect Bite Reaction Exam: Left lower back, pink firm excoriated papule  Treatment Plan: May apply OTC HC cream prn itch, call with any changes.    OTHER VIRAL  WARTS L lateral plantar foot x1 Viral Wart (HPV) Counseling  Discussed viral / HPV (Human Papilloma Virus) etiology and risk of spread /infectivity to other areas of body as well as to other people.  Multiple treatments and methods may be required to clear warts and it is possible treatment may not be successful.  Treatment risks include discoloration; scarring and there is still potential for wart recurrence.  Discussed with patient this will require more than one treatment with cryotherapy.    Destruction of lesion - L lateral plantar foot x1  Destruction method: cryotherapy   Informed consent: discussed and consent obtained   Lesion destroyed using liquid nitrogen: Yes   Region frozen until ice ball extended beyond lesion: Yes   Outcome: patient tolerated procedure well with no complications   Post-procedure details: wound care instructions given   Additional details:  Prior to procedure, discussed risks of blister formation, small wound, skin dyspigmentation, or rare scar following cryotherapy. Recommend Vaseline ointment to treated areas while healing.  CALLUS BL plantar foot Benign, observe.   Recommend OTC Amlactin to help soften and exfoliate, massage it in apply BID.  Return in about 1 month (around 04/25/2024) for w/ Dr. Annette Barters, plantar wart f/u .  I, Jacquelynn Vera, CMA, am acting as scribe for Artemio Larry, MD .   Documentation: I have reviewed the above documentation for accuracy and completeness, and I agree with the above.  Artemio Larry, MD

## 2024-03-25 NOTE — Patient Instructions (Addendum)
 Recommend OTC Amlactin to help soften and exfoliate, massage it in and apply twice a day.    Cryotherapy Aftercare  Wash gently with soap and water everyday.   Apply Vaseline and Band-Aid daily until healed.     Recommend daily broad spectrum sunscreen SPF 30+ to sun-exposed areas, reapply every 2 hours as needed. Call for new or changing lesions.  Staying in the shade or wearing long sleeves, sun glasses (UVA+UVB protection) and wide brim hats (4-inch brim around the entire circumference of the hat) are also recommended for sun protection.    Melanoma ABCDEs  Melanoma is the most dangerous type of skin cancer, and is the leading cause of death from skin disease.  You are more likely to develop melanoma if you: Have light-colored skin, light-colored eyes, or red or blond hair Spend a lot of time in the sun Tan regularly, either outdoors or in a tanning bed Have had blistering sunburns, especially during childhood Have a close family member who has had a melanoma Have atypical moles or large birthmarks  Early detection of melanoma is key since treatment is typically straightforward and cure rates are extremely high if we catch it early.   The first sign of melanoma is often a change in a mole or a new dark spot.  The ABCDE system is a way of remembering the signs of melanoma.  A for asymmetry:  The two halves do not match. B for border:  The edges of the growth are irregular. C for color:  A mixture of colors are present instead of an even brown color. D for diameter:  Melanomas are usually (but not always) greater than 6mm - the size of a pencil eraser. E for evolution:  The spot keeps changing in size, shape, and color.  Please check your skin once per month between visits. You can use a small mirror in front and a large mirror behind you to keep an eye on the back side or your body.   If you see any new or changing lesions before your next follow-up, please call to schedule a  visit.  Please continue daily skin protection including broad spectrum sunscreen SPF 30+ to sun-exposed areas, reapplying every 2 hours as needed when you're outdoors.   Staying in the shade or wearing long sleeves, sun glasses (UVA+UVB protection) and wide brim hats (4-inch brim around the entire circumference of the hat) are also recommended for sun protection.     Due to recent changes in healthcare laws, you may see results of your pathology and/or laboratory studies on MyChart before the doctors have had a chance to review them. We understand that in some cases there may be results that are confusing or concerning to you. Please understand that not all results are received at the same time and often the doctors may need to interpret multiple results in order to provide you with the best plan of care or course of treatment. Therefore, we ask that you please give us  2 business days to thoroughly review all your results before contacting the office for clarification. Should we see a critical lab result, you will be contacted sooner.   If You Need Anything After Your Visit  If you have any questions or concerns for your doctor, please call our main line at [phone] and press option 4 to reach your doctor's medical assistant. If no one answers, please leave a voicemail as directed and we will return your call as soon as possible. Messages  left after 4 pm will be answered the following business day.   You may also send us  a message via MyChart. We typically respond to MyChart messages within 1-2 business days.  For prescription refills, please ask your pharmacy to contact our office. Our fax number is [phone].  If you have an urgent issue when the clinic is closed that cannot wait until the next business day, you can page your doctor at the number below.    Please note that while we do our best to be available for urgent issues outside of office hours, we are not available 24/7.   If  you have an urgent issue and are unable to reach us , you may choose to seek medical care at your doctor's office, retail clinic, urgent care center, or emergency room.  If you have a medical emergency, please immediately call 911 or go to the emergency department.  Pager Numbers  - Dr. Bary Likes: [phone]  - Dr. Annette Barters: [phone]  - Dr. Felipe Horton: [phone]   In the event of inclement weather, please call our main line at [phone] for an update on the status of any delays or closures.  Dermatology Medication Tips: Please keep the boxes that topical medications come in in order to help keep track of the instructions about where and how to use these. Pharmacies typically print the medication instructions only on the boxes and not directly on the medication tubes.   If your medication is too expensive, please contact our office at [phone] option 4 or send us  a message through MyChart.   We are unable to tell what your co-pay for medications will be in advance as this is different depending on your insurance coverage. However, we may be able to find a substitute medication at lower cost or fill out paperwork to get insurance to cover a needed medication.   If a prior authorization is required to get your medication covered by your insurance company, please allow us  1-2 business days to complete this process.  Drug prices often vary depending on where the prescription is filled and some pharmacies may offer cheaper prices.  The website www.goodrx.com contains coupons for medications through different pharmacies. The prices here do not account for what the cost may be with help from insurance (it may be cheaper with your insurance), but the website can give you the price if you did not use any insurance.  - You can print the associated coupon and take it with your prescription to the pharmacy.  - You may also stop by our office during regular business hours and pick up a GoodRx  coupon card.  - If you need your prescription sent electronically to a different pharmacy, notify our office through [facility] or by phone at [phone] option 4.     Si Usted Necesita Algo Despus de Su Visita  Tambin puede enviarnos un mensaje a travs de Clinical cytogeneticist. Por lo general respondemos a los mensajes de MyChart en el transcurso de 1 a 2 das hbiles.  Para renovar recetas, por favor pida a su farmacia que se ponga en contacto con nuestra oficina. Franz Jacks de fax es [place] [phone].  Si tiene un asunto urgente cuando la clnica est cerrada y que no puede esperar hasta el siguiente da hbil, puede llamar/localizar a su doctor(a) al nmero que aparece a continuacin.   Por favor, tenga en cuenta que aunque hacemos todo lo posible para estar disponibles para asuntos urgentes fuera del horario de [place], no  estamos disponibles las 24 horas del da, los 7 [address]  [address] de la [place].   Si tiene un problema urgente y no puede comunicarse con nosotros, puede optar por buscar atencin mdica  en el consultorio de su doctor(a), en una clnica privada, en un centro de atencin urgente o en una sala de emergencias.  Si tiene Engineer, drilling, por favor llame inmediatamente al 911 o vaya a la sala de emergencias.  Nmeros de bper  - Dr. Bary Likes: [phone]  - Dra. Annette Barters: [phone]  - Dr. Felipe Horton: [phone]   En caso de inclemencias del tiempo, por favor llame a Lajuan Pila principal al [phone] para una actualizacin sobre el [place] de cualquier retraso o cierre.  Consejos para la medicacin en dermatologa: Por favor, guarde las cajas en las que vienen los medicamentos de uso tpico para ayudarle a seguir las instrucciones sobre dnde y cmo usarlos. Las farmacias generalmente imprimen las instrucciones del medicamento slo en las cajas y no directamente en los tubos del [place].   Si su medicamento es muy caro, por favor, pngase en contacto con  Bettyjane Brunet llamando al [phone] y presione la opcin 4 o envenos un mensaje a travs de Clinical cytogeneticist.   No podemos decirle cul ser su copago por los medicamentos por adelantado ya que esto es diferente dependiendo de la cobertura de su seguro. Sin embargo, es posible que podamos encontrar un medicamento sustituto a Audiological scientist un formulario para que el seguro cubra el medicamento que se considera necesario.   Si se requiere una autorizacin previa para que su compaa de seguros [place] su medicamento, por favor permtanos de 1 a 2 das hbiles para completar este proceso.  Los precios de los medicamentos varan con frecuencia dependiendo del Environmental consultant de dnde se surte la receta y alguna farmacias pueden ofrecer precios ms baratos.  El sitio web www.goodrx.com tiene cupones para medicamentos de Health and safety inspector. Los precios aqu no tienen en cuenta lo que podra costar con la ayuda del seguro (puede ser ms barato con su seguro), pero el sitio web puede darle el precio si no utiliz Tourist information centre manager.  - Puede imprimir el cupn correspondiente y llevarlo con su receta a la farmacia.  - Tambin puede pasar por nuestra oficina durante el horario de atencin regular y Education officer, museum una tarjeta de cupones de GoodRx.  - Si necesita que su receta se enve electrnicamente a una farmacia diferente, informe a nuestra oficina a travs de MyChart de [place] o por telfono llamando al [phone] y presione la opcin 4.

## 2024-04-09 ENCOUNTER — Other Ambulatory Visit: Payer: Self-pay | Admitting: Internal Medicine

## 2024-04-09 DIAGNOSIS — J302 Other seasonal allergic rhinitis: Secondary | ICD-10-CM

## 2024-04-09 DIAGNOSIS — J45909 Unspecified asthma, uncomplicated: Secondary | ICD-10-CM

## 2024-04-10 NOTE — Telephone Encounter (Signed)
 Requested Prescriptions  Pending Prescriptions Disp Refills   montelukast  (SINGULAIR ) 10 MG tablet [Pharmacy Med Name: MONTELUKAST  10MG  TABLETS] 90 tablet 1    Sig: TAKE 1 TABLET(10 MG) BY MOUTH AT BEDTIME     Pulmonology:  Leukotriene Inhibitors Passed - 04/10/2024  4:48 PM      Passed - Valid encounter within last 12 months    Recent Outpatient Visits           3 months ago Asthma, unspecified asthma severity, unspecified whether complicated, unspecified whether persistent   [facility] Rockney Cid, DO       Future Appointments             In 2 weeks Artemio Larry, MD [facility]   In 2 months Rockney Cid, DO [facility], [facility]

## 2024-04-28 ENCOUNTER — Ambulatory Visit: Admitting: Dermatology

## 2024-04-28 ENCOUNTER — Encounter: Payer: Self-pay | Admitting: Dermatology

## 2024-04-28 DIAGNOSIS — B078 Other viral warts: Secondary | ICD-10-CM

## 2024-04-28 NOTE — Progress Notes (Signed)
   Follow-Up Visit   Subjective  Brenda Price is a 53 y.o. female who presents for the following: Wart at left lateral plantar foot. Tx with LN2 03/25/2024. Pt. states wart is getting better and has a little bit of  pain.   The following portions of the chart were reviewed this encounter and updated as appropriate: medications, allergies, medical history  Review of Systems:  No other skin or systemic complaints except as noted in HPI or Assessment and Plan.  Objective  Well appearing patient in no apparent distress; mood and affect are within normal limits.  Areas Examined: Left lateral plantar foot  Relevant physical exam findings are noted in the Assessment and Plan.  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  Multiple treatments and methods may be required to clear warts and it is possible treatment may not be successful.  Treatment risks include discoloration; scarring and there is still potential for wart recurrence. Destruction of lesion - Left Lateral Plantar Surface  Destruction method: cryotherapy   Informed consent: discussed and consent obtained   Debridement: hyperkeratotic portion removed with sharp debridement   Lesion destroyed using liquid nitrogen: Yes   Region frozen until ice ball extended beyond lesion: Yes   Outcome: patient tolerated procedure well with no complications   Post-procedure details: wound care instructions given   Additional details:  Prior to procedure, discussed risks of blister formation, small wound, skin dyspigmentation, or rare scar following cryotherapy. Recommend Vaseline ointment to treated areas while healing.   Destruction of lesion - Left Lateral Plantar Surface  Destruction  method: chemical removal   Informed consent: discussed and consent obtained   Timeout:  patient name, date of birth, surgical site, and procedure verified Chemical destruction method comment:  Cantharidin plus Application time:  6 hours Procedure instructions: patient instructed to wash and dry area   Procedure instructions comment:  Wash off in 4-6 hours Outcome: patient tolerated procedure well with no complications   Post-procedure details: wound care instructions given   Additional details:  Cantharidin Plus is a blistering agent that comes from a beetle.  It needs to be washed off in about 4-6 hours after application.  Although it is painless when applied in office, it may cause symptoms of mild pain and burning several hours later.  Treated areas will swell and turn red, and blisters may form.  Vaseline and a bandaid may be applied until wound has healed.  Once healed, the skin may remain temporarily discolored.  It can take weeks to months for pigmentation to return to normal.  Advised to wash off with soap and water in 4-6 hours or sooner if it becomes tender before then.       Return in about 1 month (around 05/28/2024) for Wart Follow UP.  I, Jill Parcell, CMA, am acting as scribe for Artemio Larry, MD.   Documentation: I have reviewed the above documentation for accuracy and completeness, and I agree with the above.  Artemio Larry, MD

## 2024-04-28 NOTE — Patient Instructions (Addendum)
 Cryotherapy Aftercare  Wash gently with soap and water everyday.   Apply Vaseline and Band-Aid daily until healed.   Instructions for After In-Office Application of Cantharidin  1. This is a strong medicine; please follow ALL instructions.  2. Gently wash off with soap and water in four hours or sooner s directed by your physician.  3. **WARNING** this medicine can cause severe blistering, blood blisters, infection, and/or scarring if it is not washed off as directed.  4. Your progress will be rechecked in 1-2 months; call sooner if there are any questions or problems.   Cantharidin Plus is a blistering agent that comes from a beetle.  It needs to be washed off in about 4 hours after application.  Although it is painless when applied in office, it may cause symptoms of mild pain and burning several hours later.  Treated areas will swell and turn red, and blisters may form.  Vaseline and a bandaid may be applied until wound has healed.  Once healed, the skin may remain temporarily discolored.  It can take weeks to months for pigmentation to return to normal.  Advised to wash off with soap and water in 4 hours or sooner if it becomes tender before then.       Due to recent changes in healthcare laws, you may see results of your pathology and/or laboratory studies on MyChart before the doctors have had a chance to review them. We understand that in some cases there may be results that are confusing or concerning to you. Please understand that not all results are received at the same time and often the doctors may need to interpret multiple results in order to provide you with the best plan of care or course of treatment. Therefore, we ask that you please give us  2 business days to thoroughly review all your results before contacting the office for clarification. Should we see a critical lab result, you will be contacted sooner.   If You Need Anything After Your Visit  If you have any  questions or concerns for your doctor, please call our main line at [phone] and press option 4 to reach your doctor's medical assistant. If no one answers, please leave a voicemail as directed and we will return your call as soon as possible. Messages left after 4 pm will be answered the following business day.   You may also send us  a message via MyChart. We typically respond to MyChart messages within 1-2 business days.  For prescription refills, please ask your pharmacy to contact our office. Our fax number is [phone].  If you have an urgent issue when the clinic is closed that cannot wait until the next business day, you can page your doctor at the number below.    Please note that while we do our best to be available for urgent issues outside of office hours, we are not available 24/7.   If you have an urgent issue and are unable to reach us , you may choose to seek medical care at your doctor's office, retail clinic, urgent care center, or emergency room.  If you have a medical emergency, please immediately call 911 or go to the emergency department.  Pager Numbers  - Dr. Bary Likes: [phone]  - Dr. Annette Barters: [phone]  - Dr. Felipe Horton: [phone]   In the event of inclement weather, please call our main line at [phone] for an update on the status of any delays or closures.  Dermatology Medication Tips: Please keep the  boxes that topical medications come in in order to help keep track of the instructions about where and how to use these. Pharmacies typically print the medication instructions only on the boxes and not directly on the medication tubes.   If your medication is too expensive, please contact our office at [phone] option 4 or send us  a message through MyChart.   We are unable to tell what your co-pay for medications will be in advance as this is different depending on your insurance coverage. However, we may be able to find a substitute medication at  lower cost or fill out paperwork to get insurance to cover a needed medication.   If a prior authorization is required to get your medication covered by your insurance company, please allow us  1-2 business days to complete this process.  Drug prices often vary depending on where the prescription is filled and some pharmacies may offer cheaper prices.  The website www.goodrx.com contains coupons for medications through different pharmacies. The prices here do not account for what the cost may be with help from insurance (it may be cheaper with your insurance), but the website can give you the price if you did not use any insurance.  - You can print the associated coupon and take it with your prescription to the pharmacy.  - You may also stop by our office during regular business hours and pick up a GoodRx coupon card.  - If you need your prescription sent electronically to a different pharmacy, notify our office through [facility] or by phone at [phone] option 4.     Si Usted Necesita Algo Despus de Su Visita  Tambin puede enviarnos un mensaje a travs de Clinical cytogeneticist. Por lo general respondemos a los mensajes de MyChart en el transcurso de 1 a 2 das hbiles.  Para renovar recetas, por favor pida a su farmacia que se ponga en contacto con nuestra oficina. Franz Jacks de fax es [place] [phone].  Si tiene un asunto urgente cuando la clnica est cerrada y que no puede esperar hasta el siguiente da hbil, puede llamar/localizar a su doctor(a) al nmero que aparece a continuacin.   Por favor, tenga en cuenta que aunque hacemos todo lo posible para estar disponibles para asuntos urgentes fuera del horario de [place], no estamos disponibles las 24 horas del da, los 7 [address]  [address] de la [place].   Si tiene un problema urgente y no puede comunicarse con nosotros, puede optar por buscar atencin mdica  en el consultorio de su doctor(a), en una clnica privada, en un centro de atencin urgente  o en una sala de emergencias.  Si tiene Engineer, drilling, por favor llame inmediatamente al 911 o vaya a la sala de emergencias.  Nmeros de bper  - Dr. Bary Likes: [phone]  - Dra. Annette Barters: [phone]  - Dr. Felipe Horton: [phone]   En caso de inclemencias del tiempo, por favor llame a Lajuan Pila principal al [phone] para una actualizacin sobre el [place] de cualquier retraso o cierre.  Consejos para la medicacin en dermatologa: Por favor, guarde las cajas en las que vienen los medicamentos de uso tpico para ayudarle a seguir las instrucciones sobre dnde y cmo usarlos. Las farmacias generalmente imprimen las instrucciones del medicamento slo en las cajas y no directamente en los tubos del [place].   Si su medicamento es muy caro, por favor, pngase en contacto con Bettyjane Brunet llamando al [phone] y presione la opcin 4 o envenos un mensaje a travs de Clinical cytogeneticist.  No podemos decirle cul ser su copago por los medicamentos por adelantado ya que esto es diferente dependiendo de la cobertura de su seguro. Sin embargo, es posible que podamos encontrar un medicamento sustituto a Audiological scientist un formulario para que el seguro cubra el medicamento que se considera necesario.   Si se requiere una autorizacin previa para que su compaa de seguros [place] su medicamento, por favor permtanos de 1 a 2 das hbiles para completar este proceso.  Los precios de los medicamentos varan con frecuencia dependiendo del Environmental consultant de dnde se surte la receta y alguna farmacias pueden ofrecer precios ms baratos.  El sitio web www.goodrx.com tiene cupones para medicamentos de Health and safety inspector. Los precios aqu no tienen en cuenta lo que podra costar con la ayuda del seguro (puede ser ms barato con su seguro), pero el sitio web puede darle el precio si no utiliz Tourist information centre manager.  - Puede imprimir el cupn correspondiente y llevarlo con su receta a la farmacia.  -  Tambin puede pasar por nuestra oficina durante el horario de atencin regular y Education officer, museum una tarjeta de cupones de GoodRx.  - Si necesita que su receta se enve electrnicamente a una farmacia diferente, informe a nuestra oficina a travs de MyChart de [place] o por telfono llamando al [phone] y presione la opcin 4.

## 2024-04-29 ENCOUNTER — Other Ambulatory Visit: Payer: Self-pay | Admitting: Internal Medicine

## 2024-04-29 ENCOUNTER — Encounter: Payer: Self-pay | Admitting: Internal Medicine

## 2024-04-29 DIAGNOSIS — E039 Hypothyroidism, unspecified: Secondary | ICD-10-CM

## 2024-04-30 MED ORDER — LEVOTHYROXINE SODIUM 50 MCG PO TABS: 50.0000 ug | ORAL_TABLET | Freq: Every day | ORAL | 0 refills | Status: DC

## 2024-05-01 NOTE — Telephone Encounter (Signed)
 Requested Prescriptions  Refused Prescriptions Disp Refills   levothyroxine  (SYNTHROID ) 50 MCG tablet [Pharmacy Med Name: Levothyroxine  Sodium 50 MCG Oral Tablet] 90 tablet 3    Sig: TAKE 1 TABLET BY MOUTH DAILY     Endocrinology:  Hypothyroid Agents Passed - 05/01/2024  3:20 PM      Passed - TSH in normal range and within 360 days    TSH  Date Value Ref Range Status  02/10/2024 2.240 0.450 - 4.500 uIU/mL Final         Passed - Valid encounter within last 12 months    Recent Outpatient Visits           4 months ago Asthma, unspecified asthma severity, unspecified whether complicated, unspecified whether persistent   [facility] Rockney Cid, DO       Future Appointments             In 3 weeks Artemio Larry, MD [facility]   In 2 months Rockney Cid, DO [facility], [facility]

## 2024-05-26 ENCOUNTER — Ambulatory Visit: Admitting: Dermatology

## 2024-06-30 ENCOUNTER — Ambulatory Visit: Payer: Managed Care, Other (non HMO) | Admitting: Internal Medicine

## 2024-06-30 ENCOUNTER — Encounter

## 2024-07-01 ENCOUNTER — Ambulatory Visit: Admitting: Dermatology

## 2024-07-25 ENCOUNTER — Other Ambulatory Visit: Payer: Self-pay | Admitting: Internal Medicine

## 2024-07-25 DIAGNOSIS — E039 Hypothyroidism, unspecified: Secondary | ICD-10-CM

## 2024-07-27 NOTE — Telephone Encounter (Signed)
 Requested Prescriptions  Pending Prescriptions Disp Refills   levothyroxine  (SYNTHROID ) 50 MCG tablet [Pharmacy Med Name: LEVOTHYROXINE  0.05MG  ( ) TAB] 90 tablet 0    Sig: TAKE 1 TABLET(50 MCG) BY MOUTH DAILY     Endocrinology:  Hypothyroid Agents Passed - 07/27/2024  4:30 PM      Passed - TSH in normal range and within 360 days    TSH  Date Value Ref Range Status  02/10/2024 2.240 0.450 - 4.500 uIU/mL Final         Passed - Valid encounter within last 12 months    Recent Outpatient Visits           7 months ago Asthma, unspecified asthma severity, unspecified whether complicated, unspecified whether persistent   [facility] Bernardo Fend, DO       Future Appointments             In 2 months Jackquline Sawyer, MD [facility]

## 2024-08-05 ENCOUNTER — Ambulatory Visit: Admitting: Internal Medicine

## 2024-08-13 ENCOUNTER — Ambulatory Visit: Admitting: Internal Medicine

## 2024-08-24 ENCOUNTER — Other Ambulatory Visit: Payer: Self-pay | Admitting: Internal Medicine

## 2024-08-24 DIAGNOSIS — J302 Other seasonal allergic rhinitis: Secondary | ICD-10-CM

## 2024-08-25 NOTE — Telephone Encounter (Signed)
 Requested Prescriptions  Pending Prescriptions Disp Refills   levocetirizine (XYZAL ) 5 MG tablet [Pharmacy Med Name: LEVOCETIRIZINE 5MG  TABLETS] 90 tablet 0    Sig: TAKE 1 TABLET BY MOUTH IN THE EVENING     Ear, Nose, and Throat:  Antihistamines - levocetirizine dihydrochloride  Passed - 08/25/2024  4:01 PM      Passed - Cr in normal range and within 360 days    Creatinine, Ser  Date Value Ref Range Status  02/10/2024 0.92 0.57 - 1.00 mg/dL Final         Passed - eGFR is 10 or above and within 360 days    GFR calc Af Amer  Date Value Ref Range Status  11/09/2020 104 >59 mL/min/1.73 Final    Comment:    **In accordance with recommendations from the NKF-ASN Task force,**   [facility] is in the process of updating its eGFR calculation to the   2021 CKD-EPI creatinine equation that estimates kidney function   without a race variable.    GFR calc non Af Amer  Date Value Ref Range Status  11/09/2020 90 >59 mL/min/1.73 Final   eGFR  Date Value Ref Range Status  02/10/2024 75 >59 mL/min/1.73 Final         Passed - Valid encounter within last 12 months    Recent Outpatient Visits           8 months ago Asthma, unspecified asthma severity, unspecified whether complicated, unspecified whether persistent   [facility] Bernardo Fend, DO       Future Appointments             In 1 month Jackquline Sawyer, MD [facility]

## 2024-10-05 ENCOUNTER — Ambulatory Visit
Admission: RE | Admit: 2024-10-05 | Discharge: 2024-10-05 | Disposition: A | Discharge status: Home / Self Care | Source: Ambulatory Visit | Attending: Internal Medicine | Admitting: Internal Medicine

## 2024-10-05 DIAGNOSIS — Z1231 Encounter for screening mammogram for malignant neoplasm of breast: Secondary | ICD-10-CM | POA: Diagnosis present

## 2024-10-08 ENCOUNTER — Other Ambulatory Visit: Payer: Self-pay | Admitting: Internal Medicine

## 2024-10-08 DIAGNOSIS — J302 Other seasonal allergic rhinitis: Secondary | ICD-10-CM

## 2024-10-08 DIAGNOSIS — J45909 Unspecified asthma, uncomplicated: Secondary | ICD-10-CM

## 2024-10-12 ENCOUNTER — Ambulatory Visit: Payer: Self-pay | Admitting: Internal Medicine

## 2024-10-13 NOTE — Telephone Encounter (Signed)
 Requested Prescriptions  Pending Prescriptions Disp Refills   montelukast  (SINGULAIR ) 10 MG tablet [Pharmacy Med Name: MONTELUKAST  10MG  TABLETS] 90 tablet 0    Sig: TAKE 1 TABLET(10 MG) BY MOUTH AT BEDTIME     Pulmonology:  Leukotriene Inhibitors Passed - 10/13/2024 11:35 AM      Passed - Valid encounter within last 12 months    Recent Outpatient Visits           9 months ago Asthma, unspecified asthma severity, unspecified whether complicated, unspecified whether persistent   [facility] Bernardo Fend, DO       Future Appointments             In 6 days Jackquline Sawyer, MD [facility]

## 2024-10-19 ENCOUNTER — Encounter: Payer: Self-pay | Admitting: Dermatology

## 2024-10-19 ENCOUNTER — Ambulatory Visit: Admitting: Dermatology

## 2024-10-19 DIAGNOSIS — B070 Plantar wart: Secondary | ICD-10-CM | POA: Diagnosis not present

## 2024-10-19 DIAGNOSIS — Z7189 Other specified counseling: Secondary | ICD-10-CM

## 2024-10-19 NOTE — Patient Instructions (Addendum)
 Cryotherapy Aftercare  Wash gently with soap and water everyday.   Apply Vaseline and Band-Aid daily until healed.     Instructions for After In-Office Application of Cantharidin  1. This is a strong medicine; please follow ALL instructions.  2. Gently wash off with soap and water in six hours or sooner s directed by your physician.  3. **WARNING** this medicine can cause severe blistering, blood blisters, infection, and/or scarring if it is not washed off as directed.  4. Your progress will be rechecked in 1-2 months; call sooner if there are any questions or problems.   Due to recent changes in healthcare laws, you may see results of your pathology and/or laboratory studies on MyChart before the doctors have had a chance to review them. We understand that in some cases there may be results that are confusing or concerning to you. Please understand that not all results are received at the same time and often the doctors may need to interpret multiple results in order to provide you with the best plan of care or course of treatment. Therefore, we ask that you please give us  2 business days to thoroughly review all your results before contacting the office for clarification. Should we see a critical lab result, you will be contacted sooner.   If You Need Anything After Your Visit  If you have any questions or concerns for your doctor, please call our main line at [phone] and press option 4 to reach your doctor's medical assistant. If no one answers, please leave a voicemail as directed and we will return your call as soon as possible. Messages left after 4 pm will be answered the following business day.   You may also send us  a message via MyChart. We typically respond to MyChart messages within 1-2 business days.  For prescription refills, please ask your pharmacy to contact our office. Our fax number is [phone].  If you have an urgent issue when the clinic is closed that  cannot wait until the next business day, you can page your doctor at the number below.    Please note that while we do our best to be available for urgent issues outside of office hours, we are not available 24/7.   If you have an urgent issue and are unable to reach us , you may choose to seek medical care at your doctor's office, retail clinic, urgent care center, or emergency room.  If you have a medical emergency, please immediately call 911 or go to the emergency department.  Pager Numbers  - Dr. Hester: [phone]  - Dr. Jackquline: [phone]  - Dr. Claudene: [phone]   - Dr. Raymund: [phone]  In the event of inclement weather, please call our main line at [phone] for an update on the status of any delays or closures.  Dermatology Medication Tips: Please keep the boxes that topical medications come in in order to help keep track of the instructions about where and how to use these. Pharmacies typically print the medication instructions only on the boxes and not directly on the medication tubes.   If your medication is too expensive, please contact our office at [phone] option 4 or send us  a message through MyChart.   We are unable to tell what your co-pay for medications will be in advance as this is different depending on your insurance coverage. However, we may be able to find a substitute medication at lower cost or fill out paperwork to get insurance to cover a  needed medication.   If a prior authorization is required to get your medication covered by your insurance company, please allow us  1-2 business days to complete this process.  Drug prices often vary depending on where the prescription is filled and some pharmacies may offer cheaper prices.  The website www.goodrx.com contains coupons for medications through different pharmacies. The prices here do not account for what the cost may be with help from insurance (it may be cheaper with your insurance),  but the website can give you the price if you did not use any insurance.  - You can print the associated coupon and take it with your prescription to the pharmacy.  - You may also stop by our office during regular business hours and pick up a GoodRx coupon card.  - If you need your prescription sent electronically to a different pharmacy, notify our office through [facility] or by phone at [phone] option 4.     Si Usted Necesita Algo Despus de Su Visita  Tambin puede enviarnos un mensaje a travs de Clinical Cytogeneticist. Por lo general respondemos a los mensajes de MyChart en el transcurso de 1 a 2 das hbiles.  Para renovar recetas, por favor pida a su farmacia que se ponga en contacto con nuestra oficina. Randi lakes de fax es [place] [phone].  Si tiene un asunto urgente cuando la clnica est cerrada y que no puede esperar hasta el siguiente da hbil, puede llamar/localizar a su doctor(a) al nmero que aparece a continuacin.   Por favor, tenga en cuenta que aunque hacemos todo lo posible para estar disponibles para asuntos urgentes fuera del horario de [place], no estamos disponibles las 24 horas del da, los 7 [address]  [address] de la [place].   Si tiene un problema urgente y no puede comunicarse con nosotros, puede optar por buscar atencin mdica  en el consultorio de su doctor(a), en una clnica privada, en un centro de atencin urgente o en una sala de emergencias.  Si tiene engineer, drilling, por favor llame inmediatamente al 911 o vaya a la sala de emergencias.  Nmeros de bper  - Dr. Hester: [phone]  - Dra. Jackquline: [phone]  - Dr. Claudene: [phone]  - Dra. Kitts: [phone]  En caso de inclemencias del [place], por favor llame a nuestra lnea principal al [phone] para una actualizacin sobre el estado de cualquier retraso o cierre.  Consejos para la medicacin en dermatologa: Por favor, guarde las cajas en las que vienen los medicamentos de uso tpico  para ayudarle a seguir las instrucciones sobre dnde y cmo usarlos. Las farmacias generalmente imprimen las instrucciones del medicamento slo en las cajas y no directamente en los tubos del [place].   Si su medicamento es muy caro, por favor, pngase en contacto con landry rieger llamando al [phone] y presione la opcin 4 o envenos un mensaje a travs de Clinical Cytogeneticist.   No podemos decirle cul ser su copago por los medicamentos por adelantado ya que esto es diferente dependiendo de la cobertura de su seguro. Sin embargo, es posible que podamos encontrar un medicamento sustituto a audiological scientist un formulario para que el seguro cubra el medicamento que se considera necesario.   Si se requiere una autorizacin previa para que su compaa de seguros cubra su medicamento, por favor permtanos de 1 a 2 das hbiles para completar este proceso.  Los precios de los medicamentos varan con frecuencia dependiendo del environmental consultant de dnde se surte la receta y careers adviser pueden  ofrecer precios ms baratos.  El sitio web www.goodrx.com tiene cupones para medicamentos de health and safety inspector. Los precios aqu no tienen en cuenta lo que podra costar con la ayuda del seguro (puede ser ms barato con su seguro), pero el sitio web puede darle el precio si no utiliz tourist information centre manager.  - Puede imprimir el cupn correspondiente y llevarlo con su receta a la farmacia.  - Tambin puede pasar por nuestra oficina durante el horario de atencin regular y education officer, museum una tarjeta de cupones de GoodRx.  - Si necesita que su receta se enve electrnicamente a una farmacia diferente, informe a nuestra oficina a travs de MyChart de [place] o por telfono llamando al [phone] y presione la opcin 4.

## 2024-10-19 NOTE — Progress Notes (Signed)
   Follow-Up Visit   Subjective  Brenda Price is a 53 y.o. female who presents for the following: wart L lat plantar surface, f/u improved   The following portions of the chart were reviewed this encounter and updated as appropriate: medications, allergies, medical history  Review of Systems:  No other skin or systemic complaints except as noted in HPI or Assessment and Plan.  Objective  Well appearing patient in no apparent distress; mood and affect are within normal limits.   A focused examination was performed of the following areas: L foot  Relevant exam findings are noted in the Assessment and Plan.  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  Multiple treatments and methods may be required to clear warts and it is possible treatment may not be successful.  Treatment risks include discoloration; scarring and there is still potential for wart recurrence. Destruction of lesion - L lat plantar surface  Destruction method: cryotherapy   Informed consent: discussed and consent obtained   Lesion destroyed using liquid nitrogen: Yes   Region frozen until ice ball extended beyond lesion: Yes   Outcome: patient tolerated procedure well with no complications   Post-procedure details: wound care instructions given   Additional details:  Prior to procedure, discussed risks of blister formation, small wound, skin dyspigmentation, or rare scar following cryotherapy. Recommend Vaseline ointment to treated areas while healing.   Destruction of lesion - L lat plantar surface  Destruction method: chemical removal   Informed consent: discussed and consent obtained   Timeout:  patient name, date of birth, surgical site, and procedure  verified Chemical destruction method comment:  Cantharidin plus Application time:  6 hours Procedure instructions: patient instructed to wash and dry area   Outcome: patient tolerated procedure well with no complications   Post-procedure details: wound care instructions given   Additional details:  Patient advised to set alarm to remind them to wash off with soap and water at the directed time. Cantharidin Plus is a blistering agent that comes from a beetle.  It needs to be washed off in about 4-6 hours after application.  Although it is painless when applied in office, it may cause symptoms of mild pain and burning several hours later.  Treated areas will swell and turn red, and blisters may form.  Vaseline and a bandaid may be applied until wound has healed.  Once healed, the skin may remain temporarily discolored.  It can take weeks to months for pigmentation to return to normal.  Advised to wash off with soap and water in 6 hours or sooner if it becomes tender before then.    Return for 3-5 wks wart f/u.  I, Grayce Saunas, RMA, am acting as scribe for Rexene Rattler, MD .   Documentation: I have reviewed the above documentation for accuracy and completeness, and I agree with the above.  Rexene Rattler, MD

## 2024-10-21 ENCOUNTER — Other Ambulatory Visit: Payer: Self-pay | Admitting: Internal Medicine

## 2024-10-21 DIAGNOSIS — E039 Hypothyroidism, unspecified: Secondary | ICD-10-CM

## 2024-10-23 NOTE — Telephone Encounter (Signed)
 Requested Prescriptions  Pending Prescriptions Disp Refills   levothyroxine  (SYNTHROID ) 50 MCG tablet [Pharmacy Med Name: LEVOTHYROXINE  0.05MG  ( ) TAB] 90 tablet 0    Sig: TAKE 1 TABLET(50 MCG) BY MOUTH DAILY     Endocrinology:  Hypothyroid Agents Passed - 10/23/2024 11:49 AM      Passed - TSH in normal range and within 360 days    TSH  Date Value Ref Range Status  02/10/2024 2.240 0.450 - 4.500 uIU/mL Final         Passed - Valid encounter within last 12 months    Recent Outpatient Visits           10 months ago Asthma, unspecified asthma severity, unspecified whether complicated, unspecified whether persistent   [facility] Bernardo Fend, DO       Future Appointments             In 1 month Jackquline Sawyer, MD [facility]

## 2024-11-22 ENCOUNTER — Other Ambulatory Visit: Payer: Self-pay | Admitting: Internal Medicine

## 2024-11-22 DIAGNOSIS — J302 Other seasonal allergic rhinitis: Secondary | ICD-10-CM

## 2024-11-23 ENCOUNTER — Encounter: Payer: Self-pay | Admitting: Internal Medicine

## 2024-11-23 ENCOUNTER — Other Ambulatory Visit: Payer: Self-pay

## 2024-11-23 DIAGNOSIS — J302 Other seasonal allergic rhinitis: Secondary | ICD-10-CM

## 2024-11-23 MED ORDER — LEVOCETIRIZINE DIHYDROCHLORIDE 5 MG PO TABS: 5.0000 mg | ORAL_TABLET | Freq: Every evening | ORAL | 0 refills | Status: AC

## 2024-11-23 NOTE — Telephone Encounter (Signed)
 Requested Prescriptions  Refused Prescriptions Disp Refills   levocetirizine (XYZAL ) 5 MG tablet [Pharmacy Med Name: LEVOCETIRIZINE 5MG  TABLETS] 90 tablet 0    Sig: TAKE 1 TABLET BY MOUTH IN THE EVENING     Ear, Nose, and Throat:  Antihistamines - levocetirizine dihydrochloride  Passed - 11/23/2024  4:30 PM      Passed - Cr in normal range and within 360 days    Creatinine, Ser  Date Value Ref Range Status  02/10/2024 0.92 0.57 - 1.00 mg/dL Final         Passed - eGFR is 10 or above and within 360 days    GFR calc Af Amer  Date Value Ref Range Status  11/09/2020 104 >59 mL/min/1.73 Final    Comment:    **In accordance with recommendations from the NKF-ASN Task force,**   [facility] is in the process of updating its eGFR calculation to the   2021 CKD-EPI creatinine equation that estimates kidney function   without a race variable.    GFR calc non Af Amer  Date Value Ref Range Status  11/09/2020 90 >59 mL/min/1.73 Final   eGFR  Date Value Ref Range Status  02/10/2024 75 >59 mL/min/1.73 Final         Passed - Valid encounter within last 12 months    Recent Outpatient Visits           11 months ago Asthma, unspecified asthma severity, unspecified whether complicated, unspecified whether persistent   [facility] Bernardo Fend, [place]

## 2024-11-25 ENCOUNTER — Ambulatory Visit: Admitting: Dermatology
